# Patient Record
Sex: Female | Born: 1960 | Race: Black or African American | Hispanic: No | Marital: Single | Smoking: Never smoker
Health system: Southern US, Community
[De-identification: ages and names within clinical notes are randomized; demographics above are authoritative.]

## PROBLEM LIST (undated history)

## (undated) DIAGNOSIS — M549 Dorsalgia, unspecified: Secondary | ICD-10-CM

## (undated) DIAGNOSIS — I10 Essential (primary) hypertension: Secondary | ICD-10-CM

## (undated) DIAGNOSIS — K0889 Other specified disorders of teeth and supporting structures: Secondary | ICD-10-CM

## (undated) DIAGNOSIS — O1490 Unspecified pre-eclampsia, unspecified trimester: Secondary | ICD-10-CM

## (undated) DIAGNOSIS — K5900 Constipation, unspecified: Secondary | ICD-10-CM

## (undated) DIAGNOSIS — E785 Hyperlipidemia, unspecified: Secondary | ICD-10-CM

## (undated) DIAGNOSIS — E669 Obesity, unspecified: Secondary | ICD-10-CM

## (undated) DIAGNOSIS — I4891 Unspecified atrial fibrillation: Secondary | ICD-10-CM

## (undated) DIAGNOSIS — R7303 Prediabetes: Secondary | ICD-10-CM

## (undated) DIAGNOSIS — J45909 Unspecified asthma, uncomplicated: Secondary | ICD-10-CM

## (undated) DIAGNOSIS — R079 Chest pain, unspecified: Secondary | ICD-10-CM

## (undated) DIAGNOSIS — G510 Bell's palsy: Secondary | ICD-10-CM

## (undated) DIAGNOSIS — K219 Gastro-esophageal reflux disease without esophagitis: Secondary | ICD-10-CM

## (undated) DIAGNOSIS — R7989 Other specified abnormal findings of blood chemistry: Secondary | ICD-10-CM

## (undated) DIAGNOSIS — Z6841 Body Mass Index (BMI) 40.0 and over, adult: Secondary | ICD-10-CM

## (undated) DIAGNOSIS — S0230XA Fracture of orbital floor, unspecified side, initial encounter for closed fracture: Secondary | ICD-10-CM

## (undated) HISTORY — PX: COLONOSCOPY: SHX174

## (undated) HISTORY — DX: Bell's palsy: G51.0

## (undated) HISTORY — DX: Unspecified pre-eclampsia, unspecified trimester: O14.90

## (undated) HISTORY — DX: Essential (primary) hypertension: I10

## (undated) HISTORY — PX: BACK SURGERY: SHX140

## (undated) HISTORY — DX: Hyperlipidemia, unspecified: E78.5

## (undated) HISTORY — DX: Chest pain, unspecified: R07.9

## (undated) HISTORY — DX: Constipation, unspecified: K59.00

## (undated) HISTORY — DX: Prediabetes: R73.03

## (undated) HISTORY — PX: VAGINAL HYSTERECTOMY: SHX2639

## (undated) HISTORY — DX: Fracture of orbital floor, unspecified side, initial encounter for closed fracture: S02.30XA

## (undated) HISTORY — DX: Body Mass Index (BMI) 40.0 and over, adult: Z684

## (undated) HISTORY — DX: Unspecified asthma, uncomplicated: J45.909

## (undated) HISTORY — DX: Gastro-esophageal reflux disease without esophagitis: K21.9

## (undated) HISTORY — DX: Dorsalgia, unspecified: M54.9

## (undated) HISTORY — DX: Other specified abnormal findings of blood chemistry: R79.89

## (undated) HISTORY — PX: KNEE ARTHROSCOPY: SUR90

## (undated) HISTORY — DX: Obesity, unspecified: E66.9

## (undated) HISTORY — DX: Other specified disorders of teeth and supporting structures: K08.89

---

## 1997-11-26 ENCOUNTER — Emergency Department (HOSPITAL_COMMUNITY): Admission: EM | Admit: 1997-11-26 | Discharge: 1997-11-26 | Payer: Self-pay | Admitting: Emergency Medicine

## 1998-05-13 ENCOUNTER — Emergency Department (HOSPITAL_COMMUNITY): Admission: EM | Admit: 1998-05-13 | Discharge: 1998-05-13 | Payer: Self-pay | Admitting: Emergency Medicine

## 1998-08-29 ENCOUNTER — Emergency Department (HOSPITAL_COMMUNITY): Admission: EM | Admit: 1998-08-29 | Discharge: 1998-08-29 | Payer: Self-pay | Admitting: Emergency Medicine

## 1998-08-30 ENCOUNTER — Emergency Department (HOSPITAL_COMMUNITY): Admission: EM | Admit: 1998-08-30 | Discharge: 1998-08-31 | Payer: Self-pay | Admitting: Emergency Medicine

## 1998-10-27 ENCOUNTER — Other Ambulatory Visit: Admission: RE | Admit: 1998-10-27 | Discharge: 1998-10-27 | Payer: Self-pay | Admitting: Obstetrics and Gynecology

## 1998-12-15 ENCOUNTER — Encounter: Payer: Self-pay | Admitting: Occupational Medicine

## 1998-12-15 ENCOUNTER — Ambulatory Visit: Admission: RE | Admit: 1998-12-15 | Discharge: 1998-12-15 | Payer: Self-pay | Admitting: Occupational Medicine

## 1999-01-20 ENCOUNTER — Encounter: Payer: Self-pay | Admitting: Obstetrics and Gynecology

## 1999-01-20 ENCOUNTER — Encounter: Admission: RE | Admit: 1999-01-20 | Discharge: 1999-01-20 | Payer: Self-pay | Admitting: Obstetrics and Gynecology

## 1999-02-13 ENCOUNTER — Emergency Department (HOSPITAL_COMMUNITY): Admission: EM | Admit: 1999-02-13 | Discharge: 1999-02-13 | Payer: Self-pay | Admitting: Emergency Medicine

## 1999-02-13 ENCOUNTER — Encounter: Payer: Self-pay | Admitting: Emergency Medicine

## 1999-06-27 ENCOUNTER — Emergency Department (HOSPITAL_COMMUNITY): Admission: EM | Admit: 1999-06-27 | Discharge: 1999-06-27 | Payer: Self-pay

## 2000-07-15 ENCOUNTER — Emergency Department (HOSPITAL_COMMUNITY): Admission: EM | Admit: 2000-07-15 | Discharge: 2000-07-15 | Payer: Self-pay | Admitting: Emergency Medicine

## 2000-10-16 ENCOUNTER — Emergency Department (HOSPITAL_COMMUNITY): Admission: EM | Admit: 2000-10-16 | Discharge: 2000-10-16 | Payer: Self-pay | Admitting: *Deleted

## 2000-11-06 ENCOUNTER — Other Ambulatory Visit: Admission: RE | Admit: 2000-11-06 | Discharge: 2000-11-06 | Payer: Self-pay | Admitting: Obstetrics and Gynecology

## 2001-10-26 ENCOUNTER — Emergency Department (HOSPITAL_COMMUNITY): Admission: EM | Admit: 2001-10-26 | Discharge: 2001-10-26 | Payer: Self-pay | Admitting: Emergency Medicine

## 2001-10-26 ENCOUNTER — Encounter: Payer: Self-pay | Admitting: Emergency Medicine

## 2002-02-09 ENCOUNTER — Emergency Department (HOSPITAL_COMMUNITY): Admission: EM | Admit: 2002-02-09 | Discharge: 2002-02-09 | Payer: Self-pay

## 2003-05-13 ENCOUNTER — Emergency Department (HOSPITAL_COMMUNITY): Admission: EM | Admit: 2003-05-13 | Discharge: 2003-05-14 | Payer: Self-pay | Admitting: Emergency Medicine

## 2004-01-23 ENCOUNTER — Emergency Department (HOSPITAL_COMMUNITY): Admission: EM | Admit: 2004-01-23 | Discharge: 2004-01-24 | Payer: Self-pay | Admitting: *Deleted

## 2004-05-16 ENCOUNTER — Encounter (INDEPENDENT_AMBULATORY_CARE_PROVIDER_SITE_OTHER): Payer: Self-pay | Admitting: *Deleted

## 2004-05-16 ENCOUNTER — Inpatient Hospital Stay (HOSPITAL_COMMUNITY): Admission: RE | Admit: 2004-05-16 | Discharge: 2004-05-18 | Payer: Self-pay | Admitting: Obstetrics and Gynecology

## 2004-05-21 ENCOUNTER — Inpatient Hospital Stay (HOSPITAL_COMMUNITY): Admission: AD | Admit: 2004-05-21 | Discharge: 2004-05-21 | Payer: Self-pay | Admitting: *Deleted

## 2004-11-21 ENCOUNTER — Encounter: Admission: RE | Admit: 2004-11-21 | Discharge: 2004-11-21 | Payer: Self-pay | Admitting: Orthopedic Surgery

## 2004-12-12 ENCOUNTER — Encounter: Admission: RE | Admit: 2004-12-12 | Discharge: 2004-12-12 | Payer: Self-pay | Admitting: Orthopedic Surgery

## 2005-06-19 ENCOUNTER — Emergency Department (HOSPITAL_COMMUNITY): Admission: EM | Admit: 2005-06-19 | Discharge: 2005-06-19 | Payer: Self-pay | Admitting: Emergency Medicine

## 2006-04-24 ENCOUNTER — Emergency Department (HOSPITAL_COMMUNITY): Admission: EM | Admit: 2006-04-24 | Discharge: 2006-04-24 | Payer: Self-pay | Admitting: Emergency Medicine

## 2006-04-28 ENCOUNTER — Emergency Department (HOSPITAL_COMMUNITY): Admission: EM | Admit: 2006-04-28 | Discharge: 2006-04-28 | Payer: Self-pay | Admitting: Family Medicine

## 2006-11-20 ENCOUNTER — Encounter: Admission: RE | Admit: 2006-11-20 | Discharge: 2006-11-20 | Payer: Self-pay | Admitting: Family Medicine

## 2008-05-24 ENCOUNTER — Encounter: Admission: RE | Admit: 2008-05-24 | Discharge: 2008-05-24 | Payer: Self-pay | Admitting: Family Medicine

## 2008-07-29 ENCOUNTER — Emergency Department (HOSPITAL_COMMUNITY): Admission: EM | Admit: 2008-07-29 | Discharge: 2008-07-29 | Payer: Self-pay | Admitting: Family Medicine

## 2009-04-03 ENCOUNTER — Emergency Department (HOSPITAL_COMMUNITY): Admission: EM | Admit: 2009-04-03 | Discharge: 2009-04-03 | Payer: Self-pay | Admitting: Emergency Medicine

## 2009-04-07 ENCOUNTER — Encounter: Admission: RE | Admit: 2009-04-07 | Discharge: 2009-04-07 | Payer: Self-pay | Admitting: Family Medicine

## 2009-05-11 ENCOUNTER — Encounter: Admission: RE | Admit: 2009-05-11 | Discharge: 2009-05-11 | Payer: Self-pay | Admitting: Family Medicine

## 2009-05-30 ENCOUNTER — Encounter: Admission: RE | Admit: 2009-05-30 | Discharge: 2009-05-30 | Payer: Self-pay | Admitting: Family Medicine

## 2009-08-14 ENCOUNTER — Emergency Department (HOSPITAL_COMMUNITY): Admission: EM | Admit: 2009-08-14 | Discharge: 2009-08-15 | Payer: Self-pay | Admitting: Emergency Medicine

## 2010-02-15 ENCOUNTER — Inpatient Hospital Stay (HOSPITAL_COMMUNITY)
Admission: RE | Admit: 2010-02-15 | Discharge: 2010-02-18 | Payer: Self-pay | Source: Home / Self Care | Attending: Orthopaedic Surgery | Admitting: Orthopaedic Surgery

## 2010-03-26 ENCOUNTER — Encounter: Payer: Self-pay | Admitting: Obstetrics and Gynecology

## 2010-05-15 LAB — CBC
HCT: 32.8 % — ABNORMAL LOW (ref 36.0–46.0)
HCT: 33.8 % — ABNORMAL LOW (ref 36.0–46.0)
HCT: 42.2 % (ref 36.0–46.0)
Hemoglobin: 10.8 g/dL — ABNORMAL LOW (ref 12.0–15.0)
Hemoglobin: 10.9 g/dL — ABNORMAL LOW (ref 12.0–15.0)
Hemoglobin: 13.5 g/dL (ref 12.0–15.0)
MCH: 30.4 pg (ref 26.0–34.0)
MCH: 30.7 pg (ref 26.0–34.0)
MCH: 29.6 pg (ref 26.0–34.0)
MCHC: 32.2 g/dL (ref 30.0–36.0)
MCHC: 32.9 g/dL (ref 30.0–36.0)
MCHC: 32 g/dL (ref 30.0–36.0)
MCV: 93.2 fL (ref 78.0–100.0)
MCV: 94.4 fL (ref 78.0–100.0)
MCV: 92.5 fL (ref 78.0–100.0)
Platelets: 239 10*3/uL (ref 150–400)
Platelets: 243 10*3/uL (ref 150–400)
Platelets: 327 10*3/uL (ref 150–400)
RBC: 3.52 MIL/uL — ABNORMAL LOW (ref 3.87–5.11)
RBC: 3.58 MIL/uL — ABNORMAL LOW (ref 3.87–5.11)
RBC: 4.56 MIL/uL (ref 3.87–5.11)
RDW: 12.5 % (ref 11.5–15.5)
RDW: 12.5 % (ref 11.5–15.5)
RDW: 12.5 % (ref 11.5–15.5)
WBC: 13.1 10*3/uL — ABNORMAL HIGH (ref 4.0–10.5)
WBC: 13.9 10*3/uL — ABNORMAL HIGH (ref 4.0–10.5)
WBC: 5.6 10*3/uL (ref 4.0–10.5)

## 2010-05-15 LAB — COMPREHENSIVE METABOLIC PANEL
ALT: 31 U/L (ref 0–35)
AST: 23 U/L (ref 0–37)
Albumin: 3.7 g/dL (ref 3.5–5.2)
Alkaline Phosphatase: 57 U/L (ref 39–117)
BUN: 15 mg/dL (ref 6–23)
CO2: 24 mEq/L (ref 19–32)
Calcium: 9.6 mg/dL (ref 8.4–10.5)
Chloride: 103 mEq/L (ref 96–112)
Creatinine, Ser: 0.86 mg/dL (ref 0.4–1.2)
GFR calc Af Amer: >60 mL/min (ref >60)
GFR calc non Af Amer: >60 mL/min (ref >60)
Glucose, Bld: 90 mg/dL (ref 70–99)
Potassium: 4.6 mEq/L (ref 3.5–5.1)
Sodium: 135 mEq/L (ref 135–145)
Total Bilirubin: 0.7 mg/dL (ref 0.3–1.2)
Total Protein: 7.2 g/dL (ref 6.0–8.3)

## 2010-05-15 LAB — URINALYSIS, ROUTINE W REFLEX MICROSCOPIC
Bilirubin Urine: NEGATIVE
Glucose, UA: NEGATIVE mg/dL
Hgb urine dipstick: NEGATIVE
Ketones, ur: NEGATIVE mg/dL
Nitrite: NEGATIVE
Protein, ur: NEGATIVE mg/dL
Specific Gravity, Urine: 1.019 (ref 1.005–1.030)
Urobilinogen, UA: 0.2 mg/dL (ref 0.0–1.0)
pH: 6.5 (ref 5.0–8.0)

## 2010-05-15 LAB — SURGICAL PCR SCREEN
MRSA, PCR: NEGATIVE
Staphylococcus aureus: NEGATIVE

## 2010-05-15 LAB — TYPE AND SCREEN
ABO/RH(D): O POS
Antibody Screen: NEGATIVE
Unit division: 0

## 2010-05-15 LAB — BASIC METABOLIC PANEL
BUN: 3 mg/dL — ABNORMAL LOW (ref 6–23)
BUN: 8 mg/dL (ref 6–23)
CO2: 25 mEq/L (ref 19–32)
CO2: 26 mEq/L (ref 19–32)
Calcium: 8.2 mg/dL — ABNORMAL LOW (ref 8.4–10.5)
Calcium: 8.7 mg/dL (ref 8.4–10.5)
Chloride: 104 mEq/L (ref 96–112)
Chloride: 106 mEq/L (ref 96–112)
Creatinine, Ser: 0.82 mg/dL (ref 0.4–1.2)
Creatinine, Ser: 0.85 mg/dL (ref 0.4–1.2)
GFR calc Af Amer: >60 mL/min (ref >60)
GFR calc Af Amer: >60 mL/min (ref >60)
GFR calc non Af Amer: >60 mL/min (ref >60)
GFR calc non Af Amer: >60 mL/min (ref >60)
Glucose, Bld: 115 mg/dL — ABNORMAL HIGH (ref 70–99)
Glucose, Bld: 146 mg/dL — ABNORMAL HIGH (ref 70–99)
Potassium: 4 mEq/L (ref 3.5–5.1)
Potassium: 4.4 mEq/L (ref 3.5–5.1)
Sodium: 134 mEq/L — ABNORMAL LOW (ref 135–145)
Sodium: 137 mEq/L (ref 135–145)

## 2010-05-15 LAB — POCT I-STAT 4, (NA,K, GLUC, HGB,HCT)
Glucose, Bld: 91 mg/dL (ref 70–99)
HCT: 36 % (ref 36.0–46.0)
Hemoglobin: 12.2 g/dL (ref 12.0–15.0)

## 2010-05-15 LAB — URINE MICROSCOPIC-ADD ON

## 2010-05-18 ENCOUNTER — Other Ambulatory Visit: Payer: Self-pay | Admitting: Family Medicine

## 2010-05-18 DIAGNOSIS — Z1231 Encounter for screening mammogram for malignant neoplasm of breast: Secondary | ICD-10-CM

## 2010-05-21 LAB — COMPREHENSIVE METABOLIC PANEL
AST: 20 U/L (ref 0–37)
Albumin: 3.3 g/dL — ABNORMAL LOW (ref 3.5–5.2)
Alkaline Phosphatase: 64 U/L (ref 39–117)
BUN: 15 mg/dL (ref 6–23)
GFR calc Af Amer: >60 mL/min (ref >60)
Potassium: 4.1 mEq/L (ref 3.5–5.1)
Sodium: 136 mEq/L (ref 135–145)
Total Protein: 7.2 g/dL (ref 6.0–8.3)

## 2010-05-21 LAB — URINALYSIS, ROUTINE W REFLEX MICROSCOPIC
Nitrite: NEGATIVE
Protein, ur: NEGATIVE mg/dL
Specific Gravity, Urine: 1.026 (ref 1.005–1.030)
Urobilinogen, UA: 0.2 mg/dL (ref 0.0–1.0)

## 2010-05-21 LAB — DIFFERENTIAL
Basophils Relative: 3 % — ABNORMAL HIGH (ref 0–1)
Monocytes Absolute: 0.5 10*3/uL (ref 0.1–1.0)
Monocytes Relative: 7 % (ref 3–12)
Neutro Abs: 5.6 10*3/uL (ref 1.7–7.7)

## 2010-05-21 LAB — WET PREP, GENITAL

## 2010-05-21 LAB — GC/CHLAMYDIA PROBE AMP, GENITAL: Chlamydia, DNA Probe: NEGATIVE

## 2010-05-21 LAB — CBC
HCT: 39.3 % (ref 36.0–46.0)
Platelets: 289 10*3/uL (ref 150–400)
RDW: 11.8 % (ref 11.5–15.5)

## 2010-05-21 LAB — GLUCOSE, CAPILLARY: Glucose-Capillary: 101 mg/dL — ABNORMAL HIGH (ref 70–99)

## 2010-06-01 ENCOUNTER — Ambulatory Visit
Admission: RE | Admit: 2010-06-01 | Discharge: 2010-06-01 | Disposition: A | Discharge status: Home / Self Care | Payer: BC Managed Care – PPO | Source: Ambulatory Visit | Attending: Family Medicine | Admitting: Family Medicine

## 2010-06-01 DIAGNOSIS — Z1231 Encounter for screening mammogram for malignant neoplasm of breast: Secondary | ICD-10-CM

## 2010-07-21 NOTE — Discharge Summary (Signed)
NAME:  Kovacich, Chakira           ACCOUNT NO.:  [id number]   MEDICAL RECORD NO.:  [id number]          PATIENT TYPE:  INP   LOCATION:  9309                          FACILITY:  [facility]   PHYSICIAN:  Lenoard Aden, M.D.DATE OF BIRTH:  01/14/1961   DATE OF ADMISSION:  05/16/2004  DATE OF DISCHARGE:  05/18/2004                                 DISCHARGE SUMMARY   ADMISSION DIAGNOSIS:  Symptomatic fibroids.   DISCHARGE DIAGNOSIS:  Symptomatic fibroids.   PROCEDURE:  TAH, lysis of adhesions, McCall culdoplasty.   HOSPITAL COURSE:  The patient underwent uncomplicated TAH, lysis of  adhesions, and McCall culdoplasty on May 16, 2004.  Postoperative course  uncomplicated.  Discharged to home on day #2.  Tolerated a regular diet  well.  Discharge teaching done.   DISCHARGE MEDICATIONS:  1.  Tylox.  2.  Motrin.   FOLLOW UP:  Follow up in the office in 4 days for staple removal.  Incision  care discussed.      RJT/MEDQ  D:  05/18/2004  T:  05/18/2004  Job:  [id number]

## 2010-07-21 NOTE — Op Note (Signed)
NAME:  Linda Olson, Linda Olson           ACCOUNT NO.:  [id number]   MEDICAL RECORD NO.:  [id number]          PATIENT TYPE:  INP   LOCATION:  9399                          FACILITY:  [facility]   PHYSICIAN:  Lenoard Aden, M.D.DATE OF BIRTH:  [birth year]   DATE OF PROCEDURE:  DATE OF DISCHARGE:                                 OPERATIVE REPORT   PREOPERATIVE DIAGNOSES:  1.  Menometrorrhagia.  2.  Symptomatic fibroids secondary to anemia.  3.  Pelvic adhesions.   POSTOPERATIVE DIAGNOSES:  1.  Menometrorrhagia.  2.  Symptomatic fibroids secondary to anemia.  3.  Pelvic adhesions.  4.  Enterocele.  5.  Pelvic adhesive disease.   PROCEDURE:  TAH, lysis of adhesions, McCall culdoplasty.   SURGEON:  Lenoard Aden, M.D.   ASSISTANT:  Chester Holstein. Earlene Plater, M.D.   ANESTHESIA:  Regional.   SPECIMENS:  Uterus, cervix.  Specimen weighed 500 g.   ESTIMATED BLOOD LOSS:  550 mL.   COMPLICATIONS:  None.   URINE OUTPUT:  250 mL.   DISPOSITION:  Recovery in good condition.   BRIEF OPERATIVE NOTE:  After being apprised of the risks of anesthesia,  infection, bleeding, injury to abdominal organs, __________ complications to  include bowel or bladder noted.  The patient was brought to the operating  room where she was administered a combined spinal epidural without  complications, prepped and draped in the usual sterile fashion.  A Foley  catheter was placed after achieving adequate anesthesia.  Dilute Marcaine  solution placed.  A Pfannenstiel skin incision made with the scalpel and  carried down to the fascia which was nicked in the midline and entered  transversely using Mayo scissors.  The rectus muscles were dissected sharply  in the midline.  The peritoneum entered sharply.  After entering the  peritoneal cavity, it was noted there was extensive adhesions in the right  and left adnexa.  The ovaries are adhered to the fundus of the uterus and to  the lateral pelvic sidewall.  These adhesions  are lysed sharply using  Metzenbaum scissors.  Sharp dissection down to restore the otherwise normal  anatomy of the 16-18 week sized fibroid uterus which is then identified.  Balfour retractors placed.  The bowel is packed off using moist lap packs.  Both ovaries are identified and found to be normal.  Both ureters are  identified bilaterally.  The round ligaments are bilaterally clamped and  suture ligated.  Retroperitoneal space is entered and the tubo-ovarian  ligament on both sides are clamped and suture ligated. Both ovaries are  identified as being normal and previous tubal site is identified.  These  areas are then packed off with the bowel.  The uterine vessels were then  skeletonized bilaterally and cauterized using the LigaSure.  The specimen  was removed partially at the level of the internal cervical os.  At this  time, the bladder flap having been sharply developed previously was further  sharply developed off the lower uterine and cervical segments and please  note that this was done prior to removal of the specimen.   At this time, the  cardinal and broad ligament complexes were ligated using  LigaSure.  Uterosacrals were taken separately using Haney clamps and  bilaterally suture ligated and held.  The vagina was entered sharply and  specimen of the cervix was removed intact.  The vagina was closed using two  interrupted figure-of-eight.  The vaginal angles were tied to the  uterosacral ligaments and McCall culdoplasty stitch was placed using 0  Vicryl suture.  At this time, irrigation was accomplished.  Good hemostasis  was noted.  Ovaries were inspected and found to be hemostatic.  All packs  and Balfour retractor removed.  The fascia was closed using a 0 Monocryl  __________ running fashion.  The skin closed using staples.  The patient  tolerated the procedure well and was transferred to recovery room in good  condition.      RJT/MEDQ  D:  05/16/2004  T:  05/16/2004   Job:  [id number]   cc:   Ma Hillock

## 2011-10-31 ENCOUNTER — Encounter (HOSPITAL_COMMUNITY): Payer: Self-pay | Admitting: *Deleted

## 2011-10-31 ENCOUNTER — Emergency Department (HOSPITAL_COMMUNITY): Payer: No Typology Code available for payment source

## 2011-10-31 ENCOUNTER — Emergency Department (HOSPITAL_COMMUNITY)
Admission: EM | Admit: 2011-10-31 | Discharge: 2011-11-01 | Disposition: A | Discharge status: Home / Self Care | Payer: No Typology Code available for payment source | Attending: Emergency Medicine | Admitting: Emergency Medicine

## 2011-10-31 DIAGNOSIS — M542 Cervicalgia: Secondary | ICD-10-CM

## 2011-10-31 DIAGNOSIS — N644 Mastodynia: Secondary | ICD-10-CM

## 2011-10-31 DIAGNOSIS — E669 Obesity, unspecified: Secondary | ICD-10-CM | POA: Insufficient documentation

## 2011-10-31 DIAGNOSIS — Y92410 Unspecified street and highway as the place of occurrence of the external cause: Secondary | ICD-10-CM | POA: Insufficient documentation

## 2011-10-31 MED ORDER — OXYCODONE-ACETAMINOPHEN 5-325 MG PO TABS
2.0000 | ORAL_TABLET | Freq: Once | ORAL | Status: AC
  Administered 2011-10-31: 2 via ORAL
  Filled 2011-10-31: qty 2

## 2011-10-31 MED ORDER — ONDANSETRON 4 MG PO TBDP
4.0000 mg | ORAL_TABLET | Freq: Once | ORAL | Status: AC
  Administered 2011-10-31: 4 mg via ORAL
  Filled 2011-10-31: qty 1

## 2011-10-31 MED ORDER — HYDROMORPHONE HCL PF 1 MG/ML IJ SOLN
1.0000 mg | Freq: Once | INTRAMUSCULAR | Status: AC
  Administered 2011-11-01: 1 mg via INTRAMUSCULAR
  Filled 2011-10-31: qty 1

## 2011-10-31 MED ORDER — OXYCODONE-ACETAMINOPHEN 5-325 MG PO TABS: 2.0000 | ORAL_TABLET | Freq: Four times a day (QID) | ORAL | Status: AC | PRN

## 2011-10-31 MED ORDER — IBUPROFEN 800 MG PO TABS: 800.0000 mg | ORAL_TABLET | Freq: Three times a day (TID) | ORAL | Status: AC | PRN

## 2011-10-31 NOTE — ED Notes (Signed)
Pt transported to procedure.
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
## 2011-10-31 NOTE — ED Notes (Signed)
MD made aware of pts pain. 

## 2011-11-12 ENCOUNTER — Other Ambulatory Visit: Payer: Self-pay | Admitting: Family Medicine

## 2011-11-12 DIAGNOSIS — Z1231 Encounter for screening mammogram for malignant neoplasm of breast: Secondary | ICD-10-CM

## 2011-11-13 ENCOUNTER — Ambulatory Visit
Admission: RE | Admit: 2011-11-13 | Discharge: 2011-11-13 | Disposition: A | Discharge status: Home / Self Care | Payer: BC Managed Care – PPO | Source: Ambulatory Visit | Attending: Family Medicine | Admitting: Family Medicine

## 2011-11-13 DIAGNOSIS — Z1231 Encounter for screening mammogram for malignant neoplasm of breast: Secondary | ICD-10-CM

## 2011-11-14 ENCOUNTER — Other Ambulatory Visit: Payer: Self-pay | Admitting: Family Medicine

## 2011-11-14 DIAGNOSIS — N63 Unspecified lump in unspecified breast: Secondary | ICD-10-CM

## 2011-11-20 ENCOUNTER — Ambulatory Visit
Admission: RE | Admit: 2011-11-20 | Discharge: 2011-11-20 | Disposition: A | Discharge status: Home / Self Care | Payer: BC Managed Care – PPO | Source: Ambulatory Visit | Attending: Family Medicine | Admitting: Family Medicine

## 2011-11-20 ENCOUNTER — Other Ambulatory Visit: Payer: Self-pay | Admitting: Family Medicine

## 2011-11-20 DIAGNOSIS — N63 Unspecified lump in unspecified breast: Secondary | ICD-10-CM

## 2011-12-03 ENCOUNTER — Inpatient Hospital Stay: Admission: RE | Admit: 2011-12-03 | Payer: BC Managed Care – PPO | Source: Ambulatory Visit

## 2011-12-04 ENCOUNTER — Ambulatory Visit
Admission: RE | Admit: 2011-12-04 | Discharge: 2011-12-04 | Disposition: A | Discharge status: Home / Self Care | Payer: BC Managed Care – PPO | Source: Ambulatory Visit | Attending: Family Medicine | Admitting: Family Medicine

## 2011-12-04 DIAGNOSIS — N63 Unspecified lump in unspecified breast: Secondary | ICD-10-CM

## 2012-03-21 ENCOUNTER — Other Ambulatory Visit: Payer: Self-pay | Admitting: Family Medicine

## 2012-03-21 DIAGNOSIS — R1030 Lower abdominal pain, unspecified: Secondary | ICD-10-CM

## 2012-03-24 ENCOUNTER — Ambulatory Visit
Admission: RE | Admit: 2012-03-24 | Discharge: 2012-03-24 | Disposition: A | Discharge status: Home / Self Care | Payer: BC Managed Care – PPO | Source: Ambulatory Visit | Attending: Family Medicine | Admitting: Family Medicine

## 2012-03-24 DIAGNOSIS — R1030 Lower abdominal pain, unspecified: Secondary | ICD-10-CM

## 2012-03-24 MED ORDER — IOHEXOL 300 MG/ML  SOLN
125.0000 mL | Freq: Once | INTRAMUSCULAR | Status: AC | PRN
  Administered 2012-03-24: 125 mL via INTRAVENOUS

## 2013-02-08 ENCOUNTER — Emergency Department (HOSPITAL_COMMUNITY)
Admission: EM | Admit: 2013-02-08 | Discharge: 2013-02-08 | Disposition: A | Discharge status: Home / Self Care | Payer: BC Managed Care – PPO | Attending: Emergency Medicine | Admitting: Emergency Medicine

## 2013-02-08 ENCOUNTER — Encounter (HOSPITAL_COMMUNITY): Payer: Self-pay | Admitting: Emergency Medicine

## 2013-02-08 DIAGNOSIS — J069 Acute upper respiratory infection, unspecified: Secondary | ICD-10-CM | POA: Insufficient documentation

## 2013-02-08 DIAGNOSIS — J029 Acute pharyngitis, unspecified: Secondary | ICD-10-CM | POA: Insufficient documentation

## 2013-02-08 DIAGNOSIS — R05 Cough: Secondary | ICD-10-CM

## 2013-02-08 DIAGNOSIS — R059 Cough, unspecified: Secondary | ICD-10-CM | POA: Insufficient documentation

## 2013-02-08 DIAGNOSIS — R509 Fever, unspecified: Secondary | ICD-10-CM | POA: Insufficient documentation

## 2013-02-08 LAB — RAPID STREP SCREEN (MED CTR MEBANE ONLY): Streptococcus, Group A Screen (Direct): NEGATIVE

## 2013-02-08 MED ORDER — BENZONATATE 100 MG PO CAPS
200.0000 mg | ORAL_CAPSULE | Freq: Three times a day (TID) | ORAL | Status: DC | PRN
  Administered 2013-02-08: 200 mg via ORAL
  Filled 2013-02-08: qty 2

## 2013-02-08 MED ORDER — PSEUDOEPHEDRINE HCL ER 120 MG PO TB12: 120.0000 mg | ORAL_TABLET | Freq: Two times a day (BID) | ORAL | Status: DC

## 2013-02-08 MED ORDER — PSEUDOEPHEDRINE HCL ER 120 MG PO TB12
120.0000 mg | ORAL_TABLET | Freq: Two times a day (BID) | ORAL | Status: DC
  Administered 2013-02-08: 120 mg via ORAL
  Filled 2013-02-08 (×2): qty 1

## 2013-02-08 MED ORDER — BENZONATATE 200 MG PO CAPS: 200.0000 mg | ORAL_CAPSULE | Freq: Three times a day (TID) | ORAL | Status: DC | PRN

## 2013-02-08 MED ORDER — GUAIFENESIN 100 MG/5ML PO SOLN
5.0000 mL | Freq: Once | ORAL | Status: AC
  Administered 2013-02-08: 100 mg via ORAL
  Filled 2013-02-08: qty 5

## 2013-02-08 MED ORDER — HYDROCOD POLST-CHLORPHEN POLST 10-8 MG/5ML PO LQCR: 5.0000 mL | Freq: Two times a day (BID) | ORAL | Status: DC | PRN

## 2013-02-08 NOTE — ED Notes (Signed)
Patient is alert and oriented x3.  She is complaining of a cough and a soar throat that started last Tuesday. She has been taking mucinex with little relief.  Currently she rates her pain 8 of 10.  She denies being around Anyone that was sick.

## 2013-02-08 NOTE — ED Provider Notes (Signed)
CSN: [id number]     Arrival date & time 02/08/13  2440 History   First MD Initiated Contact with Patient 02/08/13 0331     Chief Complaint  Patient presents with  . Cough  . Sore Throat   (Consider location/radiation/quality/duration/timing/severity/associated sxs/prior Treatment) HPI 52 year old female presents to emergency room from home with complaint of tics days of cough, congestion, sore throat.  She reports she had some fevers at the beginning of the illness, none for the last 4 days.  She's been taking recent X. over-the-counter without improvement.  Patient denies that she has prior lung problems, she is a smoker.  She did not receive a flu shot.  No known sick contacts.  He should complaining of pain with deep breathing and cough secondary to heavy coughing  History reviewed. No pertinent past medical history. Past Surgical History  Procedure Laterality Date  . Back surgery    . Vaginal hysterectomy     History reviewed. No pertinent family history. History  Substance Use Topics  . Smoking status: Never Smoker   . Smokeless tobacco: Not on file  . Alcohol Use: No   OB History   Grav Para Term Preterm Abortions TAB SAB Ect Mult Living                 Review of Systems  All other systems reviewed and are negative.    Allergies  Review of patient's allergies indicates no known allergies.  Home Medications   Current Outpatient Rx  Name  Route  Sig  Dispense  Refill  . Aspirin-Caffeine 845-65 MG PACK   Oral   Take 1 Package by mouth every 6 (six) hours as needed (pain).         . pseudoephedrine-guaifenesin (MUCINEX D) 60-600 MG per tablet   Oral   Take 1 tablet by mouth every 12 (twelve) hours.         . traMADol (ULTRAM) 50 MG tablet   Oral   Take 50 mg by mouth every 6 (six) hours as needed (pain).         . benzonatate (TESSALON) 200 MG capsule   Oral   Take 1 capsule (200 mg total) by mouth 3 (three) times daily as needed for cough.   20  capsule   0   . chlorpheniramine-HYDROcodone (TUSSIONEX PENNKINETIC ER) 10-8 MG/5ML LQCR   Oral   Take 5 mLs by mouth every 12 (twelve) hours as needed for cough.   115 mL   0   . pseudoephedrine (SUDAFED) 120 MG 12 hr tablet   Oral   Take 1 tablet (120 mg total) by mouth 2 (two) times daily.   30 tablet   0    BP 153/73  Pulse 64  Temp(Src) 98.1 F (36.7 C) (Oral)  Resp 15  Ht 4\' 11"  (1.499 m)  Wt 220 lb (99.791 kg)  BMI 44.41 kg/m2  SpO2 96% Physical Exam  Nursing note and vitals reviewed. Constitutional: She is oriented to person, place, and time. She appears well-developed and well-nourished. No distress.  HENT:  Head: Normocephalic and atraumatic.  Right Ear: External ear normal.  Left Ear: External ear normal.  Mouth/Throat: Oropharynx is clear and moist.  Nasal congestion noted  Eyes: Conjunctivae and EOM are normal. Pupils are equal, round, and reactive to light.  Neck: Normal range of motion. Neck supple. No JVD present. No tracheal deviation present. No thyromegaly present.  Cardiovascular: Normal rate, regular rhythm, normal heart sounds and intact distal  pulses.  Exam reveals no gallop and no friction rub.   No murmur heard. Pulmonary/Chest: Effort normal and breath sounds normal. No stridor. No respiratory distress. She has no wheezes. She has no rales. She exhibits no tenderness.  Cough present  Abdominal: Soft. Bowel sounds are normal. She exhibits no distension and no mass. There is no tenderness. There is no rebound and no guarding.  Musculoskeletal: Normal range of motion. She exhibits no edema and no tenderness.  Lymphadenopathy:    She has no cervical adenopathy.  Neurological: She is alert and oriented to person, place, and time. She exhibits normal muscle tone. Coordination normal.  Skin: Skin is warm and dry. No rash noted. No erythema. No pallor.  Psychiatric: She has a normal mood and affect. Her behavior is normal. Judgment and thought content  normal.    ED Course  Procedures (including critical care time) Labs Review Labs Reviewed  RAPID STREP SCREEN   Imaging Review No results found.  EKG Interpretation   None       MDM   1. Upper respiratory infection   2. Cough    35-year-old female with 6 days of cough, and upper his primary symptoms.  Exam benign.  No signs of wheezing or rhonchi.  On exam.  No signs of erythema or exudate.  Will treat symptomatically.    Olivia Mackie, MD 02/08/13 [phone]

## 2013-02-10 LAB — CULTURE, GROUP A STREP

## 2013-03-02 ENCOUNTER — Other Ambulatory Visit: Payer: Self-pay

## 2013-03-02 DIAGNOSIS — Z1231 Encounter for screening mammogram for malignant neoplasm of breast: Secondary | ICD-10-CM

## 2013-03-23 ENCOUNTER — Ambulatory Visit
Admission: RE | Admit: 2013-03-23 | Discharge: 2013-03-23 | Disposition: A | Discharge status: Home / Self Care | Payer: BC Managed Care – PPO | Source: Ambulatory Visit

## 2013-03-23 DIAGNOSIS — Z1231 Encounter for screening mammogram for malignant neoplasm of breast: Secondary | ICD-10-CM

## 2013-03-30 ENCOUNTER — Emergency Department (HOSPITAL_COMMUNITY)
Admission: EM | Admit: 2013-03-30 | Discharge: 2013-03-31 | Disposition: A | Discharge status: Home / Self Care | Payer: BC Managed Care – PPO | Attending: Emergency Medicine | Admitting: Emergency Medicine

## 2013-03-30 ENCOUNTER — Emergency Department (HOSPITAL_COMMUNITY): Payer: BC Managed Care – PPO

## 2013-03-30 ENCOUNTER — Encounter (HOSPITAL_COMMUNITY): Payer: Self-pay | Admitting: Emergency Medicine

## 2013-03-30 DIAGNOSIS — Y9239 Other specified sports and athletic area as the place of occurrence of the external cause: Secondary | ICD-10-CM | POA: Insufficient documentation

## 2013-03-30 DIAGNOSIS — S0990XA Unspecified injury of head, initial encounter: Secondary | ICD-10-CM

## 2013-03-30 DIAGNOSIS — S0285XA Fracture of orbit, unspecified, initial encounter for closed fracture: Secondary | ICD-10-CM

## 2013-03-30 DIAGNOSIS — W208XXA Other cause of strike by thrown, projected or falling object, initial encounter: Secondary | ICD-10-CM | POA: Insufficient documentation

## 2013-03-30 DIAGNOSIS — S0280XA Fracture of other specified skull and facial bones, unspecified side, initial encounter for closed fracture: Secondary | ICD-10-CM | POA: Insufficient documentation

## 2013-03-30 DIAGNOSIS — Y92838 Other recreation area as the place of occurrence of the external cause: Secondary | ICD-10-CM

## 2013-03-30 DIAGNOSIS — Y9389 Activity, other specified: Secondary | ICD-10-CM | POA: Insufficient documentation

## 2013-03-30 MED ORDER — MORPHINE SULFATE 4 MG/ML IJ SOLN
4.0000 mg | Freq: Once | INTRAMUSCULAR | Status: AC
Start: 2013-03-30 — End: 2013-03-30
  Administered 2013-03-30: 4 mg via INTRAMUSCULAR
  Filled 2013-03-30: qty 1
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
## 2013-03-30 NOTE — ED Notes (Signed)
Writer provided pt with an ice bag to the (R) eye.

## 2013-03-30 NOTE — ED Provider Notes (Signed)
TIME SEEN: 10:10 PM  CHIEF COMPLAINT: Head injury  HPI: Patient is a 53 year old female with no significant past medical history who presents emergency department with head injury. Patient reports that she was working at the gym with a weight dropped and hit her in the right eye. She has had blood coming of her mouth and nose. She denies a loss of consciousness. No numbness, tingling or focal weakness. She denies any other injury. She is not on anticoagulation.  ROS: See HPI Constitutional: no fever  Eyes: no drainage  ENT: no runny nose   Cardiovascular:  no chest pain  Resp: no SOB  GI: no vomiting GU: no dysuria Integumentary: no rash  Allergy: no hives  Musculoskeletal: no leg swelling  Neurological: no slurred speech ROS otherwise negative  PAST MEDICAL HISTORY/PAST SURGICAL HISTORY:  History reviewed. No pertinent past medical history.  MEDICATIONS:  Prior to Admission medications   Medication Sig Start Date End Date Taking? Authorizing Provider  benzonatate (TESSALON) 200 MG capsule Take 1 capsule (200 mg total) by mouth 3 (three) times daily as needed for cough. 02/08/13  Yes Kalman Drape, MD    ALLERGIES:  No Known Allergies  SOCIAL HISTORY:  History  Substance Use Topics  . Smoking status: Never Smoker   . Smokeless tobacco: Not on file  . Alcohol Use: No    FAMILY HISTORY: History reviewed. No pertinent family history.  EXAM: BP 141/108  Pulse 90  Temp(Src) 97.8 F (36.6 C) (Oral)  Resp 20  Ht 5' (1.524 m)  Wt 220 lb (99.791 kg)  BMI 42.97 kg/m2  SpO2 97% CONSTITUTIONAL: Alert and oriented and responds appropriately to questions. Well-appearing; well-nourished; GCS 15 HEAD: Normocephalic; right periorbital ecchymosis and swelling EYES: Conjunctivae clear, PERRL, mild chemosis of the lateral right eye, no hyphema or subconjunctival hemorrhage, patient has decreased upward movement of her right eye ENT: normal nose; no rhinorrhea; moist mucous  membranes; pharynx without lesions noted; no dental injury; no hemotypanum; no septal hematoma NECK: Supple, no meningismus, no LAD; no midline spinal tenderness, step-off or deformity CARD: RRR; S1 and S2 appreciated; no murmurs, no clicks, no rubs, no gallops RESP: Normal chest excursion without splinting or tachypnea; breath sounds clear and equal bilaterally; no wheezes, no rhonchi, no rales; chest wall stable, nontender to palpation ABD/GI: Normal bowel sounds; non-distended; soft, non-tender, no rebound, no guarding PELVIS:  stable, nontender to palpation BACK:  The back appears normal and is non-tender to palpation, there is no CVA tenderness; no midline spinal tenderness, step-off or deformity EXT: Normal ROM in all joints; non-tender to palpation; no edema; normal capillary refill; no cyanosis    SKIN: Normal color for age and race; warm NEURO: Moves all extremities equally, no facial droop, no slurred speech, sensation to light touch intact diffusely PSYCH: The patient's mood and manner are appropriate. Grooming and personal hygiene are appropriate.  MEDICAL DECISION MAKING: Patient here with right eye injury. Concern for possible entrapment and a blowout orbital fracture. We'll obtain CT head, cervical spine and face. We'll give pain medication. She reports she feels her vision is normal when her eyelids manually opened. She does appear to have some entrapment and is unable to look upwards with her right eye.  ED PROGRESS: Upon reevaluation, patient has normal extraocular movements bilaterally. No concern for entrapment. Suspect her initial exam was secondary to pain and apprehension. CT head and cervical spine are negative. CT face shows fractured right medial orbital wall with herniation of extraconal  fat and medial rectus muscle herniates toward the defect. There's also possible nondisplaced right lateral orbital wall fracture and nondisplaced right maxillary sinus fracture. Discussed  with Dr. Erik Obey with ENT who recommends a soft diet, no nose blowing, pain control, elevation of head of bed, ice packs, followup in 4-5 days with ENT and with ophthalmology. Patient reports she has her own ophthalmologist. Will DC with pain medication. Discussed return precautions. Patient and/or her bedside verbalize understanding and are comfortable with plan.     [place], DO 03/31/13 [phone]

## 2013-03-31 MED ORDER — OXYCODONE-ACETAMINOPHEN 5-325 MG PO TABS: 1.0000 | ORAL_TABLET | ORAL | Status: DC | PRN

## 2013-03-31 MED ORDER — HYDROMORPHONE HCL PF 1 MG/ML IJ SOLN
1.0000 mg | Freq: Once | INTRAMUSCULAR | Status: AC
  Administered 2013-03-31: 1 mg via INTRAMUSCULAR
  Filled 2013-03-31: qty 1

## 2013-03-31 NOTE — Discharge Instructions (Signed)
Facial Fracture A facial fracture is a break in one of the bones of your face. HOME CARE INSTRUCTIONS   Protect the injured part of your face until it is healed.  Do not participate in activities which give chance for re-injury until your doctor approves.  Gently wash and dry your face.  Wear head and facial protection while riding a bicycle, motorcycle, or snowmobile. SEEK MEDICAL CARE IF:   An oral temperature above 102 F (38.9 C) develops.  You have severe headaches or notice changes in your vision.  You have new numbness or tingling in your face.  You develop nausea (feeling sick to your stomach), vomiting or a stiff neck. SEEK IMMEDIATE MEDICAL CARE IF:   You develop difficulty seeing or experience double vision.  You become dizzy, lightheaded, or faint.  You develop trouble speaking, breathing, or swallowing.  You have a watery discharge from your nose or ear. MAKE SURE YOU:   Understand these instructions.  Will watch your condition.  Will get help right away if you are not doing well or get worse. Document Released: 02/19/2005 Document Revised: 05/14/2011 Document Reviewed: 10/09/2007 [facility] Patient Information 2014 [place].  Head Injury, Adult You have received a head injury. It does not appear serious at this time. Headaches and vomiting are common following head injury. It should be easy to awaken from sleeping. Sometimes it is necessary for you to stay in the emergency department for a while for observation. Sometimes admission to the hospital may be needed. After injuries such as yours, most problems occur within the first 24 hours, but side effects may occur up to 7 10 days after the injury. It is important for you to carefully monitor your condition and contact your health care provider or seek immediate medical care if there is a change in your condition. WHAT ARE THE TYPES OF HEAD INJURIES? Head injuries can be as minor as a bump. Some head  injuries can be more severe. More severe head injuries include:  A jarring injury to the brain (concussion).  A bruise of the brain (contusion). This mean there is bleeding in the brain that can cause swelling.  A cracked skull (skull fracture).  Bleeding in the brain that collects, clots, and forms a bump (hematoma). WHAT CAUSES A HEAD INJURY? A serious head injury is most likely to happen to someone who is in a car wreck and is not wearing a seat belt. Other causes of major head injuries include bicycle or motorcycle accidents, sports injuries, and falls. HOW ARE HEAD INJURIES DIAGNOSED? A complete history of the event leading to the injury and your current symptoms will be helpful in diagnosing head injuries. Many times, pictures of the brain, such as CT or MRI are needed to see the extent of the injury. Often, an overnight hospital stay is necessary for observation.  WHEN SHOULD I SEEK IMMEDIATE MEDICAL CARE?  You should get help right away if:  You have confusion or drowsiness.  You feel sick to your stomach (nauseous) or have continued, forceful vomiting.  You have dizziness or unsteadiness that is getting worse.  You have severe, continued headaches not relieved by medicine. Only take over-the-counter or prescription medicines for pain, fever, or discomfort as directed by your health care provider.  You do not have normal function of the arms or legs or are unable to walk.  You notice changes in the black spots in the center of the colored part of your eye (pupil).  You  have a clear or bloody fluid coming from your nose or ears.  You have a loss of vision. During the next 24 hours after the injury, you must stay with someone who can watch you for the warning signs. This person should contact local emergency services (911 in the [place] if you have seizures, you become unconscious, or you are unable to wake up. HOW CAN I PREVENT A HEAD INJURY IN THE FUTURE? The most important  factor for preventing major head injuries is avoiding motor vehicle accidents. To minimize the potential for damage to your head, it is crucial to wear seat belts while riding in motor vehicles. Wearing helmets while bike riding and playing collision sports (like football) is also helpful. Also, avoiding dangerous activities around the house will further help reduce your risk of head injury.  WHEN CAN I RETURN TO NORMAL ACTIVITIES AND ATHLETICS? You should be reevaluated by your health care provider before returning to these activities. If you have any of the following symptoms, you should not return to activities or contact sports until 1 week after the symptoms have stopped:  Persistent headache.  Dizziness or vertigo.  Poor attention and concentration.  Confusion.  Memory problems.  Nausea or vomiting.  Fatigue or tire easily.  Irritability.  Intolerant of bright lights or loud noises.  Anxiety or depression.  Disturbed sleep. MAKE SURE YOU:   Understand these instructions.  Will watch your condition.  Will get help right away if you are not doing well or get worse. Document Released: 02/19/2005 Document Revised: 12/10/2012 Document Reviewed: 10/27/2012 [facility] Patient Information 2014 [place].    Please follow up with your ophthalmologist and ENT the end of this week or the beginning of next. Please call to schedule an appointment tomorrow. Please continue to keep your head elevated above the level of your heart at all times even when sleeping. Continue to apply ice packs to your face several times a day for 15-20 minutes at a time. Please do not blow your nose until you have been cleared by ENT. If you develop severe headache, numbness or weakness on one side your body, vomiting, uncontrolled pain, please return to the emergency department.

## 2013-08-13 ENCOUNTER — Ambulatory Visit: Payer: BC Managed Care – PPO | Admitting: Dietician

## 2013-09-30 ENCOUNTER — Ambulatory Visit: Payer: BC Managed Care – PPO | Admitting: Dietician

## 2013-11-12 ENCOUNTER — Ambulatory Visit: Payer: BC Managed Care – PPO | Admitting: Dietician

## 2013-12-29 ENCOUNTER — Encounter: Payer: BC Managed Care – PPO | Attending: Family Medicine | Admitting: Dietician

## 2013-12-29 ENCOUNTER — Encounter: Payer: Self-pay | Admitting: Dietician

## 2013-12-29 VITALS — Ht 60.0 in | Wt 232.1 lb

## 2013-12-29 DIAGNOSIS — Z713 Dietary counseling and surveillance: Secondary | ICD-10-CM | POA: Insufficient documentation

## 2013-12-29 DIAGNOSIS — Z6841 Body Mass Index (BMI) 40.0 and over, adult: Secondary | ICD-10-CM | POA: Diagnosis not present

## 2013-12-29 DIAGNOSIS — E669 Obesity, unspecified: Secondary | ICD-10-CM | POA: Diagnosis present

## 2013-12-29 NOTE — Progress Notes (Signed)
  Medical Nutrition Therapy:  Appt start time: 1000 end time:  1100.   Assessment:  Primary concerns today: Linda Olson is here today since her doctor recommended that she talk to a dietitian since she has pre-diabetes. Saw her doctor in April and Hgb A1c was 6.2%. Vitamin D was was very low (8.4) and had a prescription but did not fill it since she doesn't have the money to fill it.   In the past 2 weeks ago stopped eating red meats, has stopped adding sugar to foods, stopped frying foods, and is choosing food with less sugar. Less snacking than before.  [facility] to the [employer] and exercises 3 x week (weights and cardio).  Had previously weighed weighed 260 lbs awhile ago. Has lost weight with Weight [employer] in the past. Weight has been stable since April.   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 Dinner can be late at night. Lives with daughter and 49 year old grandchild. Shares meal preparation with her daughter. Eats out on lunch on Friday.  Goes to bed at 1-2 AM and get up at 6:00 AM for the past year.   Preferred Learning Style:   No preference indicated   Learning Readiness:   Ready  MEDICATIONS: none   DIETARY INTAKE:  Usual eating pattern includes 3 meals and 1 snacks per day.  Avoided foods include: red meat, sugar, potato salad, peas, asparagus   24-hr recall:  B ( AM): Cheerios with whole milk   Snk ( AM): oatmeal from [employer]  L ( PM): baked chicken with vegetable or sandwich with [place] on whole wheat or salad Snk ( PM):none D ( PM): baked chicken with vegetable and starch or sandwich Snk ( PM): none Beverages: water, 1 cup of coffee  Usual physical activity: 3 x week weight and cardio  Estimated energy needs: 1600 calories 180 g carbohydrates 120 g protein 44 g fat  Progress Towards Goal(s):  In progress.   Nutritional Diagnosis:  -3.3 Overweight/obesity As related to hx of  large portion sizes, fried foods, and high sugar foods.  As evidenced by BMI of 45.3 and Hgb A1c of 6.2%.    Intervention:  Nutrition counseling provided. Discussed the importance of resuming vitamin D prescription and encouraged her to talk to her doctor about it. Also recommended that she try to get more sleep at night, try to relax before bed and stop all electronic about an hour before sleep.   Plan: Talk to doctor about Vitamin D prescription. At the very least take 2000 units of vitamin D every day.  For breakfast, measure what you eat for Cheerios and milk. (Aim for 30-45 g of carbs). At 9:45, have 1/2 cup or 1 packet of plain oatmeal with cinnamon and a protein (babybel or string cheese).  Fill half of your plate at lunch and dinner with vegetables.  Have a quarter of your plate carbohydrates (30-45 g of carbs). Pair protein with carbohydrate for snacks (up to 15 g of carbohydrate). [place] Protein bar for a snack.  Teaching Method Utilized:  Visual Auditory Hands on  Handouts given during visit include:  Living Well With Diabetes  Yellow Card  MyPlate Handout  15 g CHO Snacks  Barriers to learning/adherence to lifestyle change: none  Demonstrated degree of understanding via:  Teach Back   Monitoring/Evaluation:  Dietary intake, exercise, and body weight in 2 month(s).

## 2013-12-29 NOTE — Patient Instructions (Addendum)
Talk to doctor about Vitamin D prescription. At the very least take 2000 units of vitamin D every day.  For breakfast, measure what you eat for Cheerios and milk. (Aim for 30-45 g of carbs). At 9:45, have 1/2 cup or 1 packet of plain oatmeal with cinnamon and a protein (babybel or string cheese).  Fill half of your plate at lunch and dinner with vegetables.  Have a quarter of your plate carbohydrates (30-45 g of carbs). Pair protein with carbohydrate for snacks (up to 15 g of carbohydrate). [place] Protein bar for a snack.

## 2014-01-31 IMAGING — CT CT HEAD W/O CM
3 of 5 series · 14 of 30 positions shown, 16 images · non-contrast
Comparison: 10/31/2011 head and cervical spine CT

CLINICAL DATA: Facial trauma

EXAM:
CT HEAD WITHOUT CONTRAST
CT MAXILLOFACIAL WITHOUT CONTRAST
CT CERVICAL SPINE WITHOUT CONTRAST
TECHNIQUE: Multidetector CT imaging of the head, cervical spine, and
maxillofacial structures were performed using the standard protocol
without intravenous contrast. Multiplanar CT image reconstructions
of the cervical spine and maxillofacial structures were also
generated.

[Series 3: facial st · axial · 0.33mm/px · z∈[+1434,+1520]mm · 4 of 73 slices shown]
[im 15/73  brain]
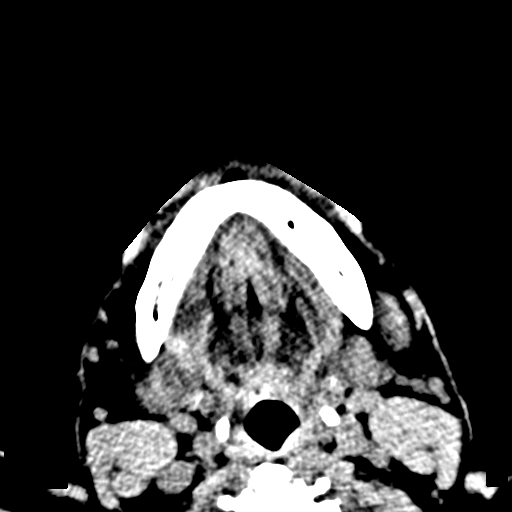
[im 29/73  brain]
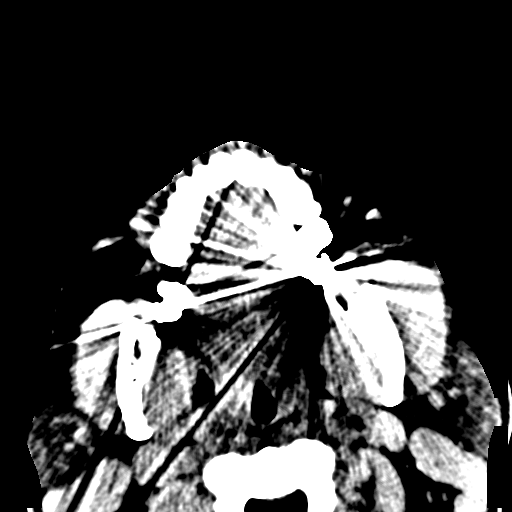
[im 44/73  brain]
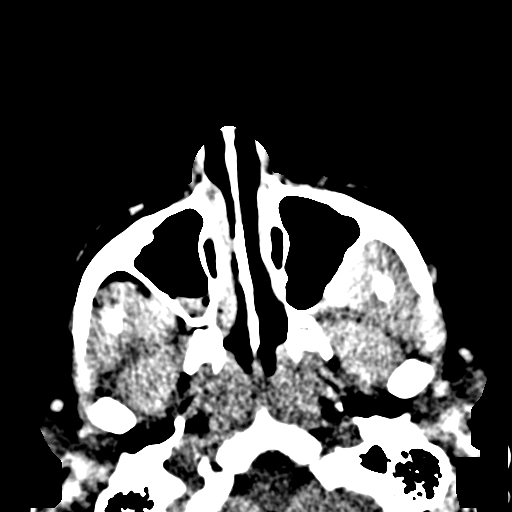
[im 58/73  brain]
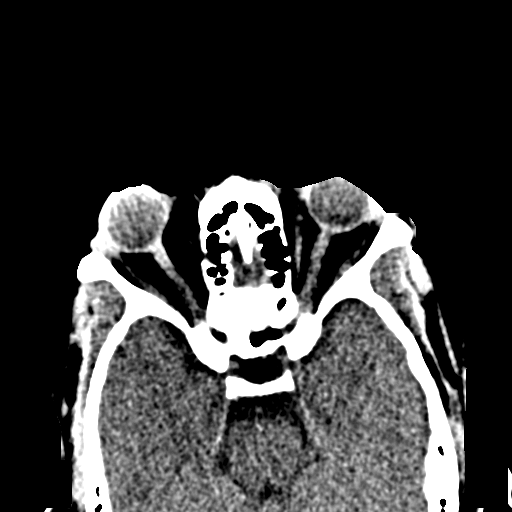

[Series 11: c-spine st · axial · 0.28mm/px · z∈[+1360,+1404]mm · 3 of 87 slices shown]
[im 11/87  brain]
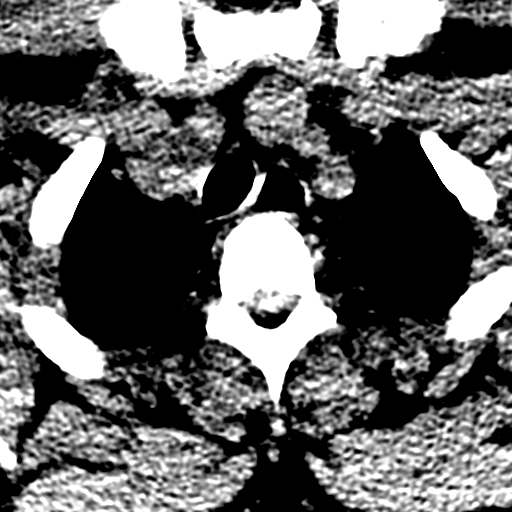
[im 22/87  brain]
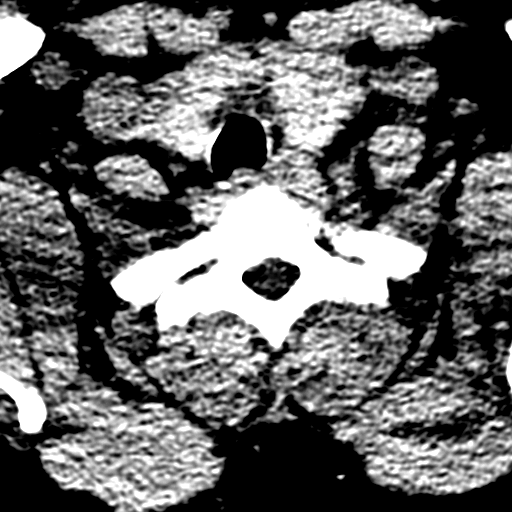
[im 33/87  brain]
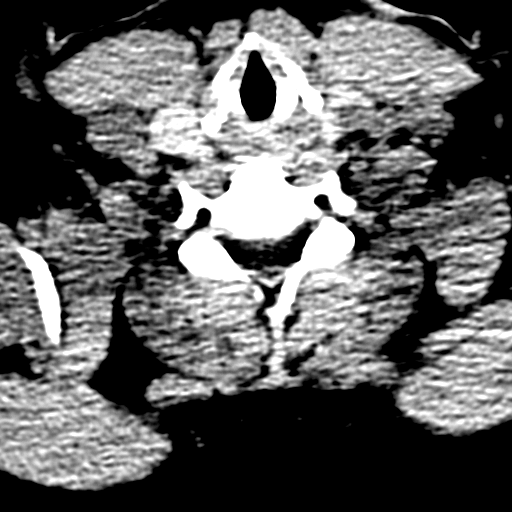

[Series 15: axial · axial · 0.23mm/px · z∈[+1348,+1472]mm · 7 of 86 slices shown, 9 images]
[im 11/86  brain]
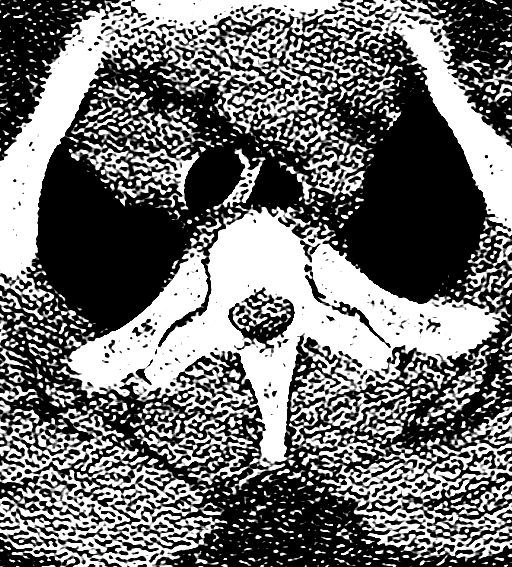
[im 11/86  bone]
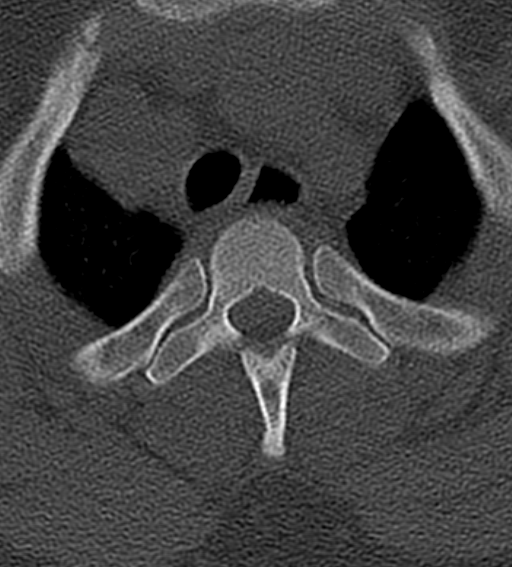
[im 22/86  brain]
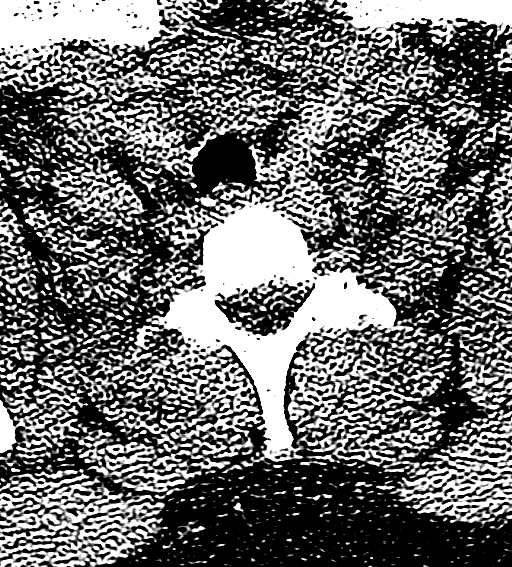
[im 32/86  brain]
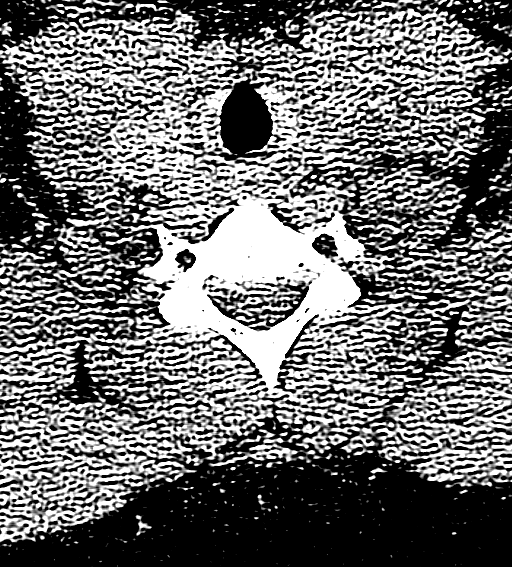
[im 43/86  brain]
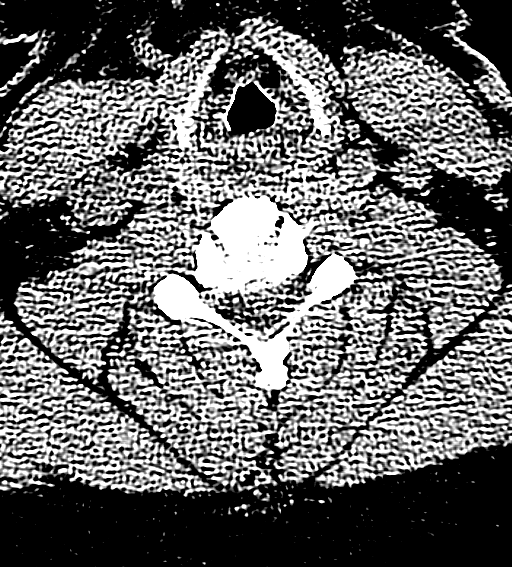
[im 54/86  brain]
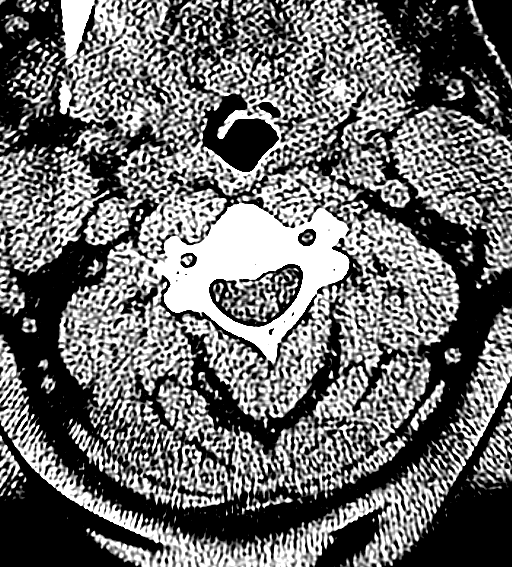
[im 54/86  bone]
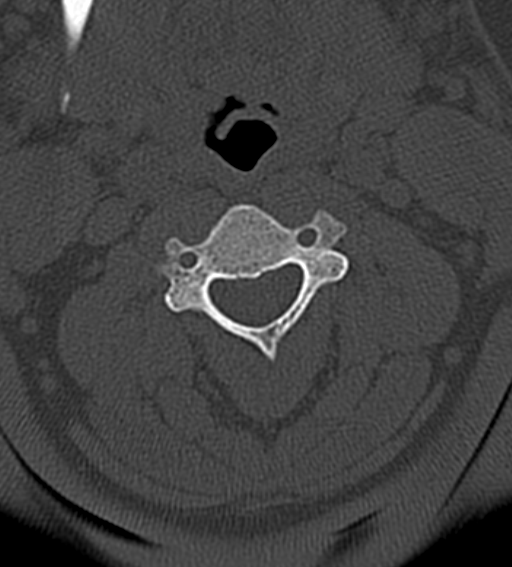
[im 64/86  brain]
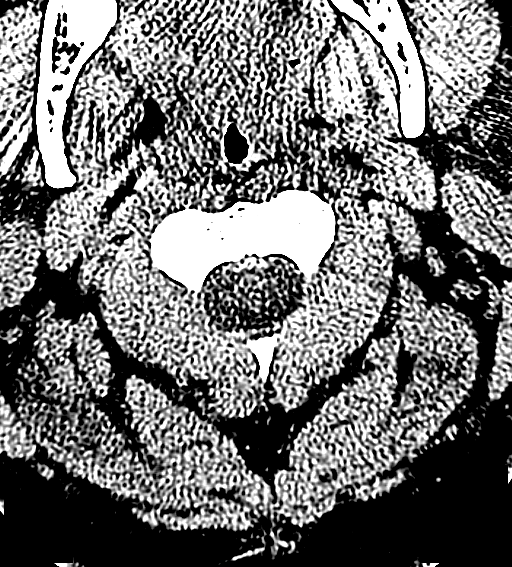
[im 75/86  brain]
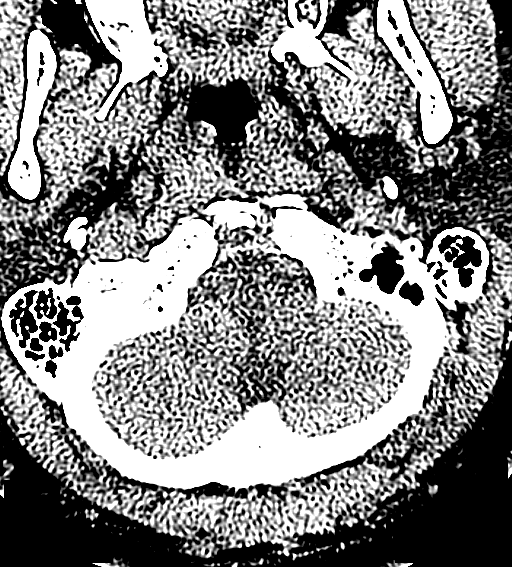

[14 of 30 positions shown; findings below may reference images not displayed]

FINDINGS: CT HEAD FINDINGS

Mild prominence of the sulci, cisterns, and ventricles, in keeping
with mild volume loss and similar to prior. No hydrocephalus. No CT
evidence of acute infarction. No intraparenchymal hemorrhage, mass,
mass effect, or abnormal extra-axial fluid collection. No displaced
calvarial fracture.

CT MAXILLOFACIAL FINDINGS

Large amount of preseptal and to a lesser extent postseptal gas.
There is a locule of air within the intraconal fat abutting the
medial rectus muscle. The globes are symmetric in size. There is
slight exophthalmos on the right. Lenses are located. No retrobulbar
hematoma. Right medial orbital wall fracture. Herniation of
extraconal fat into the defect. The medial rectus muscle herniates
toward the defect. A nondisplaced fracture of the posterolateral
right orbital wall is also suspected. The orbital floor appears
intact. Air tracks inferiorly along the mastication musculature and
subcutaneous tissues

Air-fluid level/debris right maxillary sinus. Nondisplaced fracture
of the right maxilla, extends a long the floor and lateral wall of
the right maxillary sinus (series 7, image 30/74 and series 4, image
39/73). Otherwise, no displaced fracture of the maxillary sinus
identified. The nasal bones and nasal septum are intact. Sphenoid
chamber is clear. Underpneumatized right frontal sinus. Left frontal
sinus clear. Mastoid air cells clear.

Intact pterygoid plates and zygomatic arches. Temporomandibular
joints are located. No mandible fracture.

CT CERVICAL SPINE FINDINGS

Lung apices are clear. Maintained vertebral body height and
alignment. Maintained craniocervical relationship. No dens fracture.
Straightening of the normal cervical lordosis. Multilevel
degenerative changes, most pronounced at C3-4 and C4-5.
Paravertebral soft tissues within normal limits. 1.2 cm nodule left
lobe of the thyroid gland.
IMPRESSION: No acute intracranial abnormality.

Right orbital trauma with preseptal and postseptal gas and mild
exophthalmos. No retrobulbar hematoma.

Fractured right medial orbital wall with herniation of extraconal
fat into the defect. The medial rectus muscle herniates toward the
defect. Correlate clinically for entrapment.

Nondisplaced right lateral orbital wall fracture is questioned.

Debris within the right maxillary sinus. Nondisplaced fracture of
the right maxilla is questioned, with extension into the
posterolateral wall of the right maxillary sinus.

Multilevel degenerative changes and straightening of the normal
cervical lordosis, which is favored to be secondary to degenerative
change, similar to prior.

Left thyroid lobe nodule. Correlate with nonemergent ultrasound
follow-up.

Emergent findings discussed via telephone with Dr. Bamberg at [DATE]
p.m. on 03/30/2013.

## 2014-03-04 ENCOUNTER — Other Ambulatory Visit: Payer: Self-pay

## 2014-03-04 DIAGNOSIS — Z1231 Encounter for screening mammogram for malignant neoplasm of breast: Secondary | ICD-10-CM

## 2014-03-09 ENCOUNTER — Ambulatory Visit: Payer: BC Managed Care – PPO | Admitting: Dietician

## 2014-05-02 ENCOUNTER — Emergency Department (HOSPITAL_COMMUNITY)
Admission: EM | Admit: 2014-05-02 | Discharge: 2014-05-03 | Disposition: A | Discharge status: Home / Self Care | Payer: BLUE CROSS/BLUE SHIELD | Attending: Emergency Medicine | Admitting: Emergency Medicine

## 2014-05-02 DIAGNOSIS — Z8639 Personal history of other endocrine, nutritional and metabolic disease: Secondary | ICD-10-CM | POA: Diagnosis not present

## 2014-05-02 DIAGNOSIS — X58XXXA Exposure to other specified factors, initial encounter: Secondary | ICD-10-CM | POA: Insufficient documentation

## 2014-05-02 DIAGNOSIS — Y9289 Other specified places as the place of occurrence of the external cause: Secondary | ICD-10-CM | POA: Diagnosis not present

## 2014-05-02 DIAGNOSIS — Y998 Other external cause status: Secondary | ICD-10-CM | POA: Insufficient documentation

## 2014-05-02 DIAGNOSIS — S8391XA Sprain of unspecified site of right knee, initial encounter: Secondary | ICD-10-CM | POA: Diagnosis not present

## 2014-05-02 DIAGNOSIS — S8991XA Unspecified injury of right lower leg, initial encounter: Secondary | ICD-10-CM | POA: Diagnosis present

## 2014-05-02 DIAGNOSIS — Y939 Activity, unspecified: Secondary | ICD-10-CM | POA: Diagnosis not present

## 2014-05-03 ENCOUNTER — Encounter (HOSPITAL_COMMUNITY): Payer: Self-pay | Admitting: Emergency Medicine

## 2014-05-03 MED ORDER — IBUPROFEN 800 MG PO TABS: 800.0000 mg | ORAL_TABLET | Freq: Three times a day (TID) | ORAL | Status: DC | PRN

## 2014-05-03 MED ORDER — IBUPROFEN 400 MG PO TABS
800.0000 mg | ORAL_TABLET | Freq: Once | ORAL | Status: AC
  Administered 2014-05-03: 800 mg via ORAL
  Filled 2014-05-03: qty 2

## 2014-05-03 NOTE — ED Notes (Signed)
Gertie Fey, PA-C, at the bedside.

## 2014-05-03 NOTE — ED Notes (Signed)
Patient states she stepped on her right leg wrong, and noticed it's been hurting since last night. No previous surgeries on leg, and denies falls. Pain in right knee, rating 9/10. No obvious deformity.

## 2014-05-03 NOTE — Discharge Instructions (Signed)

## 2014-05-03 NOTE — ED Provider Notes (Signed)
CSN: [id number]     Arrival date & time 05/02/14  2348 History   First MD Initiated Contact with Patient 05/03/14 0024     Chief Complaint  Patient presents with  . Knee Pain     (Consider location/radiation/quality/duration/timing/severity/associated sxs/prior Treatment) HPI   54 year old female presents for evaluation of right knee injury. Patient states that tonight when going to church she was wearign heel and she actually stepped awkwardly and injured her right leg. Report hearing a "pop" and experienced mild tenderness to anterior R knee from the incident but she did not think much of it.  Later on when driving home she notice increase sharp throbbing pain to right knee, 9 out of 10, nonradiating.  Increasing pain with knee flexion.  No R hip or R ankle pain, no numbness or weakness.  She did tried OTC med with minimal relief.  No prior injury to same knee.    Past Medical History  Diagnosis Date  . Hyperlipidemia    Past Surgical History  Procedure Laterality Date  . Back surgery    . Vaginal hysterectomy     History reviewed. No pertinent family history. History  Substance Use Topics  . Smoking status: Never Smoker   . Smokeless tobacco: Not on file  . Alcohol Use: No   OB History    No data available     Review of Systems  Constitutional: Negative for fever.  Musculoskeletal: Positive for arthralgias and gait problem.  Skin: Negative for rash and wound.  Neurological: Negative for numbness.      Allergies  Review of patient's allergies indicates no known allergies.  Home Medications   Prior to Admission medications   Medication Sig Start Date End Date Taking? Authorizing Provider  benzonatate (TESSALON) 200 MG capsule Take 1 capsule (200 mg total) by mouth 3 (three) times daily as needed for cough. 02/08/13   Kalman Drape, MD  oxyCODONE-acetaminophen (PERCOCET/ROXICET) 5-325 MG per tablet Take 1-2 tablets by mouth every 4 (four) hours as needed for severe  pain. 03/31/13   Kristen N Ward, DO   BP 159/99 mmHg  Pulse 101  Temp(Src) 98.2 F (36.8 C) (Oral)  Resp 20  Ht 4\' 11"  (1.499 m)  Wt 228 lb (103.42 kg)  BMI 46.03 kg/m2  SpO2 99% Physical Exam  Constitutional: She appears well-developed and well-nourished. No distress.  HENT:  Head: Atraumatic.  Eyes: Conjunctivae are normal.  Neck: Neck supple.  Musculoskeletal: She exhibits tenderness (R knee: mild tenderness to anterior knee inferior to patella.  decrease knee flexion 2/2 pain.  no gross deformity, no joint laxity, no overlying skin changes or edema). She exhibits no edema.  Normal R hip and R ankle.  Intact distal pulses.  Neurological: She is alert.  Skin: No rash noted.  Psychiatric: She has a normal mood and affect.  Nursing note and vitals reviewed.   ED Course  Procedures (including critical care time)  12:35 AM Mechanical injury to R knee.  Able to ambulate, no gross deformity low suspicion for acute fx/dislocation.  Suspect R knee sprain.  NVI.  Knee sleeve provide for support, RICE therapy discussed.  Ortho referral given as needed.   Labs Review Labs Reviewed - No data to display  Imaging Review No results found.   EKG Interpretation None      MDM   Final diagnoses:  Right knee sprain, initial encounter    BP 146/93 mmHg  Pulse 93  Temp(Src) 98.4 F (36.9 C) (Oral)  Resp 20  Ht 4\' 11"  (1.499 m)  Wt 228 lb (103.42 kg)  BMI 46.03 kg/m2  SpO2 94%     Domenic Moras, PA-C 94/17/40 [id number]  Delora Fuel, MD 81/85/63 1497

## 2014-07-07 ENCOUNTER — Other Ambulatory Visit: Payer: Self-pay | Admitting: Orthopaedic Surgery

## 2014-07-07 DIAGNOSIS — M25561 Pain in right knee: Secondary | ICD-10-CM

## 2014-07-12 ENCOUNTER — Ambulatory Visit
Admission: RE | Admit: 2014-07-12 | Discharge: 2014-07-12 | Disposition: A | Discharge status: Home / Self Care | Payer: BLUE CROSS/BLUE SHIELD | Source: Ambulatory Visit | Attending: Orthopaedic Surgery | Admitting: Orthopaedic Surgery

## 2014-07-12 DIAGNOSIS — M25561 Pain in right knee: Secondary | ICD-10-CM

## 2015-01-26 ENCOUNTER — Ambulatory Visit: Payer: BLUE CROSS/BLUE SHIELD

## 2015-02-01 ENCOUNTER — Ambulatory Visit
Admission: RE | Admit: 2015-02-01 | Discharge: 2015-02-01 | Disposition: A | Discharge status: Home / Self Care | Payer: BLUE CROSS/BLUE SHIELD | Source: Ambulatory Visit

## 2015-02-01 DIAGNOSIS — Z1231 Encounter for screening mammogram for malignant neoplasm of breast: Secondary | ICD-10-CM

## 2017-01-19 ENCOUNTER — Emergency Department (HOSPITAL_COMMUNITY): Payer: BLUE CROSS/BLUE SHIELD

## 2017-01-19 ENCOUNTER — Other Ambulatory Visit: Payer: Self-pay

## 2017-01-19 ENCOUNTER — Encounter (HOSPITAL_COMMUNITY): Payer: Self-pay | Admitting: Emergency Medicine

## 2017-01-19 ENCOUNTER — Emergency Department (HOSPITAL_COMMUNITY)
Admission: EM | Admit: 2017-01-19 | Discharge: 2017-01-20 | Disposition: A | Discharge status: Home / Self Care | Payer: BLUE CROSS/BLUE SHIELD | Attending: Physician Assistant | Admitting: Physician Assistant

## 2017-01-19 DIAGNOSIS — W010XXA Fall on same level from slipping, tripping and stumbling without subsequent striking against object, initial encounter: Secondary | ICD-10-CM | POA: Diagnosis not present

## 2017-01-19 DIAGNOSIS — M25511 Pain in right shoulder: Secondary | ICD-10-CM

## 2017-01-19 DIAGNOSIS — M7989 Other specified soft tissue disorders: Secondary | ICD-10-CM | POA: Diagnosis not present

## 2017-01-19 DIAGNOSIS — M25521 Pain in right elbow: Secondary | ICD-10-CM | POA: Diagnosis not present

## 2017-01-19 DIAGNOSIS — S4991XA Unspecified injury of right shoulder and upper arm, initial encounter: Secondary | ICD-10-CM | POA: Diagnosis not present

## 2017-01-19 MED ORDER — HYDROCODONE-ACETAMINOPHEN 5-325 MG PO TABS
2.0000 | ORAL_TABLET | Freq: Once | ORAL | Status: AC
  Administered 2017-01-19: 2 via ORAL
  Filled 2017-01-19: qty 2

## 2017-01-19 NOTE — ED Notes (Signed)
ED Provider at bedside. 

## 2017-01-19 NOTE — ED Triage Notes (Signed)
Pt had a mechanical fall this morning and landed on her right arm and shoulder.  She denies hitting her head or any LOC.  At this time she also reports a headache which she has had for a few days.

## 2017-01-19 NOTE — ED Provider Notes (Signed)
[facility] EMERGENCY DEPARTMENT Provider Note   CSN: [id number] Arrival date & time: 01/19/17  2140     History   Chief Complaint Chief Complaint  Patient presents with  . Fall  . Arm Pain    HPI Linda Olson is a 56 y.o. female.  Patient presents to the ED with a chief complaint of right arm pain.  She states that she slipped and fell this morning on ice this morning.  She states that she fell backward landing on her right shoulder.  She states that she has pain when lifting her right shoulder and arm.  She has tried taking NSAIDs today with no relief.  She reports some tingling in her hand.  She also reports a headache, but denies hitting her head or passing out.  She denies any other associated symptoms today.    The history is provided by the patient. No language interpreter was used.    Past Medical History:  Diagnosis Date  . Hyperlipidemia     There are no active problems to display for this patient.   Past Surgical History:  Procedure Laterality Date  . BACK SURGERY    . VAGINAL HYSTERECTOMY      OB History    No data available       Home Medications    Prior to Admission medications   Medication Sig Start Date End Date Taking? Authorizing Provider  acetaminophen (TYLENOL) 500 MG tablet Take 500 mg every 6 (six) hours as needed by mouth (pain).   Yes [provider]  naproxen sodium (ALEVE) 220 MG tablet Take 220 mg 2 (two) times daily as needed by mouth (pain).   Yes [provider]  benzonatate (TESSALON) 200 MG capsule Take 1 capsule (200 mg total) by mouth 3 (three) times daily as needed for cough. Patient not taking: Reported on 01/19/2017 02/08/13   Linton Flemings, MD  ibuprofen (ADVIL,MOTRIN) 800 MG tablet Take 1 tablet (800 mg total) by mouth every 8 (eight) hours as needed for moderate pain. Patient not taking: Reported on 01/19/2017 05/03/14   Domenic Moras, PA-C  oxyCODONE-acetaminophen (PERCOCET/ROXICET)  5-325 MG per tablet Take 1-2 tablets by mouth every 4 (four) hours as needed for severe pain. Patient not taking: Reported on 01/19/2017 03/31/13   Ward, Delice Bison, DO    Family History No family history on file.  Social History Social History   Tobacco Use  . Smoking status: Never Smoker  Substance Use Topics  . Alcohol use: No  . Drug use: No     Allergies   Other   Review of Systems Review of Systems  All other systems reviewed and are negative.    Physical Exam Updated Vital Signs BP (!) 192/98 (BP Location: Left Arm)   Pulse 82   Temp 98.1 F (36.7 C) (Oral)   Resp 16   Ht 5' (1.524 m)   Wt 97.5 kg (215 lb)   SpO2 100%   BMI 41.99 kg/m   Physical Exam Nursing note and vitals reviewed.  Constitutional: Pt appears well-developed and well-nourished. No distress.  HENT:  Head: Normocephalic and atraumatic.  Eyes: Conjunctivae are normal.  Neck: Normal range of motion.  Cardiovascular: Normal rate, regular rhythm. Intact distal pulses.   Capillary refill < 3 sec.  Pulmonary/Chest: Effort normal and breath sounds normal.  Musculoskeletal:  Right shoulder Pt exhibits TTP with no bony abnormality or deformity.   ROM: 4/5 limited by pain  Strength: 4/5 limited  by pain  Neurological: Pt  is alert. Coordination normal.  Sensation: 5/5 Skin: Skin is warm and dry. Pt is not diaphoretic.  No evidence of open wound or skin tenting Psychiatric: Pt has a normal mood and affect.     ED Treatments / Results  Labs (all labs ordered are listed, but only abnormal results are displayed) Labs Reviewed - No data to display  EKG  EKG Interpretation None       Radiology No results found.  Procedures Procedures (including critical care time)  Medications Ordered in ED Medications  HYDROcodone-acetaminophen (NORCO/VICODIN) 5-325 MG per tablet 2 tablet (not administered)     Initial Impression / Assessment and Plan / ED Course  I have reviewed the  triage vital signs and the nursing notes.  Pertinent labs & imaging results that were available during my care of the patient were reviewed by me and considered in my medical decision making (see chart for details).     Patient presents with injury to right shoulder.  DDx includes, fracture, strain, or sprain.  Consultants: none  Plain films reveal possible fracture, recommend CT for further evaluation.  CT negative for acute fracture.  Pt advised to follow up with PCP and/or orthopedics. Patient given sling while in ED, conservative therapy such as RICE recommended and discussed.   Patient will be discharged home & is agreeable with above plan. Returns precautions discussed. Pt appears safe for discharge.   Final Clinical Impressions(s) / ED Diagnoses   Final diagnoses:  Acute pain of right shoulder    ED Discharge Orders    None       Montine Circle, PA-C 01/20/17 0113    Macarthur Critchley, MD 01/20/17 2150

## 2017-01-19 NOTE — ED Notes (Signed)
Patient educated about not driving or performing other critical tasks (such as operating heavy machinery, caring for infant/toddler/child) due to sedative nature of narcotic medications received while in the ED.  Pt/caregiver verbalized understanding.   

## 2017-01-19 NOTE — ED Notes (Signed)
Patient transported to X-ray 

## 2017-01-19 NOTE — ED Notes (Signed)
Patient transported to CT 

## 2017-01-20 ENCOUNTER — Emergency Department (HOSPITAL_COMMUNITY): Payer: BLUE CROSS/BLUE SHIELD

## 2017-01-20 DIAGNOSIS — M25511 Pain in right shoulder: Secondary | ICD-10-CM | POA: Diagnosis not present

## 2017-01-20 DIAGNOSIS — S4991XA Unspecified injury of right shoulder and upper arm, initial encounter: Secondary | ICD-10-CM | POA: Diagnosis not present

## 2017-01-20 MED ORDER — HYDROCODONE-ACETAMINOPHEN 5-325 MG PO TABS: 1.0000 | ORAL_TABLET | Freq: Four times a day (QID) | ORAL | 0 refills | Status: DC | PRN

## 2017-01-28 ENCOUNTER — Encounter (INDEPENDENT_AMBULATORY_CARE_PROVIDER_SITE_OTHER): Payer: Self-pay | Admitting: Orthopaedic Surgery

## 2017-01-28 ENCOUNTER — Ambulatory Visit (INDEPENDENT_AMBULATORY_CARE_PROVIDER_SITE_OTHER): Payer: BLUE CROSS/BLUE SHIELD | Admitting: Orthopaedic Surgery

## 2017-01-28 DIAGNOSIS — M25511 Pain in right shoulder: Secondary | ICD-10-CM | POA: Diagnosis not present

## 2017-01-28 MED ORDER — NAPROXEN 500 MG PO TABS: 500.0000 mg | ORAL_TABLET | Freq: Two times a day (BID) | ORAL | 3 refills | Status: DC

## 2017-01-28 NOTE — Progress Notes (Signed)
Office Visit Note   Patient: Linda Olson           Date of Birth: 1960/08/16           MRN: [id number] Visit Date: 01/28/2017              Requested by: [name], [place], [place] [address] PCP: [name], MD   Assessment & Plan: Visit Diagnoses:  1. Acute pain of right shoulder     Plan: Impression is right shoulder contusion.  Recommend physical therapy, rest, prescription for naproxen.  Questions encouraged and answered.  Follow-up as needed.  Follow-Up Instructions: Return if symptoms worsen or fail to improve.   Orders:  No orders of the defined types were placed in this encounter.  Meds ordered this encounter  Medications  . naproxen (NAPROSYN) 500 MG tablet    Sig: Take 1 tablet (500 mg total) by mouth 2 (two) times daily with a meal.    Dispense:  30 tablet    Refill:  3      Procedures: No procedures performed   Clinical Data: No additional findings.   Subjective: Chief Complaint  Patient presents with  . Right Shoulder - Pain    Patient is a 56 year old female who had an acute fall onto her right shoulder 2 weeks ago and was evaluated in the ER.  X-rays and CT scans were negative.  She has 7 out of 10 pain and she takes Norco as needed.  She endorses some tingling but no true radicular symptoms.  Denies any numbness.  Pain is worse with elevation of her arm.    Review of Systems  Constitutional: Negative.   HENT: Negative.   Eyes: Negative.   Respiratory: Negative.   Cardiovascular: Negative.   Endocrine: Negative.   Musculoskeletal: Negative.   Neurological: Negative.   Hematological: Negative.   Psychiatric/Behavioral: Negative.   All other systems reviewed and are negative.    Objective: Vital Signs: There were no vitals taken for this visit.  Physical Exam  Constitutional: She is oriented to person, place, and time. She appears well-developed and well-nourished.  HENT:  Head: Normocephalic and  atraumatic.  Eyes: EOM are normal.  Neck: Neck supple.  Pulmonary/Chest: Effort normal.  Abdominal: Soft.  Neurological: She is alert and oriented to person, place, and time.  Skin: Skin is warm. Capillary refill takes less than 2 seconds.  Psychiatric: She has a normal mood and affect. Her behavior is normal. Judgment and thought content normal.  Nursing note and vitals reviewed.   Ortho Exam Right shoulder exam shows painless active range of motion up to the level of the shoulder and then with discomfort above the level of the shoulder.  Rotator cuff is grossly intact and functioning.  Mild impingement.  No focal findings.  Impression is right shoulder contusion.  Recommend physical therapy for 4 weeks Specialty Comments:  No specialty comments available.  Imaging: No results found.   PMFS History: There are no active problems to display for this patient.  Past Medical History:  Diagnosis Date  . Hyperlipidemia     History reviewed. No pertinent family history.  Past Surgical History:  Procedure Laterality Date  . BACK SURGERY    . VAGINAL HYSTERECTOMY     Social History   Occupational History  . Not on file  Tobacco Use  . Smoking status: Never Smoker  . Smokeless tobacco: Never Used  Substance and Sexual Activity  . Alcohol  use: No  . Drug use: No  . Sexual activity: Not on file

## 2017-04-23 ENCOUNTER — Other Ambulatory Visit (INDEPENDENT_AMBULATORY_CARE_PROVIDER_SITE_OTHER): Payer: Self-pay | Admitting: Orthopaedic Surgery

## 2017-09-26 DIAGNOSIS — Z1159 Encounter for screening for other viral diseases: Secondary | ICD-10-CM | POA: Diagnosis not present

## 2017-09-26 DIAGNOSIS — E559 Vitamin D deficiency, unspecified: Secondary | ICD-10-CM | POA: Diagnosis not present

## 2017-09-26 DIAGNOSIS — E7800 Pure hypercholesterolemia, unspecified: Secondary | ICD-10-CM | POA: Diagnosis not present

## 2017-09-26 DIAGNOSIS — R7303 Prediabetes: Secondary | ICD-10-CM | POA: Diagnosis not present

## 2017-09-26 DIAGNOSIS — Z0000 Encounter for general adult medical examination without abnormal findings: Secondary | ICD-10-CM | POA: Diagnosis not present

## 2017-10-12 ENCOUNTER — Other Ambulatory Visit: Payer: Self-pay | Admitting: Family Medicine

## 2017-10-12 DIAGNOSIS — R519 Headache, unspecified: Secondary | ICD-10-CM

## 2017-10-12 DIAGNOSIS — R51 Headache: Principal | ICD-10-CM

## 2017-10-21 DIAGNOSIS — R51 Headache: Secondary | ICD-10-CM | POA: Diagnosis not present

## 2017-10-21 DIAGNOSIS — R7303 Prediabetes: Secondary | ICD-10-CM | POA: Diagnosis not present

## 2017-10-21 DIAGNOSIS — I10 Essential (primary) hypertension: Secondary | ICD-10-CM | POA: Diagnosis not present

## 2017-10-21 DIAGNOSIS — Z23 Encounter for immunization: Secondary | ICD-10-CM | POA: Diagnosis not present

## 2017-10-25 ENCOUNTER — Other Ambulatory Visit: Payer: Self-pay | Admitting: Family Medicine

## 2017-10-25 DIAGNOSIS — Z1231 Encounter for screening mammogram for malignant neoplasm of breast: Secondary | ICD-10-CM

## 2017-11-12 ENCOUNTER — Encounter: Payer: BLUE CROSS/BLUE SHIELD | Attending: Family Medicine | Admitting: Registered"

## 2017-11-12 DIAGNOSIS — R7303 Prediabetes: Secondary | ICD-10-CM | POA: Diagnosis not present

## 2017-11-12 DIAGNOSIS — Z713 Dietary counseling and surveillance: Secondary | ICD-10-CM | POA: Diagnosis not present

## 2017-11-12 NOTE — Patient Instructions (Signed)
Consider trying some of the tips to get more sleep Aim to eat balanced meals and snacks, including protein when eating carbohydrates. Consider asking your doctor to go ahead and fill the Metformin prescription

## 2017-11-12 NOTE — Progress Notes (Addendum)
Medical Nutrition Therapy:  Appt start time: 7062 end time:  1715.  Assessment:  Primary concerns today: Pt states she has a sister who let her diabetes go and now has to take shots and she doesn't want that to happen to her. Pt states her doctor wanted her to start metformin, but was waiting until she saw a nutritionist to help her understand the purpose and how it relates to lifestyle changes.  Per referral A1c is 6.4%. Pt came to [facility] for pre-diabetes counseling in 2015 , Vit D deficiency and sleep sleep were also addressed at that time. Pt most recent vit D lab was 7.9, and reports that she is now taking vit D 50,000 units MWF.  Patient states she has tried many different diets and strategies to lose weight and has lost then regained. RD suggested that we need to use other measures of success in achieving better health outcomes and we will continue to explore what that might look like in her life.  Sleep: ~4.5 - 5 hrs. (appears sleepy during visit) Pt states she goes to bed 11:30 usually asleep by 12 and then up at 4:30 and gets her day started, may go to the gym or to the grocery store before she has to be to work at 7 am. Pt states she works M-F 45-50 hrs per week. Pt states she gets home late and stays busy sometimes cooks and doesn't want to go to bed too soon after dinner. Patient states she is open to trying to change her sleep habits.  Pt states 2 months ago she stopped eating fried foods and recently ate some fried chicken and livers and she felt terrible after. Pt states she would like to learn how much to eat and what snack foods are good. RD will address these questions more at the next visit and go over mindful eating more.  Preferred Learning Style:   No preference indicated   Learning Readiness:   Change in progress  MEDICATIONS: reviewed   DIETARY INTAKE:  24-hr recall:  B ( AM): ?  Snk ( AM): chips  L ( PM): taco soup with beans Snk ( PM): caramel rice cakes D ( PM):  herbalife shake Snk ( PM): ? Beverages: water, unsweet tea, coffee black  Usual physical activity: ADLs, [facility].  Estimated energy needs: 1600 calories  Progress Towards Goal(s):  New goals.   Nutritional Diagnosis:  NB-1.1 Food and nutrition-related knowledge deficit As related to using body cues for portion control.  As evidenced by patient confusion about what, how much, and when to eat..    Intervention:  Nutrition Education. Discussed listening to body cues. Discussed how adequate sleep is important to be able to get clear messages what foods the body wants/needs. Discussed the role sleep in overall health and blood sugar control. Taught the connection of insulin and glucose for energy and blood sugar control and the role of metformin.  Plan: Consider trying some of the tips to get more sleep Aim to eat balanced meals and snacks, including protein when eating carbohydrates. Consider asking your doctor to go ahead and fill the Metformin prescription   Teaching Method Utilized:  Visual Auditory  Handouts given during visit include:  Sleep hygiene  MyPlate  Snack sheet  Barriers to learning/adherence to lifestyle change: none  Demonstrated degree of understanding via:  Teach Back   Monitoring/Evaluation:  Dietary intake, exercise, sleep, and body weight in 4 week(s).

## 2017-11-21 ENCOUNTER — Ambulatory Visit
Admission: RE | Admit: 2017-11-21 | Discharge: 2017-11-21 | Disposition: A | Discharge status: Home / Self Care | Payer: BLUE CROSS/BLUE SHIELD | Source: Ambulatory Visit | Attending: Family Medicine | Admitting: Family Medicine

## 2017-11-21 DIAGNOSIS — Z1231 Encounter for screening mammogram for malignant neoplasm of breast: Secondary | ICD-10-CM

## 2017-12-17 ENCOUNTER — Ambulatory Visit: Payer: BLUE CROSS/BLUE SHIELD | Admitting: Registered"

## 2017-12-30 DIAGNOSIS — E559 Vitamin D deficiency, unspecified: Secondary | ICD-10-CM | POA: Diagnosis not present

## 2017-12-30 DIAGNOSIS — Z23 Encounter for immunization: Secondary | ICD-10-CM | POA: Diagnosis not present

## 2017-12-30 DIAGNOSIS — I10 Essential (primary) hypertension: Secondary | ICD-10-CM | POA: Diagnosis not present

## 2017-12-30 DIAGNOSIS — R7303 Prediabetes: Secondary | ICD-10-CM | POA: Diagnosis not present

## 2018-02-17 DIAGNOSIS — E119 Type 2 diabetes mellitus without complications: Secondary | ICD-10-CM | POA: Diagnosis not present

## 2018-05-05 DIAGNOSIS — K529 Noninfective gastroenteritis and colitis, unspecified: Secondary | ICD-10-CM | POA: Diagnosis not present

## 2018-08-18 ENCOUNTER — Other Ambulatory Visit: Payer: Self-pay | Admitting: Podiatry

## 2018-08-18 ENCOUNTER — Ambulatory Visit (INDEPENDENT_AMBULATORY_CARE_PROVIDER_SITE_OTHER): Payer: BC Managed Care – PPO

## 2018-08-18 ENCOUNTER — Encounter: Payer: Self-pay | Admitting: Podiatry

## 2018-08-18 ENCOUNTER — Ambulatory Visit (INDEPENDENT_AMBULATORY_CARE_PROVIDER_SITE_OTHER): Payer: BC Managed Care – PPO | Admitting: Podiatry

## 2018-08-18 ENCOUNTER — Other Ambulatory Visit: Payer: Self-pay

## 2018-08-18 VITALS — BP 153/96 | HR 72 | Temp 98.0°F | Resp 16

## 2018-08-18 DIAGNOSIS — M79671 Pain in right foot: Secondary | ICD-10-CM | POA: Diagnosis not present

## 2018-08-18 DIAGNOSIS — M79672 Pain in left foot: Secondary | ICD-10-CM | POA: Diagnosis not present

## 2018-08-18 DIAGNOSIS — M722 Plantar fascial fibromatosis: Secondary | ICD-10-CM

## 2018-08-18 MED ORDER — DICLOFENAC SODIUM 75 MG PO TBEC: 75.0000 mg | DELAYED_RELEASE_TABLET | Freq: Two times a day (BID) | ORAL | 2 refills | Status: DC

## 2018-08-18 NOTE — Patient Instructions (Signed)

## 2018-08-18 NOTE — Progress Notes (Signed)
   Subjective:    Patient ID: Linda Olson, female    DOB: 06-Feb-1961, 58 y.o.   MRN: [id number]  HPI    Review of Systems  All other systems reviewed and are negative.      Objective:   Physical Exam        Assessment & Plan:

## 2018-08-20 NOTE — Progress Notes (Signed)
Subjective:   Patient ID: Linda Olson, female   DOB: 58 y.o.   MRN: [id number]   HPI Patient states she has had a lot of pain in the bottom of her left heel and it is been going on for a while and making it hard to walk and also is concerned about nail disease stating that they are thick and she wants to have them checked.  Patient states the pain is gradually intensified   Review of Systems  All other systems reviewed and are negative.       Objective:  Physical Exam Vitals signs and nursing note reviewed.  Constitutional:      Appearance: She is well-developed.  Pulmonary:     Effort: Pulmonary effort is normal.  Musculoskeletal: Normal range of motion.  Skin:    General: Skin is warm.  Neurological:     Mental Status: She is alert.     Neurovascular status intact muscle strength is adequate range of motion within normal limits.  Patient is found to have exquisite discomfort plantar aspect left heel at the insertional point of the tendon into the calcaneus with inflammation fluid around the medial band and mild discoloration of the nailbeds hallux bilateral with no pain associated with it.  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  Patient will be seen back to recheck  X-rays indicate small spur no indications of stress fracture arthritis

## 2018-08-25 ENCOUNTER — Ambulatory Visit: Payer: BC Managed Care – PPO | Admitting: Podiatry

## 2018-09-01 ENCOUNTER — Other Ambulatory Visit: Payer: Self-pay

## 2018-09-01 ENCOUNTER — Encounter: Payer: Self-pay | Admitting: Podiatry

## 2018-09-01 ENCOUNTER — Ambulatory Visit (INDEPENDENT_AMBULATORY_CARE_PROVIDER_SITE_OTHER): Payer: BC Managed Care – PPO | Admitting: Podiatry

## 2018-09-01 VITALS — Temp 98.1°F

## 2018-09-01 DIAGNOSIS — M722 Plantar fascial fibromatosis: Secondary | ICD-10-CM | POA: Diagnosis not present

## 2018-09-02 NOTE — Progress Notes (Signed)
Subjective:   Patient ID: Linda Olson, female   DOB: 58 y.o.   MRN: [id number]   HPI Patient presents stating the left foot is feeling better bracing helping and the great toenail she is concerned about the thickness discoloration   ROS      Objective:  Physical Exam  Significant diminishment of discomfort plantar aspect left heel insertional point tendon calcaneus with thick brittle nail bed right hallux that appears to be more trauma than actual fungus     Assessment:  Doing much better with plantar fasciitis with probable traumatized right hallux nail with possible secondary fungus     Plan:  Reviewed exercises anti-inflammatory physical therapy and for the nail I do not recommend treatment unless it were to become painful where it would need to be removed but I do not see a way we can get this better from the abnormal trauma which has occurred

## 2018-10-29 ENCOUNTER — Other Ambulatory Visit: Payer: Self-pay | Admitting: Podiatry

## 2018-11-03 DIAGNOSIS — R7303 Prediabetes: Secondary | ICD-10-CM | POA: Diagnosis not present

## 2018-11-03 DIAGNOSIS — I10 Essential (primary) hypertension: Secondary | ICD-10-CM | POA: Diagnosis not present

## 2018-11-03 DIAGNOSIS — E7800 Pure hypercholesterolemia, unspecified: Secondary | ICD-10-CM | POA: Diagnosis not present

## 2018-11-27 DIAGNOSIS — R7303 Prediabetes: Secondary | ICD-10-CM | POA: Diagnosis not present

## 2018-11-27 DIAGNOSIS — I10 Essential (primary) hypertension: Secondary | ICD-10-CM | POA: Diagnosis not present

## 2018-11-27 DIAGNOSIS — E7800 Pure hypercholesterolemia, unspecified: Secondary | ICD-10-CM | POA: Diagnosis not present

## 2019-01-01 DIAGNOSIS — M7062 Trochanteric bursitis, left hip: Secondary | ICD-10-CM | POA: Diagnosis not present

## 2019-01-01 DIAGNOSIS — Z23 Encounter for immunization: Secondary | ICD-10-CM | POA: Diagnosis not present

## 2019-01-07 ENCOUNTER — Other Ambulatory Visit: Payer: Self-pay | Admitting: Podiatry

## 2019-01-12 DIAGNOSIS — M7062 Trochanteric bursitis, left hip: Secondary | ICD-10-CM | POA: Diagnosis not present

## 2019-03-18 ENCOUNTER — Other Ambulatory Visit: Payer: Self-pay | Admitting: Podiatry

## 2019-03-18 ENCOUNTER — Observation Stay (HOSPITAL_COMMUNITY)
Admission: EM | Admit: 2019-03-18 | Discharge: 2019-03-20 | Disposition: A | Discharge status: Home / Self Care | Payer: BC Managed Care – PPO | Attending: Internal Medicine | Admitting: Internal Medicine

## 2019-03-18 ENCOUNTER — Other Ambulatory Visit: Payer: Self-pay

## 2019-03-18 ENCOUNTER — Emergency Department (HOSPITAL_COMMUNITY): Payer: BC Managed Care – PPO

## 2019-03-18 ENCOUNTER — Encounter (HOSPITAL_COMMUNITY): Payer: Self-pay | Admitting: Emergency Medicine

## 2019-03-18 DIAGNOSIS — E782 Mixed hyperlipidemia: Secondary | ICD-10-CM

## 2019-03-18 DIAGNOSIS — R0789 Other chest pain: Secondary | ICD-10-CM | POA: Diagnosis not present

## 2019-03-18 DIAGNOSIS — I10 Essential (primary) hypertension: Secondary | ICD-10-CM | POA: Insufficient documentation

## 2019-03-18 DIAGNOSIS — E119 Type 2 diabetes mellitus without complications: Secondary | ICD-10-CM | POA: Insufficient documentation

## 2019-03-18 DIAGNOSIS — Z79899 Other long term (current) drug therapy: Secondary | ICD-10-CM | POA: Insufficient documentation

## 2019-03-18 DIAGNOSIS — R9431 Abnormal electrocardiogram [ECG] [EKG]: Secondary | ICD-10-CM | POA: Insufficient documentation

## 2019-03-18 DIAGNOSIS — E785 Hyperlipidemia, unspecified: Secondary | ICD-10-CM | POA: Insufficient documentation

## 2019-03-18 DIAGNOSIS — Z20822 Contact with and (suspected) exposure to covid-19: Secondary | ICD-10-CM | POA: Diagnosis not present

## 2019-03-18 DIAGNOSIS — Z6841 Body Mass Index (BMI) 40.0 and over, adult: Secondary | ICD-10-CM | POA: Diagnosis not present

## 2019-03-18 DIAGNOSIS — E1169 Type 2 diabetes mellitus with other specified complication: Secondary | ICD-10-CM

## 2019-03-18 DIAGNOSIS — I2510 Atherosclerotic heart disease of native coronary artery without angina pectoris: Secondary | ICD-10-CM | POA: Diagnosis not present

## 2019-03-18 DIAGNOSIS — R079 Chest pain, unspecified: Secondary | ICD-10-CM | POA: Diagnosis not present

## 2019-03-18 DIAGNOSIS — Z791 Long term (current) use of non-steroidal anti-inflammatories (NSAID): Secondary | ICD-10-CM | POA: Insufficient documentation

## 2019-03-18 DIAGNOSIS — Z7984 Long term (current) use of oral hypoglycemic drugs: Secondary | ICD-10-CM | POA: Diagnosis not present

## 2019-03-18 LAB — CBC
HCT: 40.3 % (ref 36.0–46.0)
Hemoglobin: 12.9 g/dL (ref 12.0–15.0)
MCH: 30.8 pg (ref 26.0–34.0)
MCHC: 32 g/dL (ref 30.0–36.0)
MCV: 96.2 fL (ref 80.0–100.0)
Platelets: 327 10*3/uL (ref 150–400)
RBC: 4.19 MIL/uL (ref 3.87–5.11)
RDW: 11.6 % (ref 11.5–15.5)
WBC: 7.6 10*3/uL (ref 4.0–10.5)
nRBC: 0 % (ref 0.0–0.2)

## 2019-03-18 LAB — BASIC METABOLIC PANEL
Anion gap: 9 (ref 5–15)
BUN: 14 mg/dL (ref 6–20)
CO2: 27 mmol/L (ref 22–32)
Calcium: 9.5 mg/dL (ref 8.9–10.3)
Chloride: 102 mmol/L (ref 98–111)
Creatinine, Ser: 0.68 mg/dL (ref 0.44–1.00)
GFR calc Af Amer: >60 mL/min (ref >60)
GFR calc non Af Amer: >60 mL/min (ref >60)
Glucose, Bld: 112 mg/dL — ABNORMAL HIGH (ref 70–99)
Potassium: 3.7 mmol/L (ref 3.5–5.1)
Sodium: 138 mmol/L (ref 135–145)

## 2019-03-18 LAB — I-STAT BETA HCG BLOOD, ED (MC, WL, AP ONLY): I-stat hCG, quantitative: <5 m[IU]/mL (ref <5)

## 2019-03-18 LAB — TROPONIN I (HIGH SENSITIVITY): Troponin I (High Sensitivity): 4 ng/L (ref <18)

## 2019-03-18 MED ORDER — SODIUM CHLORIDE 0.9% FLUSH
3.0000 mL | Freq: Once | INTRAVENOUS | Status: AC
  Administered 2019-03-19: 3 mL via INTRAVENOUS

## 2019-03-18 NOTE — ED Triage Notes (Signed)
Pt reports intermittent chest pain for a month but worsened today. Central CP radiating to neck. Endorses HA

## 2019-03-19 ENCOUNTER — Observation Stay (HOSPITAL_COMMUNITY): Payer: BC Managed Care – PPO

## 2019-03-19 ENCOUNTER — Observation Stay (HOSPITAL_BASED_OUTPATIENT_CLINIC_OR_DEPARTMENT_OTHER): Payer: BC Managed Care – PPO

## 2019-03-19 DIAGNOSIS — R079 Chest pain, unspecified: Secondary | ICD-10-CM | POA: Diagnosis not present

## 2019-03-19 DIAGNOSIS — R9431 Abnormal electrocardiogram [ECG] [EKG]: Secondary | ICD-10-CM | POA: Diagnosis not present

## 2019-03-19 DIAGNOSIS — E119 Type 2 diabetes mellitus without complications: Secondary | ICD-10-CM | POA: Diagnosis not present

## 2019-03-19 DIAGNOSIS — Z20822 Contact with and (suspected) exposure to covid-19: Secondary | ICD-10-CM | POA: Diagnosis not present

## 2019-03-19 DIAGNOSIS — E785 Hyperlipidemia, unspecified: Secondary | ICD-10-CM | POA: Diagnosis not present

## 2019-03-19 DIAGNOSIS — E782 Mixed hyperlipidemia: Secondary | ICD-10-CM

## 2019-03-19 DIAGNOSIS — I10 Essential (primary) hypertension: Secondary | ICD-10-CM

## 2019-03-19 DIAGNOSIS — E1169 Type 2 diabetes mellitus with other specified complication: Secondary | ICD-10-CM

## 2019-03-19 LAB — HIV ANTIBODY (ROUTINE TESTING W REFLEX): HIV Screen 4th Generation wRfx: NONREACTIVE

## 2019-03-19 LAB — HEMOGLOBIN A1C
Hgb A1c MFr Bld: 6.2 % — ABNORMAL HIGH (ref 4.8–5.6)
Mean Plasma Glucose: 131.24 mg/dL

## 2019-03-19 LAB — CBG MONITORING, ED
Glucose-Capillary: 113 mg/dL — ABNORMAL HIGH (ref 70–99)
Glucose-Capillary: 100 mg/dL — ABNORMAL HIGH (ref 70–99)

## 2019-03-19 LAB — GLUCOSE, CAPILLARY
Glucose-Capillary: 77 mg/dL (ref 70–99)
Glucose-Capillary: 88 mg/dL (ref 70–99)

## 2019-03-19 LAB — LIPID PANEL
Cholesterol: 188 mg/dL (ref 0–200)
HDL: 54 mg/dL (ref >40)
LDL Cholesterol: 121 mg/dL — ABNORMAL HIGH (ref 0–99)
Total CHOL/HDL Ratio: 3.5 RATIO
Triglycerides: 64 mg/dL (ref <150)
VLDL: 13 mg/dL (ref 0–40)

## 2019-03-19 LAB — SARS CORONAVIRUS 2 (TAT 6-24 HRS): SARS Coronavirus 2: NEGATIVE

## 2019-03-19 LAB — TROPONIN I (HIGH SENSITIVITY): Troponin I (High Sensitivity): 6 ng/L (ref <18)

## 2019-03-19 LAB — D-DIMER, QUANTITATIVE: D-Dimer, Quant: 0.43 ug/mL-FEU (ref 0.00–0.50)

## 2019-03-19 LAB — ECHOCARDIOGRAM COMPLETE

## 2019-03-19 MED ORDER — ACETAMINOPHEN 325 MG PO TABS
650.0000 mg | ORAL_TABLET | ORAL | Status: DC | PRN
  Administered 2019-03-20: 650 mg via ORAL
  Filled 2019-03-19: qty 2

## 2019-03-19 MED ORDER — NITROGLYCERIN 0.4 MG SL SUBL: 0.4000 mg | SUBLINGUAL_TABLET | SUBLINGUAL | Status: DC | PRN

## 2019-03-19 MED ORDER — APPLE CIDER VINEGAR 500 MG PO TABS: 500.0000 mg | ORAL_TABLET | Freq: Every day | ORAL | Status: DC

## 2019-03-19 MED ORDER — NITROGLYCERIN 0.4 MG SL SUBL
SUBLINGUAL_TABLET | SUBLINGUAL | Status: AC
  Administered 2019-03-19: 0.8 mg via SUBLINGUAL
  Filled 2019-03-19: qty 2

## 2019-03-19 MED ORDER — ATORVASTATIN CALCIUM 10 MG PO TABS: 20.0000 mg | ORAL_TABLET | Freq: Every day | ORAL | Status: DC

## 2019-03-19 MED ORDER — OMEGA-3-ACID ETHYL ESTERS 1 G PO CAPS
1000.0000 mg | ORAL_CAPSULE | Freq: Every day | ORAL | Status: DC
  Administered 2019-03-20: 1000 mg via ORAL
  Filled 2019-03-19: qty 1

## 2019-03-19 MED ORDER — ACETAMINOPHEN 500 MG PO TABS
1000.0000 mg | ORAL_TABLET | Freq: Once | ORAL | Status: AC
  Administered 2019-03-19: 1000 mg via ORAL
  Filled 2019-03-19: qty 2

## 2019-03-19 MED ORDER — PRENATAL MULTIVITAMIN CH
1.0000 | ORAL_TABLET | Freq: Every day | ORAL | Status: DC
  Filled 2019-03-19 (×2): qty 1

## 2019-03-19 MED ORDER — METOPROLOL TARTRATE 12.5 MG HALF TABLET
12.5000 mg | ORAL_TABLET | Freq: Once | ORAL | Status: AC
  Administered 2019-03-19: 12.5 mg via ORAL
  Filled 2019-03-19: qty 1

## 2019-03-19 MED ORDER — INSULIN ASPART 100 UNIT/ML ~~LOC~~ SOLN: 0.0000 [IU] | Freq: Three times a day (TID) | SUBCUTANEOUS | Status: DC

## 2019-03-19 MED ORDER — INSULIN ASPART 100 UNIT/ML ~~LOC~~ SOLN: 0.0000 [IU] | Freq: Every day | SUBCUTANEOUS | Status: DC

## 2019-03-19 MED ORDER — ENOXAPARIN SODIUM 40 MG/0.4ML ~~LOC~~ SOLN
40.0000 mg | Freq: Every day | SUBCUTANEOUS | Status: DC
  Administered 2019-03-19 – 2019-03-20 (×2): 40 mg via SUBCUTANEOUS
  Filled 2019-03-19 (×2): qty 0.4

## 2019-03-19 MED ORDER — ASPIRIN 81 MG PO CHEW
324.0000 mg | CHEWABLE_TABLET | Freq: Once | ORAL | Status: AC
  Administered 2019-03-19: 324 mg via ORAL
  Filled 2019-03-19: qty 4

## 2019-03-19 MED ORDER — IOHEXOL 350 MG/ML SOLN
80.0000 mL | Freq: Once | INTRAVENOUS | Status: AC | PRN
  Administered 2019-03-19: 80 mL via INTRAVENOUS

## 2019-03-19 MED ORDER — ONDANSETRON HCL 4 MG/2ML IJ SOLN: 4.0000 mg | Freq: Four times a day (QID) | INTRAMUSCULAR | Status: DC | PRN

## 2019-03-19 MED ORDER — HYDROCHLOROTHIAZIDE 12.5 MG PO CAPS: 12.5000 mg | ORAL_CAPSULE | Freq: Every day | ORAL | Status: DC

## 2019-03-19 MED ORDER — ATORVASTATIN CALCIUM 40 MG PO TABS
40.0000 mg | ORAL_TABLET | Freq: Every day | ORAL | Status: DC
  Administered 2019-03-20: 40 mg via ORAL
  Filled 2019-03-19: qty 1

## 2019-03-19 MED ORDER — BLACK CURRANT SEED OIL 500 MG PO CAPS: 1000.0000 mg | ORAL_CAPSULE | Freq: Every day | ORAL | Status: DC

## 2019-03-19 MED ORDER — NITROGLYCERIN 0.4 MG SL SUBL: 0.8000 mg | SUBLINGUAL_TABLET | Freq: Once | SUBLINGUAL | Status: AC

## 2019-03-19 MED ORDER — VITAMIN D 25 MCG (1000 UNIT) PO TABS
1000.0000 [IU] | ORAL_TABLET | Freq: Every day | ORAL | Status: DC
  Administered 2019-03-20: 09:00:00 1000 [IU] via ORAL
  Filled 2019-03-19: qty 1

## 2019-03-19 MED ORDER — LOSARTAN POTASSIUM 50 MG PO TABS
50.0000 mg | ORAL_TABLET | Freq: Every day | ORAL | Status: DC
  Administered 2019-03-20: 50 mg via ORAL
  Filled 2019-03-19: qty 1

## 2019-03-19 MED ORDER — LOSARTAN POTASSIUM-HCTZ 50-12.5 MG PO TABS: 1.0000 | ORAL_TABLET | Freq: Every day | ORAL | Status: DC

## 2019-03-19 NOTE — Progress Notes (Signed)
  Echocardiogram 2D Echocardiogram has been performed.  Linda Olson 03/19/2019, 8:33 AM

## 2019-03-19 NOTE — ED Notes (Addendum)
Attempted report x 1 Was told RN receiving pt will look pt up and call

## 2019-03-19 NOTE — ED Notes (Signed)
Tele   Breakfast ordered  

## 2019-03-19 NOTE — Progress Notes (Signed)
Pt arrived to unit

## 2019-03-19 NOTE — ED Notes (Signed)
Per primary RN pt has been keeping family updated 

## 2019-03-19 NOTE — Progress Notes (Signed)
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

## 2019-03-19 NOTE — Progress Notes (Signed)
Patient placed in observation after midnight, please see H&P.  Here with chest pain.  CE negative.  Echo done but read is pending- cardiology consult placed last PM.  Patient NPO-- has not had breakfast this AM.  Will follow Eulogio Bear DO

## 2019-03-19 NOTE — ED Notes (Signed)
Pt transported to CT ?

## 2019-03-19 NOTE — ED Notes (Signed)
CBG 100 

## 2019-03-19 NOTE — Progress Notes (Signed)
PHARMACIST - PHYSICIAN ORDER COMMUNICATION  CONCERNING: P&T Medication Policy on Herbal Medications  DESCRIPTION:  This patient's order for:  Apple Cider Vinegar and Black Currant Seed Oil  has been noted.  This product(s) is classified as an "herbal" or natural product. Due to a lack of definitive safety studies or FDA approval, nonstandard manufacturing practices, plus the potential risk of unknown drug-drug interactions while on inpatient medications, the Pharmacy and Therapeutics Committee does not permit the use of "herbal" or natural products of this type within [facility].   ACTION TAKEN: The pharmacy department is unable to verify this order at this time and your patient has been informed of this safety policy. Please reevaluate patient's clinical condition at discharge and address if the herbal or natural product(s) should be resumed at that time.

## 2019-03-19 NOTE — ED Notes (Signed)
Pt ambulated self efficiently to restroom with no difficulty. Pt returned safely to bedside. 

## 2019-03-19 NOTE — ED Notes (Signed)
Notified CT pt ready for transport.

## 2019-03-19 NOTE — ED Provider Notes (Signed)
TIME SEEN: 2:37 AM  CHIEF COMPLAINT: Chest pain  HPI: Patient is a 59 year old female with history of obesity, hypertension, diabetes, hyperlipidemia who presents to the emergency department with chest pain.  States she has had intermittent chest pain for the past 2 weeks.  She states yesterday she had an episode that scared her.  She states that with these episodes she will have palpitations where she feels her heart is racing with intermittent sharp chest pains and pressure in the center of her chest that radiates up into her throat.  She will feel short of breath, nauseated, lightheaded like she is going to pass out.  No diaphoresis.  Today she had some tingling as well in the left hand.  States the symptoms are worse with exertion and better with rest.  She has never had an echocardiogram, cardiac catheterization or stress test.  Does not have a cardiologist.  Currently chest pain-free.  No fevers, cough, loss of taste or smell, vomiting or diarrhea, Covid exposures.  ROS: See HPI Constitutional: no fever  Eyes: no drainage  ENT: no runny nose   Cardiovascular:   chest pain  Resp:  SOB  GI: no vomiting GU: no dysuria Integumentary: no rash  Allergy: no hives  Musculoskeletal: no leg swelling  Neurological: no slurred speech ROS otherwise negative  PAST MEDICAL HISTORY/PAST SURGICAL HISTORY:  Past Medical History:  Diagnosis Date  . Hyperlipidemia     MEDICATIONS:  Prior to Admission medications   Medication Sig Start Date End Date Taking? Authorizing Provider  acetaminophen (TYLENOL) 500 MG tablet Take 500 mg every 6 (six) hours as needed by mouth (pain).    [provider]  diclofenac (VOLTAREN) 75 MG EC tablet TAKE 1 TABLET BY MOUTH TWICE A DAY 01/07/19   Wallene Huh, DPM  losartan-hydrochlorothiazide (HYZAAR) 50-12.5 MG tablet Take 1 tablet by mouth daily. 07/21/18   [provider]  metFORMIN (GLUCOPHAGE) 500 MG tablet  03/04/18   [provider]   naproxen sodium (ALEVE) 220 MG tablet Take 220 mg 2 (two) times daily as needed by mouth (pain).    [provider]    ALLERGIES:  Allergies  Allergen Reactions  . Other Other (See Comments)    Pain medication after back surgery in 2014 caused delirium - pt was told not to take again and to stay awake all night after taking    SOCIAL HISTORY:  Social History   Tobacco Use  . Smoking status: Never Smoker  . Smokeless tobacco: Never Used  Substance Use Topics  . Alcohol use: No    FAMILY HISTORY: Family History  Problem Relation Age of Onset  . Breast cancer Neg Hx     EXAM: BP 126/69 (BP Location: Right Arm)   Pulse 60   Temp 97.9 F (36.6 C) (Oral)   Resp 15   SpO2 100%  CONSTITUTIONAL: Alert and oriented and responds appropriately to questions. Well-appearing; well-nourished, obese HEAD: Normocephalic EYES: Conjunctivae clear, pupils appear equal, EOM appear intact ENT: normal nose; moist mucous membranes NECK: Supple, normal ROM CARD: RRR; S1 and S2 appreciated; no murmurs, no clicks, no rubs, no gallops RESP: Normal chest excursion without splinting or tachypnea; breath sounds clear and equal bilaterally; no wheezes, no rhonchi, no rales, no hypoxia or respiratory distress, speaking full sentences ABD/GI: Normal bowel sounds; non-distended; soft, non-tender, no rebound, no guarding, no peritoneal signs, no hepatosplenomegaly BACK:  The back appears normal EXT: Normal ROM in all joints; no deformity noted, no  edema; no cyanosis SKIN: Normal color for age and race; warm; no rash on exposed skin NEURO: Moves all extremities equally PSYCH: The patient's mood and manner are appropriate.   MEDICAL DECISION MAKING: Patient here with concerning story for chest pain.  She does have some atypical components as well.  She has never had any provocative testing and does not have a cardiologist.  Her EKG does show some nonspecific T wave changes diffusely but last EKG  for comparison was in 2011.  She has had 2 negative high-sensitivity troponins here.  She is chest pain-free currently.  Have low suspicion for PE or dissection.  She does have multiple risk factors for ACS.  Given this and her EKG abnormalities, will discuss with cardiology for further recommendations.  Heart score is 5.  ED PROGRESS: 2:35 AM  Spoke with Dr. Marletta Lor with cardiology.  She recommends admission to medicine for echocardiogram.  Cardiology will formally see patient once echocardiogram complete for further recommendations.  Patient may need stress test here in the hospital.  Patient also comfortable with this plan.  Will hold on heparin infusion at this time.  Cardiology agrees.  2:55 AM Discussed patient's case with hospitalist, Dr. Marlowe Sax.  I have recommended admission and patient (and family if present) agree with this plan. Admitting physician will place admission orders.   I reviewed all nursing notes, vitals, pertinent previous records and interpreted all EKGs, lab and urine results, imaging (as available).   CRITICAL CARE Performed by: Pryor Curia   Total critical care time: 40 minutes  Critical care time was exclusive of separately billable procedures and treating other patients.  Critical care was necessary to treat or prevent imminent or life-threatening deterioration.  Critical care was time spent personally by me on the following activities: development of treatment plan with patient and/or surrogate as well as nursing, discussions with consultants, evaluation of patient's response to treatment, examination of patient, obtaining history from patient or surrogate, ordering and performing treatments and interventions, ordering and review of laboratory studies, ordering and review of radiographic studies, pulse oximetry and re-evaluation of patient's condition.   NACOLE FULMORE was evaluated in Emergency Department on 03/19/2019 for the symptoms described in the history  of present illness. She was evaluated in the context of the global COVID-19 pandemic, which necessitated consideration that the patient might be at risk for infection with the SARS-CoV-2 virus that causes COVID-19. Institutional protocols and algorithms that pertain to the evaluation of patients at risk for COVID-19 are in a state of rapid change based on information released by regulatory bodies including the CDC and federal and state organizations. These policies and algorithms were followed during the patient's care in the ED.  Patient was seen wearing N95, face shield, gloves.    Shaneil Yazdi, Delice Bison, DO 03/19/19 [phone]

## 2019-03-19 NOTE — ED Notes (Signed)
Pt ambulated without assistance to restroom with no distress.

## 2019-03-19 NOTE — Consult Note (Signed)
CARDIOLOGY CONSULT NOTE       Patient ID: Linda Olson MRN: [id number] DOB/AGE: [birth year] 59 y.o.  Admit date: 03/18/2019 Referring Physician: Lorin Mercy Primary Physician: [name], MD Primary Cardiologist: New Reason for Consultation: Chest Pain  Principal Problem:   Chest pain Active Problems:   HTN (hypertension)   HLD (hyperlipidemia)   Type 2 diabetes mellitus (HCC)   HPI:  59 y.o. with CRF;s HTN, HLD and DM2. No previously documented CAD. Since January has had atypical chest pain. Pain is sharp not always exertional last minutes to hours Frequently improves with sleep and Tylenol. Worse episode today at work Some radiation to neck She also has been having more migraines lately. Compliant with meds Has not had previous stress testing. In ER now says pain "mostly" gone. Troponin negative x 2. D dimer negative CXR NAD.  ECG with inferior T wave inversions 3,F Has had CT with contrast before no reactions (2014 and multiple non contrast CT scans  Pain can also be associated with nausea and pre syncope   Patient is COVID negative   ROS All other systems reviewed and negative except as noted above  Past Medical History:  Diagnosis Date  . Hyperlipidemia     Family History  Problem Relation Age of Onset  . Breast cancer Neg Hx     Social History   Socioeconomic History  . Marital status: Single    Spouse name: Not on file  . Number of children: Not on file  . Years of education: Not on file  . Highest education level: Not on file  Occupational History  . Not on file  Tobacco Use  . Smoking status: Never Smoker  . Smokeless tobacco: Never Used  Substance and Sexual Activity  . Alcohol use: No  . Drug use: No  . Sexual activity: Not on file  Other Topics Concern  . Not on file  Social History Narrative  . Not on file   Social Determinants of Health   Financial Resource Strain:   . Difficulty of Paying Living Expenses: Not on file  Food Insecurity:     . Worried About Charity fundraiser in the Last Year: Not on file  . Ran Out of Food in the Last Year: Not on file  Transportation Needs:   . Lack of Transportation (Medical): Not on file  . Lack of Transportation (Non-Medical): Not on file  Physical Activity:   . Days of Exercise per Week: Not on file  . Minutes of Exercise per Session: Not on file  Stress:   . Feeling of Stress : Not on file  Social Connections:   . Frequency of Communication with Friends and Family: Not on file  . Frequency of Social Gatherings with Friends and Family: Not on file  . Attends Religious Services: Not on file  . Active Member of Clubs or Organizations: Not on file  . Attends Archivist Meetings: Not on file  . Marital Status: Not on file  Intimate Partner Violence:   . Fear of Current or Ex-Partner: Not on file  . Emotionally Abused: Not on file  . Physically Abused: Not on file  . Sexually Abused: Not on file    Past Surgical History:  Procedure Laterality Date  . BACK SURGERY    . VAGINAL HYSTERECTOMY        Current Facility-Administered Medications:  .  acetaminophen (TYLENOL) tablet 650 mg, 650 mg, Oral, Q4H PRN, Shela Leff, MD .  atorvastatin (  LIPITOR) tablet 20 mg, 20 mg, Oral, Daily, Shela Leff, MD .  cholecalciferol (VITAMIN D3) tablet 1,000 Units, 1,000 Units, Oral, Daily, Shela Leff, MD .  enoxaparin (LOVENOX) injection 40 mg, 40 mg, Subcutaneous, Daily, Shela Leff, MD, 40 mg at 03/19/19 1026 .  insulin aspart (novoLOG) injection 0-5 Units, 0-5 Units, Subcutaneous, QHS, Rathore, Vasundhra, MD .  insulin aspart (novoLOG) injection 0-9 Units, 0-9 Units, Subcutaneous, TID WC, Shela Leff, MD .  losartan (COZAAR) tablet 50 mg, 50 mg, Oral, Daily **AND** [DISCONTINUED] hydrochlorothiazide (MICROZIDE) capsule 12.5 mg, 12.5 mg, Oral, Daily, Shela Leff, MD .  metoprolol tartrate (LOPRESSOR) tablet 12.5 mg, 12.5 mg, Oral, Once, Josue Hector, MD .  nitroGLYCERIN (NITROSTAT) SL tablet 0.4 mg, 0.4 mg, Sublingual, Q5 min PRN, Shela Leff, MD .  omega-3 acid ethyl esters (LOVAZA) capsule 1,000 mg, 1,000 mg, Oral, Daily, Shela Leff, MD .  ondansetron (ZOFRAN) injection 4 mg, 4 mg, Intravenous, Q6H PRN, Shela Leff, MD .  prenatal multivitamin tablet 1 tablet, 1 tablet, Oral, Q1200, Shela Leff, MD  Current Outpatient Medications:  .  acetaminophen (TYLENOL) 500 MG tablet, Take 500 mg every 6 (six) hours as needed by mouth (pain)., Disp: , Rfl:  .  Apple Cider Vinegar 500 MG TABS, Take 500 mg by mouth daily., Disp: , Rfl:  .  atorvastatin (LIPITOR) 20 MG tablet, Take 20 mg by mouth daily., Disp: , Rfl:  .  Black Currant Seed Oil 500 MG CAPS, Take 1,000 mg by mouth daily., Disp: , Rfl:  .  cholecalciferol (VITAMIN D3) 25 MCG (1000 UNIT) tablet, Take 1,000 Units by mouth daily., Disp: , Rfl:  .  diclofenac (VOLTAREN) 75 MG EC tablet, TAKE 1 TABLET BY MOUTH TWICE A DAY, Disp: 50 tablet, Rfl: 2 .  losartan-hydrochlorothiazide (HYZAAR) 50-12.5 MG tablet, Take 1 tablet by mouth daily., Disp: , Rfl:  .  metFORMIN (GLUCOPHAGE) 500 MG tablet, Take 1,000 mg by mouth 2 (two) times daily with a meal. , Disp: , Rfl:  .  naproxen sodium (ALEVE) 220 MG tablet, Take 220 mg 2 (two) times daily as needed by mouth (pain)., Disp: , Rfl:  .  Omega-3 Fatty Acids (FISH OIL) 1000 MG CAPS, Take 1,000 mg by mouth daily., Disp: , Rfl:  .  Prenatal Vit-Fe Fumarate-FA (PRENATAL MULTIVITAMIN) TABS tablet, Take 1 tablet by mouth daily at 12 noon., Disp: , Rfl:  . atorvastatin  20 mg Oral Daily  . cholecalciferol  1,000 Units Oral Daily  . enoxaparin (LOVENOX) injection  40 mg Subcutaneous Daily  . insulin aspart  0-5 Units Subcutaneous QHS  . insulin aspart  0-9 Units Subcutaneous TID WC  . losartan  50 mg Oral Daily  . metoprolol tartrate  12.5 mg Oral Once  . omega-3 acid ethyl esters  1,000 mg Oral Daily  . prenatal  multivitamin  1 tablet Oral Q1200     Physical Exam: Blood pressure 125/67, pulse (!) 53, temperature 97.9 F (36.6 C), temperature source Oral, resp. rate 18, SpO2 98 %.   Affect appropriate Obese black female  HEENT: normal Neck supple with no adenopathy JVP normal no bruits no thyromegaly Lungs clear with no wheezing and good diaphragmatic motion Heart:  S1/S2 no murmur, no rub, gallop or click PMI normal Abdomen: benighn, BS positve, no tenderness, no AAA no bruit.  No HSM or HJR Distal pulses intact with no bruits No edema Neuro non-focal Skin warm and dry No muscular weakness   Labs:   Lab Results  Component Value Date   WBC 7.6 03/18/2019   HGB 12.9 03/18/2019   HCT 40.3 03/18/2019   MCV 96.2 03/18/2019   PLT 327 03/18/2019    Recent Labs  Lab 03/18/19 1755  NA 138  K 3.7  CL 102  CO2 27  BUN 14  CREATININE 0.68  CALCIUM 9.5  GLUCOSE 112*   No results found for: CKTOTAL, CKMB, CKMBINDEX, TROPONINI  Lab Results  Component Value Date   CHOL 188 03/19/2019   Lab Results  Component Value Date   HDL 54 03/19/2019   Lab Results  Component Value Date   LDLCALC 121 (H) 03/19/2019   Lab Results  Component Value Date   TRIG 64 03/19/2019   Lab Results  Component Value Date   CHOLHDL 3.5 03/19/2019   No results found for: LDLDIRECT    Radiology: DG Chest 2 View  Result Date: 03/18/2019 CLINICAL DATA:  Chest pain EXAM: CHEST - 2 VIEW COMPARISON:  Radiograph 01/26/2014 FINDINGS: No consolidation, features of edema, pneumothorax, or effusion. Pulmonary vascularity is normally distributed. The cardiomediastinal contours are unremarkable. No acute osseous or soft tissue abnormality. Degenerative changes are present in the imaged spine and shoulders. IMPRESSION: No acute cardiopulmonary abnormality. Electronically Signed   By: Lovena Le M.D.   On: 03/18/2019 19:28   ECHOCARDIOGRAM COMPLETE  Result Date: 03/19/2019   ECHOCARDIOGRAM REPORT   Patient  Name:   JACQUISE GAGE Angert Date of Exam: 03/19/2019 Medical Rec #:  [id number]           Height:       60.0 in Accession #:    [id number]          Weight:       215.0 lb Date of Birth:  1960-05-29           BSA:          1.92 m Patient Age:    75 years            BP:           121/75 mmHg Patient Gender: F                   HR:           60 bpm. Exam Location:  Inpatient Procedure: 2D Echo Indications:    Chest Pain 786.50/R07.9  History:        Patient has no prior history of Echocardiogram examinations.                 Risk Factors:Hypertension, Diabetes and Dyslipidemia.  Sonographer:    Clayton Lefort RDCS (AE) Referring Phys: [id number] [facility]  Sonographer Comments: Patient is morbidly obese. Image acquisition challenging due to patient body habitus. IMPRESSIONS  1. Left ventricular ejection fraction, by visual estimation, is 60 to 65%. The left ventricle has normal function. There is mildly increased left ventricular hypertrophy. Grossly normal LV function without definite regional wall motion abnormalities.  2. Global right ventricle has normal systolic function.The right ventricular size is normal. No increase in right ventricular wall thickness.  3. Left atrial size was normal.  4. Right atrial size was normal.  5. The mitral valve is normal in structure. Trivial mitral valve regurgitation. No evidence of mitral stenosis.  6. The tricuspid valve is normal in structure. No significant TR.  7. The aortic valve is tricuspid. Aortic valve regurgitation is not visualized. Mild aortic valve sclerosis without stenosis.  8. The pulmonic valve was normal in  structure. Pulmonic valve regurgitation is trivial.  9. The inferior vena cava is normal in size with greater than 50% respiratory variability, suggesting right atrial pressure of 3 mmHg. 10. Aortic dilatation noted. 11. There is mild dilatation of the ascending aorta measuring 39 mm. FINDINGS  Left Ventricle: Left ventricular ejection fraction, by visual  estimation, is 60 to 65%. The left ventricle has normal function. The left ventricle is not well visualized. There is mildly increased left ventricular hypertrophy. Left ventricular diastolic  parameters were normal. Normal left atrial pressure. Right Ventricle: The right ventricular size is normal. No increase in right ventricular wall thickness. Global RV systolic function is has normal systolic function. Left Atrium: Left atrial size was normal in size. Right Atrium: Right atrial size was normal in size Pericardium: There is no evidence of pericardial effusion. Mitral Valve: The mitral valve is normal in structure. Trivial mitral valve regurgitation. No evidence of mitral valve stenosis by observation. Tricuspid Valve: The tricuspid valve is normal in structure. Tricuspid valve regurgitation is not demonstrated. Aortic Valve: The aortic valve is tricuspid. Aortic valve regurgitation is not visualized. Mild aortic valve sclerosis is present, with no evidence of aortic valve stenosis. Aortic valve mean gradient measures 5.0 mmHg. Aortic valve peak gradient measures 9.9 mmHg. Aortic valve area, by VTI measures 2.56 cm. Pulmonic Valve: The pulmonic valve was normal in structure. Pulmonic valve regurgitation is trivial. Pulmonic regurgitation is trivial. Aorta: The aortic root is normal in size and structure and aortic dilatation noted. There is mild dilatation of the ascending aorta measuring 39 mm. Venous: The inferior vena cava is normal in size with greater than 50% respiratory variability, suggesting right atrial pressure of 3 mmHg. IAS/Shunts: The interatrial septum was not well visualized.  LEFT VENTRICLE PLAX 2D LVIDd:         4.66 cm  Diastology LVIDs:         3.00 cm  LV e' lateral:   10.70 cm/s LV PW:         1.22 cm  LV E/e' lateral: 6.3 LV IVS:        1.40 cm  LV e' medial:    8.41 cm/s LVOT diam:     2.00 cm  LV E/e' medial:  8.1 LV SV:         65 ml LV SV Index:   31.27 LVOT Area:     3.14 cm  RIGHT  VENTRICLE             IVC RV Basal diam:  3.60 cm     IVC diam: 1.84 cm RV Mid diam:    2.60 cm RV S prime:     12.20 cm/s TAPSE (M-mode): 1.7 cm LEFT ATRIUM             Index       RIGHT ATRIUM           Index LA diam:        3.50 cm 1.82 cm/m  RA Area:     14.90 cm LA Vol (A2C):   74.7 ml 38.81 ml/m RA Volume:   36.80 ml  19.12 ml/m LA Vol (A4C):   31.2 ml 16.21 ml/m LA Biplane Vol: 52.3 ml 27.17 ml/m  AORTIC VALVE AV Area (Vmax):    2.44 cm AV Area (Vmean):   2.24 cm AV Area (VTI):     2.56 cm AV Vmax:           157.00 cm/s AV Vmean:  106.000 cm/s AV VTI:            0.368 m AV Peak Grad:      9.9 mmHg AV Mean Grad:      5.0 mmHg LVOT Vmax:         122.00 cm/s LVOT Vmean:        75.700 cm/s LVOT VTI:          0.300 m LVOT/AV VTI ratio: 0.82  AORTA Ao Root diam: 3.20 cm Ao Asc diam:  3.90 cm MITRAL VALVE MV Area (PHT): 1.98 cm             SHUNTS MV PHT:        111.36 msec          Systemic VTI:  0.30 m MV Decel Time: 384 msec             Systemic Diam: 2.00 cm MV E velocity: 67.90 cm/s 103 cm/s MV A velocity: 32.20 cm/s 70.3 cm/s MV E/A ratio:  2.11       1.5  Cherlynn Kaiser MD Electronically signed by Cherlynn Kaiser MD Signature Date/Time: 03/19/2019/11:01:41 AM    Final     EKG: SR T wave inversions 3,F    ASSESSMENT AND PLAN:   1. Chest Pain:  Atypical negative troponin x 2 negative D dimer CXR NAD but CRF;s HTN, HLD and DM2 with abnormal ECG Too late in day for myovue and with obesity would likely need 2 day study. Will try to arrange cardiac CTA for today 18 g iv may be a challenge Lopressor 25 mg 2 hours before CT if HR >60 bpm. Discussed procedure including contrast and need for iv and nitro with patient willing to proceed Cr is normal  Also reviewed her echo that was just done EF 60-65% no RWMAls and no significant valve disease Normal RV size and function no evidence pulmonary HTN  2.  HTN:  Low in ER hold diuretic for contrast   3. HLD  Continue statin   4. DM:  Discussed  low carb diet.  Target hemoglobin A1c is 6.5 or less.  Continue current medications.   SignedJenkins Rouge 03/19/2019, 11:19 AM

## 2019-03-19 NOTE — H&P (Signed)
History and Physical    DOREAN CARL [id number] DOB: 10-May-1960 DOA: 03/18/2019  PCP: [name], MD Patient coming from: Home  Chief Complaint: Chest pain  HPI: Linda Olson is a 59 y.o. female with medical history significant of hypertension, hyperlipidemia, non-insulin-dependent diabetes, obesity presenting to the ED for evaluation of chest pain.  Patient states she has been having exertional chest pain on and off since the beginning of this month.  Yesterday the pain was much worse and made her come into the ED.  She was walking at work and experienced sharp substernal chest pain associated with dyspnea and nausea.  Chest pain radiated to her neck and it felt like she was choking.  She felt like she was going to pass out but did not lose consciousness.  She sat down and symptoms improved after 20 minutes.  Denies personal history of CAD or prior MI.  Denies family history of CAD.  Denies history of tobacco use.  Denies history of blood clots.  ED Course: Hemodynamically stable.  EKG with T wave abnormality in inferior and lateral leads.  High-sensitivity troponin negative x2.  Chest x-ray showing no active cardiopulmonary abnormality. Patient received aspirin 324 mg and Tylenol.  ED provider discussed the case with cardiology who recommended holding off heparin infusion at this time and doing an echocardiogram in the morning.  Review of Systems:  All systems reviewed and apart from history of presenting illness, are negative.  Past Medical History:  Diagnosis Date  . Hyperlipidemia     Past Surgical History:  Procedure Laterality Date  . BACK SURGERY    . VAGINAL HYSTERECTOMY       reports that she has never smoked. She has never used smokeless tobacco. She reports that she does not drink alcohol or use drugs.  Allergies  Allergen Reactions  . Other Other (See Comments)    OXYCODONE Pain medication after back surgery in 2014 caused delirium - pt was told not to  take again and to stay awake all night after taking    Family History  Problem Relation Age of Onset  . Breast cancer Neg Hx     Prior to Admission medications   Medication Sig Start Date End Date Taking? Authorizing Provider  acetaminophen (TYLENOL) 500 MG tablet Take 500 mg every 6 (six) hours as needed by mouth (pain).   Yes [provider]  Apple Cider Vinegar 500 MG TABS Take 500 mg by mouth daily.   Yes [provider]  atorvastatin (LIPITOR) 20 MG tablet Take 20 mg by mouth daily. 02/22/19  Yes [provider]  Black Currant Seed Oil 500 MG CAPS Take 1,000 mg by mouth daily.   Yes [provider]  cholecalciferol (VITAMIN D3) 25 MCG (1000 UNIT) tablet Take 1,000 Units by mouth daily.   Yes [provider]  diclofenac (VOLTAREN) 75 MG EC tablet TAKE 1 TABLET BY MOUTH TWICE A DAY 01/07/19  Yes Regal, Tamala Fothergill, DPM  losartan-hydrochlorothiazide (HYZAAR) 50-12.5 MG tablet Take 1 tablet by mouth daily. 07/21/18  Yes [provider]  metFORMIN (GLUCOPHAGE) 500 MG tablet Take 1,000 mg by mouth 2 (two) times daily with a meal.  03/04/18  Yes [provider]  naproxen sodium (ALEVE) 220 MG tablet Take 220 mg 2 (two) times daily as needed by mouth (pain).   Yes [provider]  Omega-3 Fatty Acids (FISH OIL) 1000 MG CAPS Take 1,000 mg by mouth daily.   Yes [provider]  Prenatal Vit-Fe Fumarate-FA (PRENATAL MULTIVITAMIN) TABS tablet Take 1 tablet by mouth daily at 12 noon.   Yes [provider]    Physical Exam: Vitals:   03/19/19 0245 03/19/19 0345 03/19/19 0445 03/19/19 0500  BP: 130/77 (!) 146/70 125/64 109/66  Pulse: (!) 57   (!) 47  Resp: 19   14  Temp:      TempSrc:      SpO2: 100% 100%  97%    Physical Exam  Constitutional: She is oriented to person, place, and time. She appears well-developed and well-nourished. No distress.  HENT:  Head: Normocephalic.  Eyes: Right eye exhibits no  discharge. Left eye exhibits no discharge.  Cardiovascular: Normal rate, regular rhythm and intact distal pulses.  Pulmonary/Chest: Effort normal and breath sounds normal. No respiratory distress. She has no wheezes. She has no rales.  Abdominal: Soft. Bowel sounds are normal. She exhibits no distension. There is no abdominal tenderness. There is no guarding.  Musculoskeletal:        General: No edema.     Cervical back: Neck supple.  Neurological: She is alert and oriented to person, place, and time.  Skin: Skin is warm and dry. She is not diaphoretic.     Labs on Admission: I have personally reviewed following labs and imaging studies  CBC: Recent Labs  Lab 03/18/19 1755  WBC 7.6  HGB 12.9  HCT 40.3  MCV 96.2  PLT [id number]   Basic Metabolic Panel: Recent Labs  Lab 03/18/19 1755  NA 138  K 3.7  CL 102  CO2 27  GLUCOSE 112*  BUN 14  CREATININE 0.68  CALCIUM 9.5   GFR: CrCl cannot be calculated (Unknown ideal weight.). Liver Function Tests: No results for input(s): AST, ALT, ALKPHOS, BILITOT, PROT, ALBUMIN in the last 168 hours. No results for input(s): LIPASE, AMYLASE in the last 168 hours. No results for input(s): AMMONIA in the last 168 hours. Coagulation Profile: No results for input(s): INR, PROTIME in the last 168 hours. Cardiac Enzymes: No results for input(s): CKTOTAL, CKMB, CKMBINDEX, TROPONINI in the last 168 hours. BNP (last 3 results) No results for input(s): PROBNP in the last 8760 hours. HbA1C: No results for input(s): HGBA1C in the last 72 hours. CBG: No results for input(s): GLUCAP in the last 168 hours. Lipid Profile: No results for input(s): CHOL, HDL, LDLCALC, TRIG, CHOLHDL, LDLDIRECT in the last 72 hours. Thyroid Function Tests: No results for input(s): TSH, T4TOTAL, FREET4, T3FREE, THYROIDAB in the last 72 hours. Anemia Panel: No results for input(s): VITAMINB12, FOLATE, FERRITIN, TIBC, IRON, RETICCTPCT in the last 72 hours. Urine analysis:     Component Value Date/Time   COLORURINE YELLOW 02/14/2010 [place] 02/14/2010 1027   LABSPEC 1.019 02/14/2010 1027   PHURINE 6.5 02/14/2010 1027   GLUCOSEU NEGATIVE 02/14/2010 1027   HGBUR NEGATIVE 02/14/2010 1027   BILIRUBINUR NEGATIVE 02/14/2010 1027   KETONESUR NEGATIVE 02/14/2010 1027   PROTEINUR NEGATIVE 02/14/2010 1027   UROBILINOGEN 0.2 02/14/2010 1027   NITRITE NEGATIVE 02/14/2010 1027   LEUKOCYTESUR SMALL (A) 02/14/2010 1027    Radiological Exams on Admission: DG Chest 2 View  Result Date: 03/18/2019 CLINICAL DATA:  Chest pain EXAM: CHEST - 2 VIEW COMPARISON:  Radiograph 01/26/2014 FINDINGS: No consolidation, features of edema, pneumothorax, or effusion. Pulmonary vascularity is normally distributed. The cardiomediastinal contours are unremarkable. No acute osseous or soft tissue abnormality. Degenerative changes are present in the imaged spine and shoulders. IMPRESSION: No acute cardiopulmonary abnormality. Electronically  Signed   By: Lovena Le M.D.   On: 03/18/2019 19:28    EKG: Independently reviewed.  Sinus rhythm.  T wave abnormality in inferior and lateral leads.  No prior tracing for comparison.  Assessment/Plan Principal Problem:   Chest pain Active Problems:   HTN (hypertension)   HLD (hyperlipidemia)   Type 2 diabetes mellitus (HCC)  Chest pain Patient reports having exertional angina for the past 2 weeks, symptoms much worse yesterday which prompted her to come into the ED to be evaluated.  Hemodynamically stable.  EKG with T wave abnormality in inferior and lateral leads.  High-sensitivity troponin negative x2.  Chest x-ray showing no active cardiopulmonary abnormality.  Currently chest pain-free.  Does have significant risk factors for CAD.  Although chest pain is sharp in character, PE less likely given no tachycardia, tachypnea, or hypoxia.  ED provider discussed the case with cardiology who recommended holding off heparin infusion at this  time and doing an echocardiogram in the morning. -Cardiac monitoring -Received full dose aspirin in the ED -Sublingual nitroglycerin as needed -Echocardiogram -Check D-dimer level -EKG as needed for recurrence of chest pain  Hypertension Currently normotensive. -Continue home losartan-hydrochlorothiazide  Hyperlipidemia -Continue home Lipitor 20 mg daily -Check lipid panel  Non-insulin-dependent diabetes -Check A1c.  Sliding scale insulin sensitive before meals and at bedtime.  CBG checks.  DVT prophylaxis: Lovenox Code Status: Full code Family Communication: No family at bedside. Disposition Plan: Anticipate discharge after clinical improvement. Consults called: Cardiology (Dr. Marletta Lor) Admission status: It is my clinical opinion that referral for OBSERVATION is reasonable and necessary in this patient based on the above information provided. The aforementioned taken together are felt to place the patient at high risk for further clinical deterioration. However it is anticipated that the patient may be medically stable for discharge from the hospital within 24 to 48 hours.  The medical decision making on this patient was of high complexity and the patient is at high risk for clinical deterioration, therefore this is a level 3 visit.  Shela Leff MD [facility] Pager [phone]  If 7PM-7AM, please contact night-coverage [email or web address] Password [email or web address]  03/19/2019, 5:09 AM

## 2019-03-20 DIAGNOSIS — R079 Chest pain, unspecified: Secondary | ICD-10-CM | POA: Diagnosis not present

## 2019-03-20 LAB — GLUCOSE, CAPILLARY
Glucose-Capillary: 110 mg/dL — ABNORMAL HIGH (ref 70–99)
Glucose-Capillary: 79 mg/dL (ref 70–99)

## 2019-03-20 MED ORDER — ATORVASTATIN CALCIUM 40 MG PO TABS: 40.0000 mg | ORAL_TABLET | Freq: Every day | ORAL | 1 refills | Status: DC

## 2019-03-20 NOTE — Discharge Summary (Signed)
Physician Discharge Summary  Linda Olson [id number] DOB: [birth year] DOA: 03/18/2019  PCP: [name], MD  Admit date: 03/18/2019 Discharge date: 03/20/2019  Time spent: 45 minutes  Recommendations for Outpatient Follow-up:  1. Follow-up with PCP 2 to 3 weeks for evaluation of symptoms, risk factor modifications, blood pressure control, diabetes control 2. CTA in 6 months to be arranged by cardiology 3. Stress test every 3 years per cardiology recommendations   Discharge Diagnoses:  Principal Problem:   Chest pain Active Problems:   HTN (hypertension)   HLD (hyperlipidemia)   Type 2 diabetes mellitus ([place])   Discharge Condition: Stable chest pain-free  Diet recommendation: Heart healthy/carb modified  Filed Weights   03/19/19 1530 03/20/19 0542  Weight: 104.8 kg 104.8 kg    History of present illness:  Linda Olson is a 59 y.o. female with medical history significant of hypertension, hyperlipidemia, non-insulin-dependent diabetes, obesity presented 1/13 to the ED for evaluation of chest pain.  Patient stated she had been having exertional chest pain on and off since the beginning of this month.    The day prior the pain was much worse and made her come into the ED.  She was walking at work and experienced sharp substernal chest pain associated with dyspnea and nausea.  Chest pain radiated to her neck and it felt like she was choking.  She felt like she was going to pass out but did not lose consciousness.  She sat down and symptoms improved after 20 minutes.  Denied personal history of CAD or prior MI.  Denied family history of CAD.  Denied history of tobacco use.  Denies history of blood clots.  Hospital Course:  Chest pain. Patient reported having exertional angina for the prior 2 weeks, symptoms much worse day prior which prompted her to come into the ED to be evaluated.  Hemodynamically stable.  EKG with T wave abnormality in inferior and lateral leads.   High-sensitivity troponin negative x2.  Chest x-ray showing no active cardiopulmonary abnormality.  Echo with an EF of 60 to 65% and normal left ventricular function. Does have significant risk factors for CAD.  Evaluated by cardiology who opined atypical chest pain with abnormal EKG. 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  Recommended having a stress test every 3 years.  Increased statin.  Follow-up with PCP for blood pressure control and risk factor modifications  Hypertension.  Fair control. Continue home losartan-hydrochlorothiazide  Hyperlipidemia.  LDL target is 70 so her Lipitor was increased to 40 mg per cardiology.  Recommends lipid panel in 3 months  Non-insulin-dependent diabetes.  Hemoglobin A1c 6.2.  Outpatient follow-up.  Procedures:  Coronary CTA  Consultations:  nishan cards  Discharge Exam: Vitals:   03/19/19 2113 03/20/19 0539  BP: 135/85 133/65  Pulse: (!) 52 (!) 53  Resp:  19  Temp: 98.5 F (36.9 C) 98.4 F (36.9 C)  SpO2: 95% 99%    General: Awake alert oriented no acute distress Cardiovascular: Regular rate and rhythm no murmur gallop or rub no lower extremity edema Respiratory: Normal effort breath sounds clear bilaterally no wheezing no rhonchi  Discharge Instructions   Discharge Instructions    Call MD for:  difficulty breathing, headache or visual disturbances   Complete by: As directed    Call MD for:  persistant dizziness or light-headedness   Complete by: As directed    Call MD for:  temperature >100.4   Complete by: As directed    Diet - low  sodium heart healthy   Complete by: As directed    Discharge instructions   Complete by: As directed    Follow up with PCP 1-2 weeks for evaluation of BP control and risk factor modification Track LDL Cardiology arranging for follow up CTA in 6 months Stress test every 3 years   Increase activity slowly   Complete by: As directed      Allergies as of 03/20/2019       Reactions   Other Other (See Comments)   OXYCODONE Pain medication after back surgery in 2014 caused delirium - pt was told not to take again and to stay awake all night after taking      Medication List    TAKE these medications   acetaminophen 500 MG tablet Commonly known as: TYLENOL Take 500 mg every 6 (six) hours as needed by mouth (pain).   Apple Cider Vinegar 500 MG Tabs Take 500 mg by mouth daily.   atorvastatin 40 MG tablet Commonly known as: LIPITOR Take 1 tablet (40 mg total) by mouth daily. Start taking on: March 21, 2019 What changed:   medication strength  how much to take   [place] Currant Seed Oil 500 MG Caps Take 1,000 mg by mouth daily.   cholecalciferol 25 MCG (1000 UNIT) tablet Commonly known as: VITAMIN D3 Take 1,000 Units by mouth daily.   diclofenac 75 MG EC tablet Commonly known as: VOLTAREN TAKE 1 TABLET BY MOUTH TWICE A DAY   Fish Oil 1000 MG Caps Take 1,000 mg by mouth daily.   losartan-hydrochlorothiazide 50-12.5 MG tablet Commonly known as: HYZAAR Take 1 tablet by mouth daily.   metFORMIN 500 MG tablet Commonly known as: GLUCOPHAGE Take 1,000 mg by mouth 2 (two) times daily with a meal.   naproxen sodium 220 MG tablet Commonly known as: ALEVE Take 220 mg 2 (two) times daily as needed by mouth (pain).   prenatal multivitamin Tabs tablet Take 1 tablet by mouth daily at 12 noon.      Allergies  Allergen Reactions  . Other Other (See Comments)    OXYCODONE Pain medication after back surgery in 2014 caused delirium - pt was told not to take again and to stay awake all night after taking      The results of significant diagnostics from this hospitalization (including imaging, microbiology, ancillary and laboratory) are listed below for reference.    Significant Diagnostic Studies: DG Chest 2 View  Result Date: 03/18/2019 CLINICAL DATA:  Chest pain EXAM: CHEST - 2 VIEW COMPARISON:  Radiograph 01/26/2014 FINDINGS: No  consolidation, features of edema, pneumothorax, or effusion. Pulmonary vascularity is normally distributed. The cardiomediastinal contours are unremarkable. No acute osseous or soft tissue abnormality. Degenerative changes are present in the imaged spine and shoulders. IMPRESSION: No acute cardiopulmonary abnormality. Electronically Signed   By: Lovena Le M.D.   On: 03/18/2019 19:28   CT CORONARY MORPH W/CTA COR W/SCORE W/CA W/CM &/OR WO/CM  Addendum Date: 03/19/2019   ADDENDUM REPORT: 03/19/2019 15:07 CLINICAL DATA:  Chest pain EXAM: Cardiac CTA MEDICATIONS: Sub lingual nitro. 4 mg and lopressor 25mg  oral TECHNIQUE: The patient was scanned on a [employer] 192 scanner. Gantry rotation speed was 250 msecs. Collimation was. 6 mm . A 120 kV prospective scan was triggered in the ascending thoracic aorta at 140 HU's with full mA between 30-70% of the R-R interval . Average HR during the scan was 59 bpm. The 3D data set was interpreted on a dedicated work station  using MPR, MIP and VRT modes. A total of 80 cc of contrast was used. FINDINGS: Non-cardiac: See separate report from [facility]. No significant findings on limited lung and soft tissue windows. Calcium score: Calcium noted in the ostial and proximal LAD Coronary Arteries: Right dominant with no anomalies LM: Normal LAD: 1-24% calcific plaque D1: Normal D2: Not well visualized Circumflex: Normal OM1: Normal AV Groove: Not well visualized RCA: Normal PDA: Not well visualized PLA: Small vessel normal IMPRESSION: 1. 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 Calcium score 104 involving LAD which is 95 th percentile for age and sex 45. No obstructive CAD noted poor visualization of D2 and distal RCA/Circumflex due to patient body habitus Study will be sent for FFR CT [place] Electronically Signed   By: Jenkins Rouge M.D.   On: 03/19/2019 15:07   Result Date: 03/19/2019 EXAM: OVER-READ INTERPRETATION  CT CHEST The  following report is an over-read performed by radiologist Dr. Vinnie Langton of [facility], [place] on 03/19/2019. This over-read does not include interpretation of cardiac or coronary anatomy or pathology. The coronary calcium score/coronary CTA interpretation by the cardiologist is attached. COMPARISON:  None. FINDINGS: Ectasia of ascending thoracic aorta (4.2 cm in diameter). Within the visualized portions of the thorax there are no suspicious appearing pulmonary nodules or masses, there is no acute consolidative airspace disease, no pleural effusions, no pneumothorax and no lymphadenopathy. Visualized portions of the upper abdomen are unremarkable. There are no aggressive appearing lytic or blastic lesions noted in the visualized portions of the skeleton. IMPRESSION: 1. 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 Recommend annual imaging followup by CTA or MRA. This recommendation follows 2010 ACCF/AHA/AATS/ACR/ASA/SCA/SCAI/SIR/STS/SVM Guidelines for the Diagnosis and Management of Patients with Thoracic Aortic Disease. Circulation. 2010; [id number]. Aortic aneurysm NOS (ICD10-I71.9). Electronically Signed: By: Vinnie Langton M.D. On: 03/19/2019 14:12   CT CORONARY FRACTIONAL FLOW RESERVE FLUID ANALYSIS  Result Date: 03/20/2019 CLINICAL DATA:  CAD EXAM: FFR CT TECHNIQUE: The best systolic and diastolic phases of the patients gated cardiac CTA sent to [facility] for hemodynamic analysis FINDINGS: No significant flow limiting lesions All FFR CT values greater than 0.91 in major epicardial coronary vessels IMPRESSION: Normal FFR CT [place] Electronically Signed   By: Jenkins Rouge M.D.   On: 03/20/2019 07:43   ECHOCARDIOGRAM COMPLETE  Result Date: 03/19/2019   ECHOCARDIOGRAM REPORT   Patient Name:   MAEBRY MITNICK Soy Date of Exam: 03/19/2019 Medical Rec #:  [id number]           Height:       60.0 in Accession #:    [id number]          Weight:       215.0 lb Date of Birth:   June 26, 1960           BSA:          1.92 m Patient Age:    59 years            BP:           121/75 mmHg Patient Gender: F                   HR:           60 bpm. Exam Location:  Inpatient Procedure: 2D Echo Indications:    Chest Pain 786.50/R07.9  History:        Patient has no prior history of Echocardiogram examinations.  Risk Factors:Hypertension, Diabetes and Dyslipidemia.  Sonographer:    Clayton Lefort RDCS (AE) Referring Phys: [id number] [facility]  Sonographer Comments: Patient is morbidly obese. Image acquisition challenging due to patient body habitus. IMPRESSIONS  1. Left ventricular ejection fraction, by visual estimation, is 60 to 65%. The left ventricle has normal function. There is mildly increased left ventricular hypertrophy. Grossly normal LV function without definite regional wall motion abnormalities.  2. Global right ventricle has normal systolic function.The right ventricular size is normal. No increase in right ventricular wall thickness.  3. Left atrial size was normal.  4. Right atrial size was normal.  5. The mitral valve is normal in structure. Trivial mitral valve regurgitation. No evidence of mitral stenosis.  6. The tricuspid valve is normal in structure. No significant TR.  7. The aortic valve is tricuspid. Aortic valve regurgitation is not visualized. Mild aortic valve sclerosis without stenosis.  8. The pulmonic valve was normal in structure. Pulmonic valve regurgitation is trivial.  9. The inferior vena cava is normal in size with greater than 50% respiratory variability, suggesting right atrial pressure of 3 mmHg. 10. Aortic dilatation noted. 11. There is mild dilatation of the ascending aorta measuring 39 mm. FINDINGS  Left Ventricle: Left ventricular ejection fraction, by visual estimation, is 60 to 65%. The left ventricle has normal function. The left ventricle is not well visualized. There is mildly increased left ventricular hypertrophy. Left ventricular  diastolic  parameters were normal. Normal left atrial pressure. Right Ventricle: The right ventricular size is normal. No increase in right ventricular wall thickness. Global RV systolic function is has normal systolic function. Left Atrium: Left atrial size was normal in size. Right Atrium: Right atrial size was normal in size Pericardium: There is no evidence of pericardial effusion. Mitral Valve: The mitral valve is normal in structure. Trivial mitral valve regurgitation. No evidence of mitral valve stenosis by observation. Tricuspid Valve: The tricuspid valve is normal in structure. Tricuspid valve regurgitation is not demonstrated. Aortic Valve: The aortic valve is tricuspid. Aortic valve regurgitation is not visualized. Mild aortic valve sclerosis is present, with no evidence of aortic valve stenosis. Aortic valve mean gradient measures 5.0 mmHg. Aortic valve peak gradient measures 9.9 mmHg. Aortic valve area, by VTI measures 2.56 cm. Pulmonic Valve: The pulmonic valve was normal in structure. Pulmonic valve regurgitation is trivial. Pulmonic regurgitation is trivial. Aorta: The aortic root is normal in size and structure and aortic dilatation noted. There is mild dilatation of the ascending aorta measuring 39 mm. Venous: The inferior vena cava is normal in size with greater than 50% respiratory variability, suggesting right atrial pressure of 3 mmHg. IAS/Shunts: The interatrial septum was not well visualized.  LEFT VENTRICLE PLAX 2D LVIDd:         4.66 cm  Diastology LVIDs:         3.00 cm  LV e' lateral:   10.70 cm/s LV PW:         1.22 cm  LV E/e' lateral: 6.3 LV IVS:        1.40 cm  LV e' medial:    8.41 cm/s LVOT diam:     2.00 cm  LV E/e' medial:  8.1 LV SV:         65 ml LV SV Index:   31.27 LVOT Area:     3.14 cm  RIGHT VENTRICLE             IVC RV Basal diam:  3.60 cm  IVC diam: 1.84 cm RV Mid diam:    2.60 cm RV S prime:     12.20 cm/s TAPSE (M-mode): 1.7 cm LEFT ATRIUM             Index        RIGHT ATRIUM           Index LA diam:        3.50 cm 1.82 cm/m  RA Area:     14.90 cm LA Vol (A2C):   74.7 ml 38.81 ml/m RA Volume:   36.80 ml  19.12 ml/m LA Vol (A4C):   31.2 ml 16.21 ml/m LA Biplane Vol: 52.3 ml 27.17 ml/m  AORTIC VALVE AV Area (Vmax):    2.44 cm AV Area (Vmean):   2.24 cm AV Area (VTI):     2.56 cm AV Vmax:           157.00 cm/s AV Vmean:          106.000 cm/s AV VTI:            0.368 m AV Peak Grad:      9.9 mmHg AV Mean Grad:      5.0 mmHg LVOT Vmax:         122.00 cm/s LVOT Vmean:        75.700 cm/s LVOT VTI:          0.300 m LVOT/AV VTI ratio: 0.82  AORTA Ao Root diam: 3.20 cm Ao Asc diam:  3.90 cm MITRAL VALVE MV Area (PHT): 1.98 cm             SHUNTS MV PHT:        111.36 msec          Systemic VTI:  0.30 m MV Decel Time: 384 msec             Systemic Diam: 2.00 cm MV E velocity: 67.90 cm/s 103 cm/s MV A velocity: 32.20 cm/s 70.3 cm/s MV E/A ratio:  2.11       1.5  Cherlynn Kaiser MD Electronically signed by Cherlynn Kaiser MD Signature Date/Time: 03/19/2019/11:01:41 AM    Final     Microbiology: Recent Results (from the past 240 hour(s))  SARS CORONAVIRUS 2 (TAT 6-24 HRS) Nasopharyngeal Nasopharyngeal Swab     Status: None   Collection Time: 03/19/19  3:02 AM   Specimen: Nasopharyngeal Swab  Result Value Ref Range Status   SARS Coronavirus 2 NEGATIVE NEGATIVE Final    Comment: (NOTE) SARS-CoV-2 target nucleic acids are NOT DETECTED. The SARS-CoV-2 RNA is generally detectable in upper and lower respiratory specimens during the acute phase of infection. Negative results do not preclude SARS-CoV-2 infection, do not rule out co-infections with other pathogens, and should not be used as the sole basis for treatment or other patient management decisions. Negative results must be combined with clinical observations, patient history, and epidemiological information. The expected result is Negative. Fact Sheet for  Patients: SugarRoll.be Fact Sheet for Healthcare Providers: https://www.woods-mathews.com/ This test is not yet approved or cleared by the [place] FDA and  has been authorized for detection and/or diagnosis of SARS-CoV-2 by FDA under an Emergency Use Authorization (EUA). This EUA will remain  in effect (meaning this test can be used) for the duration of the COVID-19 declaration under Section 56 4(b)(1) of the Act, 21 U.S.C. section 360bbb-3(b)(1), unless the authorization is terminated or revoked sooner. Performed at [facility], [place] [address] [place], [place] [address]  Labs: Basic Metabolic Panel: Recent Labs  Lab 03/18/19 1755  NA 138  K 3.7  CL 102  CO2 27  GLUCOSE 112*  BUN 14  CREATININE 0.68  CALCIUM 9.5   Liver Function Tests: No results for input(s): AST, ALT, ALKPHOS, BILITOT, PROT, ALBUMIN in the last 168 hours. No results for input(s): LIPASE, AMYLASE in the last 168 hours. No results for input(s): AMMONIA in the last 168 hours. CBC: Recent Labs  Lab 03/18/19 1755  WBC 7.6  HGB 12.9  HCT 40.3  MCV 96.2  PLT 327   Cardiac Enzymes: No results for input(s): CKTOTAL, CKMB, CKMBINDEX, TROPONINI in the last 168 hours. BNP: BNP (last 3 results) No results for input(s): BNP in the last 8760 hours.  ProBNP (last 3 results) No results for input(s): PROBNP in the last 8760 hours.  CBG: Recent Labs  Lab 03/19/19 0751 03/19/19 1211 03/19/19 1645 03/19/19 2114 03/20/19 0739  GLUCAP 113* 100* 77 88 110*       Signed:  Radene Gunning NP  [facility] 03/20/2019, 8:48 AM

## 2019-03-20 NOTE — Progress Notes (Signed)
Subjective:  Denies SSCP, palpitations or Dyspnea Headache better   Objective:  Vitals:   03/19/19 1530 03/19/19 2113 03/20/19 0539 03/20/19 0542  BP: (!) 143/82 135/85 133/65   Pulse: (!) 51 (!) 52 (!) 53   Resp:   19   Temp: 98.9 F (37.2 C) 98.5 F (36.9 C) 98.4 F (36.9 C)   TempSrc: Oral Oral Oral   SpO2: 98% 95% 99%   Weight: 104.8 kg   104.8 kg  Height: 5' (1.524 m)       Intake/Output from previous day:  Intake/Output Summary (Last 24 hours) at 03/20/2019 0751 Last data filed at 03/20/2019 0535 Gross per 24 hour  Intake 240 ml  Output 600 ml  Net -360 ml    Physical Exam:  Affect appropriate Obese black female  HEENT: normal Neck supple with no adenopathy JVP normal no bruits no thyromegaly Lungs clear with no wheezing and good diaphragmatic motion Heart:  S1/S2 no murmur, no rub, gallop or click PMI normal Abdomen: benighn, BS positve, no tenderness, no AAA no bruit.  No HSM or HJR Distal pulses intact with no bruits No edema Neuro non-focal Skin warm and dry No muscular weakness   Lab Results: Basic Metabolic Panel: Recent Labs    03/18/19 1755  NA 138  K 3.7  CL 102  CO2 27  GLUCOSE 112*  BUN 14  CREATININE 0.68  CALCIUM 9.5    No results for input(s): LIPASE, AMYLASE in the last 72 hours. CBC: Recent Labs    03/18/19 1755  WBC 7.6  HGB 12.9  HCT 40.3  MCV 96.2  PLT 327   Cardiac Enzymes: No results for input(s): CKTOTAL, CKMB, CKMBINDEX, TROPONINI in the last 72 hours. BNP: Invalid input(s): POCBNP D-Dimer: Recent Labs    03/19/19 0617  DDIMER 0.43   Hemoglobin A1C: Recent Labs    03/19/19 0503  HGBA1C 6.2*   Fasting Lipid Panel: Recent Labs    03/19/19 0505  CHOL 188  HDL 54  LDLCALC 121*  TRIG 64  CHOLHDL 3.5    Imaging: DG Chest 2 View  Result Date: 03/18/2019 CLINICAL DATA:  Chest pain EXAM: CHEST - 2 VIEW COMPARISON:  Radiograph 01/26/2014 FINDINGS: No consolidation, features of edema,  pneumothorax, or effusion. Pulmonary vascularity is normally distributed. The cardiomediastinal contours are unremarkable. No acute osseous or soft tissue abnormality. Degenerative changes are present in the imaged spine and shoulders. IMPRESSION: No acute cardiopulmonary abnormality. Electronically Signed   By: Lovena Le M.D.   On: 03/18/2019 19:28   CT CORONARY MORPH W/CTA COR W/SCORE W/CA W/CM &/OR WO/CM  Addendum Date: 03/19/2019   ADDENDUM REPORT: 03/19/2019 15:07 CLINICAL DATA:  Chest pain EXAM: Cardiac CTA MEDICATIONS: Sub lingual nitro. 4 mg and lopressor 25mg  oral TECHNIQUE: The patient was scanned on a [employer] 192 scanner. Gantry rotation speed was 250 msecs. Collimation was. 6 mm . A 120 kV prospective scan was triggered in the ascending thoracic aorta at 140 HU's with full mA between 30-70% of the R-R interval . Average HR during the scan was 59 bpm. The 3D data set was interpreted on a dedicated work station using MPR, MIP and VRT modes. A total of 80 cc of contrast was used. FINDINGS: Non-cardiac: See separate report from [facility]. No significant findings on limited lung and soft tissue windows. Calcium score: Calcium noted in the ostial and proximal LAD Coronary Arteries: Right dominant with no anomalies LM: Normal LAD: 1-24% calcific plaque D1:  Normal D2: Not well visualized Circumflex: Normal OM1: Normal AV Groove: Not well visualized RCA: Normal PDA: Not well visualized PLA: Small vessel normal IMPRESSION: 1. 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 Calcium score 104 involving LAD which is 95 th percentile for age and sex 16. No obstructive CAD noted poor visualization of D2 and distal RCA/Circumflex due to patient body habitus Study will be sent for FFR CT [place] Electronically Signed   By: Jenkins Rouge M.D.   On: 03/19/2019 15:07   Result Date: 03/19/2019 EXAM: OVER-READ INTERPRETATION  CT CHEST The following report is an over-read  performed by radiologist Dr. Vinnie Langton of [facility], [place] on 03/19/2019. This over-read does not include interpretation of cardiac or coronary anatomy or pathology. The coronary calcium score/coronary CTA interpretation by the cardiologist is attached. COMPARISON:  None. FINDINGS: Ectasia of ascending thoracic aorta (4.2 cm in diameter). Within the visualized portions of the thorax there are no suspicious appearing pulmonary nodules or masses, there is no acute consolidative airspace disease, no pleural effusions, no pneumothorax and no lymphadenopathy. Visualized portions of the upper abdomen are unremarkable. There are no aggressive appearing lytic or blastic lesions noted in the visualized portions of the skeleton. IMPRESSION: 1. 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 Recommend annual imaging followup by CTA or MRA. This recommendation follows 2010 ACCF/AHA/AATS/ACR/ASA/SCA/SCAI/SIR/STS/SVM Guidelines for the Diagnosis and Management of Patients with Thoracic Aortic Disease. Circulation. 2010; [id number]. Aortic aneurysm NOS (ICD10-I71.9). Electronically Signed: By: Vinnie Langton M.D. On: 03/19/2019 14:12   CT CORONARY FRACTIONAL FLOW RESERVE FLUID ANALYSIS  Result Date: 03/20/2019 CLINICAL DATA:  CAD EXAM: FFR CT TECHNIQUE: The best systolic and diastolic phases of the patients gated cardiac CTA sent to [facility] for hemodynamic analysis FINDINGS: No significant flow limiting lesions All FFR CT values greater than 0.91 in major epicardial coronary vessels IMPRESSION: Normal FFR CT [place] Electronically Signed   By: Jenkins Rouge M.D.   On: 03/20/2019 07:43   ECHOCARDIOGRAM COMPLETE  Result Date: 03/19/2019   ECHOCARDIOGRAM REPORT   Patient Name:   Linda Olson Date of Exam: 03/19/2019 Medical Rec #:  [id number]           Height:       60.0 in Accession #:    [id number]          Weight:       215.0 lb Date of Birth:  [birth year]           BSA:          1.92  m Patient Age:    59 years            BP:           121/75 mmHg Patient Gender: F                   HR:           60 bpm. Exam Location:  Inpatient Procedure: 2D Echo Indications:    Chest Pain 786.50/R07.9  History:        Patient has no prior history of Echocardiogram examinations.                 Risk Factors:Hypertension, Diabetes and Dyslipidemia.  Sonographer:    Clayton Lefort RDCS (AE) Referring Phys: [id number] [facility]  Sonographer Comments: Patient is morbidly obese. Image acquisition challenging due to patient body habitus. IMPRESSIONS  1. Left ventricular ejection fraction, by  visual estimation, is 60 to 65%. The left ventricle has normal function. There is mildly increased left ventricular hypertrophy. Grossly normal LV function without definite regional wall motion abnormalities.  2. Global right ventricle has normal systolic function.The right ventricular size is normal. No increase in right ventricular wall thickness.  3. Left atrial size was normal.  4. Right atrial size was normal.  5. The mitral valve is normal in structure. Trivial mitral valve regurgitation. No evidence of mitral stenosis.  6. The tricuspid valve is normal in structure. No significant TR.  7. The aortic valve is tricuspid. Aortic valve regurgitation is not visualized. Mild aortic valve sclerosis without stenosis.  8. The pulmonic valve was normal in structure. Pulmonic valve regurgitation is trivial.  9. The inferior vena cava is normal in size with greater than 50% respiratory variability, suggesting right atrial pressure of 3 mmHg. 10. Aortic dilatation noted. 11. There is mild dilatation of the ascending aorta measuring 39 mm. FINDINGS  Left Ventricle: Left ventricular ejection fraction, by visual estimation, is 60 to 65%. The left ventricle has normal function. The left ventricle is not well visualized. There is mildly increased left ventricular hypertrophy. Left ventricular diastolic  parameters were normal. Normal left  atrial pressure. Right Ventricle: The right ventricular size is normal. No increase in right ventricular wall thickness. Global RV systolic function is has normal systolic function. Left Atrium: Left atrial size was normal in size. Right Atrium: Right atrial size was normal in size Pericardium: There is no evidence of pericardial effusion. Mitral Valve: The mitral valve is normal in structure. Trivial mitral valve regurgitation. No evidence of mitral valve stenosis by observation. Tricuspid Valve: The tricuspid valve is normal in structure. Tricuspid valve regurgitation is not demonstrated. Aortic Valve: The aortic valve is tricuspid. Aortic valve regurgitation is not visualized. Mild aortic valve sclerosis is present, with no evidence of aortic valve stenosis. Aortic valve mean gradient measures 5.0 mmHg. Aortic valve peak gradient measures 9.9 mmHg. Aortic valve area, by VTI measures 2.56 cm. Pulmonic Valve: The pulmonic valve was normal in structure. Pulmonic valve regurgitation is trivial. Pulmonic regurgitation is trivial. Aorta: The aortic root is normal in size and structure and aortic dilatation noted. There is mild dilatation of the ascending aorta measuring 39 mm. Venous: The inferior vena cava is normal in size with greater than 50% respiratory variability, suggesting right atrial pressure of 3 mmHg. IAS/Shunts: The interatrial septum was not well visualized.  LEFT VENTRICLE PLAX 2D LVIDd:         4.66 cm  Diastology LVIDs:         3.00 cm  LV e' lateral:   10.70 cm/s LV PW:         1.22 cm  LV E/e' lateral: 6.3 LV IVS:        1.40 cm  LV e' medial:    8.41 cm/s LVOT diam:     2.00 cm  LV E/e' medial:  8.1 LV SV:         65 ml LV SV Index:   31.27 LVOT Area:     3.14 cm  RIGHT VENTRICLE             IVC RV Basal diam:  3.60 cm     IVC diam: 1.84 cm RV Mid diam:    2.60 cm RV S prime:     12.20 cm/s TAPSE (M-mode): 1.7 cm LEFT ATRIUM             Index  RIGHT ATRIUM           Index LA diam:         3.50 cm 1.82 cm/m  RA Area:     14.90 cm LA Vol (A2C):   74.7 ml 38.81 ml/m RA Volume:   36.80 ml  19.12 ml/m LA Vol (A4C):   31.2 ml 16.21 ml/m LA Biplane Vol: 52.3 ml 27.17 ml/m  AORTIC VALVE AV Area (Vmax):    2.44 cm AV Area (Vmean):   2.24 cm AV Area (VTI):     2.56 cm AV Vmax:           157.00 cm/s AV Vmean:          106.000 cm/s AV VTI:            0.368 m AV Peak Grad:      9.9 mmHg AV Mean Grad:      5.0 mmHg LVOT Vmax:         122.00 cm/s LVOT Vmean:        75.700 cm/s LVOT VTI:          0.300 m LVOT/AV VTI ratio: 0.82  AORTA Ao Root diam: 3.20 cm Ao Asc diam:  3.90 cm MITRAL VALVE MV Area (PHT): 1.98 cm             SHUNTS MV PHT:        111.36 msec          Systemic VTI:  0.30 m MV Decel Time: 384 msec             Systemic Diam: 2.00 cm MV E velocity: 67.90 cm/s 103 cm/s MV A velocity: 32.20 cm/s 70.3 cm/s MV E/A ratio:  2.11       1.5  Cherlynn Kaiser MD Electronically signed by Cherlynn Kaiser MD Signature Date/Time: 03/19/2019/11:01:41 AM    Final     Cardiac Studies:  ECG: SR T inversion 3,F   Telemetry:  NSR  Echo: EF 60-65% no valve disease personally reviewed  Medications:   . atorvastatin  40 mg Oral Daily  . cholecalciferol  1,000 Units Oral Daily  . enoxaparin (LOVENOX) injection  40 mg Subcutaneous Daily  . insulin aspart  0-5 Units Subcutaneous QHS  . insulin aspart  0-9 Units Subcutaneous TID WC  . losartan  50 mg Oral Daily  . omega-3 acid ethyl esters  1,000 mg Oral Daily  . prenatal multivitamin  1 tablet Oral Q1200      Assessment/Plan:   1. Chest Pain:  Reviewed both patients cardiac CTA and FFRCT from [facility]. 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 HLD:  Have increased lipitor to 40 mg daily as target LDL is 70 or less f/u with primary for  repeat labs in 3 months   3. Aorta:  Ectasia root size 4.2 cm needs f/u CTA in 6 months Control BP  4. HTN:  .Well controlled.  Continue current medications and low sodium Dash type diet.    5. DM:  Discussed low carb diet.  Target hemoglobin A1c is 6.5 or less.  Continue current medications.  Patient can be d/c home today does not need routine cardiology f/u can f/u with primary for risk factor modification And arranging CTA in 6 months   Linda Olson  Linda Olson 03/20/2019, 7:51 AM

## 2019-04-03 ENCOUNTER — Telehealth: Payer: Self-pay | Admitting: Cardiovascular Disease

## 2019-04-03 DIAGNOSIS — E785 Hyperlipidemia, unspecified: Secondary | ICD-10-CM | POA: Diagnosis not present

## 2019-04-03 DIAGNOSIS — R7303 Prediabetes: Secondary | ICD-10-CM | POA: Diagnosis not present

## 2019-04-03 DIAGNOSIS — E7800 Pure hypercholesterolemia, unspecified: Secondary | ICD-10-CM | POA: Diagnosis not present

## 2019-04-03 DIAGNOSIS — R079 Chest pain, unspecified: Secondary | ICD-10-CM | POA: Diagnosis not present

## 2019-04-03 NOTE — Telephone Encounter (Signed)
She is not f/u with cardiology regularly so primary can order CTA in 6 months and if change in aneurysm refer to [facility]

## 2019-04-03 NOTE — Telephone Encounter (Signed)
Pt was in the hospital, and based on AVS  The pt will need another CT in 6 Mo to be set up by Cardiology. Dr. Johnsie Cancel was listed as her care provider in the hospital, but she is not established with Dr. Johnsie Cancel in clinic  Philip wants to know if Dr. Johnsie Cancel will set that up or if [facility] needs to set it up. Please let Sadie Haber know who needs to put in orders

## 2019-04-03 NOTE — Telephone Encounter (Signed)
Will forward to Dr. Nishan for advisement. ?

## 2019-04-06 NOTE — Telephone Encounter (Signed)
Dr. Ammie Ferrier nurse Manuela Schwartz called back. Informed her of Dr. Kyla Balzarine message. Manuela Schwartz thanked me for the call.

## 2019-04-06 NOTE — Telephone Encounter (Signed)
Left message for Dr. Ammie Ferrier office to call back.

## 2019-04-08 ENCOUNTER — Ambulatory Visit: Payer: BC Managed Care – PPO | Attending: Internal Medicine

## 2019-04-08 DIAGNOSIS — Z20822 Contact with and (suspected) exposure to covid-19: Secondary | ICD-10-CM | POA: Diagnosis not present

## 2019-04-09 LAB — NOVEL CORONAVIRUS, NAA: SARS-CoV-2, NAA: NOT DETECTED

## 2019-05-14 DIAGNOSIS — E7800 Pure hypercholesterolemia, unspecified: Secondary | ICD-10-CM | POA: Diagnosis not present

## 2019-05-14 DIAGNOSIS — R7303 Prediabetes: Secondary | ICD-10-CM | POA: Diagnosis not present

## 2019-07-09 ENCOUNTER — Other Ambulatory Visit: Payer: Self-pay | Admitting: Family Medicine

## 2019-07-09 DIAGNOSIS — Z1231 Encounter for screening mammogram for malignant neoplasm of breast: Secondary | ICD-10-CM

## 2019-09-07 DIAGNOSIS — E161 Other hypoglycemia: Secondary | ICD-10-CM | POA: Diagnosis not present

## 2019-09-07 DIAGNOSIS — E162 Hypoglycemia, unspecified: Secondary | ICD-10-CM | POA: Diagnosis not present

## 2019-09-07 DIAGNOSIS — I959 Hypotension, unspecified: Secondary | ICD-10-CM | POA: Diagnosis not present

## 2019-09-07 DIAGNOSIS — R52 Pain, unspecified: Secondary | ICD-10-CM | POA: Diagnosis not present

## 2019-09-29 DIAGNOSIS — M7918 Myalgia, other site: Secondary | ICD-10-CM | POA: Diagnosis not present

## 2019-10-06 ENCOUNTER — Encounter: Payer: Self-pay | Admitting: Orthopaedic Surgery

## 2019-10-06 ENCOUNTER — Ambulatory Visit: Payer: Self-pay

## 2019-10-06 ENCOUNTER — Other Ambulatory Visit: Payer: Self-pay

## 2019-10-06 ENCOUNTER — Ambulatory Visit (INDEPENDENT_AMBULATORY_CARE_PROVIDER_SITE_OTHER): Payer: BC Managed Care – PPO | Admitting: Orthopaedic Surgery

## 2019-10-06 VITALS — BP 126/87 | HR 95 | Ht 60.0 in | Wt 227.2 lb

## 2019-10-06 DIAGNOSIS — S40011A Contusion of right shoulder, initial encounter: Secondary | ICD-10-CM

## 2019-10-06 DIAGNOSIS — M25559 Pain in unspecified hip: Secondary | ICD-10-CM | POA: Diagnosis not present

## 2019-10-06 DIAGNOSIS — M545 Low back pain, unspecified: Secondary | ICD-10-CM

## 2019-10-06 DIAGNOSIS — Z6841 Body Mass Index (BMI) 40.0 and over, adult: Secondary | ICD-10-CM

## 2019-10-06 DIAGNOSIS — W19XXXA Unspecified fall, initial encounter: Secondary | ICD-10-CM | POA: Insufficient documentation

## 2019-10-06 DIAGNOSIS — Z981 Arthrodesis status: Secondary | ICD-10-CM

## 2019-10-06 NOTE — Addendum Note (Signed)
Addended by: Minda Ditto, Alyse Low N on: 10/06/2019 11:56 AM   Modules accepted: Orders

## 2019-10-06 NOTE — Progress Notes (Signed)
Office Visit Note   Patient: Linda Olson           Date of Birth: 1960/10/16           MRN: [id number] Visit Date: 10/06/2019              Requested by: [name], [place],  [place] [id number] PCP: [name], MD   Assessment & Plan: Visit Diagnoses:  1. Hip pain   2. Low back pain, unspecified back pain laterality, unspecified chronicity, unspecified whether sciatica present   3. Body mass index 40.0-44.9, adult ([place])   4. History of lumbar fusion   5. Fall, initial encounter   6. Contusion of right shoulder, initial encounter            On 09/07/19  Plan: We will set patient up for some physical therapy of her shoulder evaluation and treatment with decreased internal rotation post fall with shoulder contusion.  They can work on evaluation with core strengthening her back and hip pain.  She is neurologically intact.  We discussed getting back to [employer] where she is a member and working on work activities that do not seem to exacerbate her pain she can do some elliptical, treadmill use the bike etc..  We discussed benefits of decreased symptoms both with diabetes, hypertension, potential heart disease problems all with weight loss.  She understands this would also unload her back.  She can follow-up with me in 5 weeks.  Follow-Up Instructions: Return in about 5 weeks (around 11/10/2019).   Orders:  Orders Placed This Encounter  Procedures  . XR Lumbar Spine 2-3 Views  . XR HIPS BILAT W OR W/O PELVIS 3-4 VIEWS   No orders of the defined types were placed in this encounter.     Procedures: No procedures performed   Clinical Data: No additional findings.   Subjective: Chief Complaint  Patient presents with  . Lower Back - Pain    S/p fall on 09/07/19  . Right Hip - Pain    S/p fall on 09/07/19  . Left Hip - Pain    S/p fall on 09/07/19    HPI 59 year old female with previous fusion by me L5-S1 for grade 2 spondylolisthesis has been  doing well I am not seen her in many years when she fell the mall inside on 09/07/2019 when she slipped on some wet floor.  She states she has had back pain and lower hip pain since that time no numbness or tingling in her feet.  She had seen Dr. Theadore Nan who had recommended therapy.  She returns here today for x-rays and evaluation.  Patient does have morbid obesity BMI 44.  States she was going to the gym but has not gone since she fell.  She had increased pain in her right shoulder after the fall.  She is able get her arm up overhead and get dressed.  Patient has prediabetes also hypertension hyperlipidemia 95th percentile calcium score on CT of the heart and has been followed by cardiology.  Patient states prior to the fall her back been doing well.  Patient has had some cervical disc degeneration noted on previous imaging at C5-6.  Review of Systems all other systems are negative is obtains HPI other than as mentioned above.   Objective: Vital Signs: BP 126/87 (BP Location: Left Arm, Patient Position: Sitting)   Pulse 95   Ht 5' (1.524 m)   Wt 227 lb 3.2  oz (103.1 kg)   BMI 44.37 kg/m   Physical Exam Constitutional:      Appearance: She is well-developed.  HENT:     Head: Normocephalic.     Right Ear: External ear normal.     Left Ear: External ear normal.  Eyes:     Pupils: Pupils are equal, round, and reactive to light.  Neck:     Thyroid: No thyromegaly.     Trachea: No tracheal deviation.  Cardiovascular:     Rate and Rhythm: Normal rate.  Pulmonary:     Effort: Pulmonary effort is normal.  Abdominal:     Palpations: Abdomen is soft.  Skin:    General: Skin is warm and dry.  Neurological:     Mental Status: She is alert and oriented to person, place, and time.  Psychiatric:        Behavior: Behavior normal.     Ortho Exam normal heel toe gait she can heel walk toe walk.  Negative logroll to the hips no hip flexion contracture knees reach full extension negative  popliteal compression test knee and ankle jerk are 1+ and symmetrical peroneals are strong.  Patient has limitation internal rotation she can reach this past posterior axillary line for the right shoulder.  Good abduction negative impingement.  Good cervical range of motion.  Specialty Comments:  No specialty comments available.  Imaging: XR HIPS BILAT W OR W/O PELVIS 3-4 VIEWS  Result Date: 10/06/2019 AP pelvis did show both hips and frog-leg lateral x-rays demonstrate normal joint space negative for acute changes.  L5-S1 fusion noted better visualized on lumbar films. Impression: Hip x-rays negative for acute changes.  No significant degenerative changes.  XR Lumbar Spine 2-3 Views  Result Date: 10/06/2019 AP lateral lumbar spine x-rays are obtained and reviewed.  This shows instrumented L5-S1 fusion solid with grade 1 anterolisthesis.  Negative for acute changes.  Mild facet degenerative changes L4-5. Impression: Solid L5-S1 fusion.  Facet degenerative changes L4-5.    PMFS History: Patient Active Problem List   Diagnosis Date Noted  . History of lumbar fusion 10/06/2019  . Fall 10/06/2019  . Contusion of right shoulder 10/06/2019  . Chest pain 03/19/2019  . HTN (hypertension) 03/19/2019  . HLD (hyperlipidemia) 03/19/2019  . Type 2 diabetes mellitus ([place]) 03/19/2019  . Pre-diabetes 11/12/2017   Past Medical History:  Diagnosis Date  . Hyperlipidemia     Family History  Problem Relation Age of Onset  . Breast cancer Neg Hx     Past Surgical History:  Procedure Laterality Date  . BACK SURGERY    . VAGINAL HYSTERECTOMY     Social History   Occupational History  . Not on file  Tobacco Use  . Smoking status: Never Smoker  . Smokeless tobacco: Never Used  Substance and Sexual Activity  . Alcohol use: No  . Drug use: No  . Sexual activity: Not on file

## 2019-10-27 ENCOUNTER — Ambulatory Visit (INDEPENDENT_AMBULATORY_CARE_PROVIDER_SITE_OTHER): Payer: BC Managed Care – PPO | Admitting: Rehabilitative and Restorative Service Providers"

## 2019-10-27 ENCOUNTER — Other Ambulatory Visit: Payer: Self-pay

## 2019-10-27 ENCOUNTER — Encounter: Payer: Self-pay | Admitting: Rehabilitative and Restorative Service Providers"

## 2019-10-27 DIAGNOSIS — R293 Abnormal posture: Secondary | ICD-10-CM

## 2019-10-27 DIAGNOSIS — M6281 Muscle weakness (generalized): Secondary | ICD-10-CM

## 2019-10-27 DIAGNOSIS — M25511 Pain in right shoulder: Secondary | ICD-10-CM | POA: Diagnosis not present

## 2019-10-27 DIAGNOSIS — R262 Difficulty in walking, not elsewhere classified: Secondary | ICD-10-CM

## 2019-10-27 DIAGNOSIS — G8929 Other chronic pain: Secondary | ICD-10-CM

## 2019-10-27 DIAGNOSIS — M545 Low back pain, unspecified: Secondary | ICD-10-CM

## 2019-10-27 NOTE — Therapy (Addendum)
[place], [place], [address] Phone: [phone]   Fax:  [phone]  Physical Therapy Evaluation  Patient Details  Name: Linda Olson MRN: [id number] Date of Birth: [birth year] Referring Provider (PT): Dr. Lorin Mercy   Encounter Date: 10/27/2019   PT End of Session - 10/27/19 1642    Visit Number 1    Number of Visits 12    Date for PT Re-Evaluation 12/22/19    Progress Note Due on Visit 10    PT Start Time 1610    PT Stop Time 1640    PT Time Calculation (min) 30 min    Activity Tolerance Patient tolerated treatment well    Behavior During Therapy [facility] for tasks assessed/performed           Past Medical History:  Diagnosis Date  . Hyperlipidemia     Past Surgical History:  Procedure Laterality Date  . BACK SURGERY    . VAGINAL HYSTERECTOMY      There were no vitals filed for this visit.    Subjective Assessment - 10/27/19 1613    Subjective Pt. stated [place] and [place] floor.  Pt. stated falling on Lt side and a few weeks later her back started to hurt more and well as shoulder.  Pt. stated seeing MD about complaints.    Pertinent History Fusion L5, S1    Diagnostic tests xrays back and shoulder    Patient Stated Goals Reduce pain    Currently in Pain? Yes    Pain Score 8     Pain Location Back    Pain Descriptors / Indicators Aching;Tightness;Sore    Pain Onset More than a month ago    Pain Frequency Intermittent    Aggravating Factors  prolonged sitting, bending, lifting, prolonged walking    Pain Relieving Factors heating pads, soaks    Effect of Pain on Daily Activities Sititng prolonged at work (computer work), housework ( limited in time)    Multiple Pain Sites Yes    Pain Score 7    Pain Location Shoulder    Pain Orientation Right    Pain Descriptors / Indicators Aching;Sore    Pain Type Chronic pain    Pain Onset More than a month ago    Pain  Frequency Intermittent    Aggravating Factors  more insidous soreness, resting at night/sitting    Pain Relieving Factors stretching              [facility] PT Assessment - 10/27/19 0001      Assessment   Medical Diagnosis Rt shoulder pain, low back/hip pain    Referring Provider (PT) Dr. Lorin Mercy    Onset Date/Surgical Date 09/07/19    Hand Dominance Right      Precautions   Precaution Comments L5, S1 fusion 2011      Balance Screen   Has the patient fallen in the past 6 months Yes    How many times? [place] residence    Home Access Stairs to enter    Entrance Stairs-Number of Steps 2      Prior Friendly Requirements Desk work/computer work    Leisure Walking, [employer]      Sensation   Light Touch Appears Intact      Posture/Postural Control   Posture Comments Reduced lumbar lordosis in standing  ROM / Strength   AROM / PROM / Strength AROM;PROM;Strength      AROM   Overall AROM Comments Minimal mobility noted in lumbar spine - movement based on thoracolumbar junction and above    AROM Assessment Site Lumbar;Hip;Shoulder    Right/Left Shoulder Left;Right    Right Shoulder Flexion 110 Degrees   pain noted   Right Shoulder ABduction 100 Degrees   pain noted   Lumbar Flexion to mid shin c ERP lumbar    Lumbar Extension 50 % WFL no change in symptoms, REIS x 3 no change    Lumbar - Right Side Bend to mid thigh end range tightness lumbar    Lumbar - Left Side Bend to mid thigh end range tightness lumbar      PROM   Overall PROM Comments Rt shoulder PROM similar to AROM, limited by pain     PROM Assessment Site Lumbar;Hip;Shoulder      Strength   Strength Assessment Site Shoulder;Elbow;Hip    Right/Left Shoulder Left;Right    Right Shoulder Flexion 4/5   pain   Right Shoulder ABduction 4+/5   pain   Right Shoulder Internal Rotation 5/5    Right Shoulder External Rotation 4+/5    Left Shoulder Flexion  5/5    Left Shoulder ABduction 5/5    Left Shoulder Internal Rotation 5/5    Left Shoulder External Rotation 5/5    Right/Left Elbow Left;Right    Right Elbow Flexion 5/5    Right Elbow Extension 5/5    Left Elbow Flexion 5/5    Left Elbow Extension 5/5    Right/Left Hip Left;Right      Palpation   Palpation comment Mild tenderness bilateral QL, lumbar paraspinals.      Special Tests   Other special tests (-) Slump bilateral, (-) empty can rt, drop arm Rt, painful arc Rt                      Objective measurements completed on examination: See above findings.       [facility] Adult PT Treatment/Exercise - 10/27/19 0001      Exercises   Exercises Other Exercises;Shoulder;Lumbar    Other Exercises  HEP instruction/performance c cues/handout.        Lumbar Exercises: Stretches   Single Knee to Chest Stretch 5 reps;Other (comment)   15 sec x 5 bilateral   Lower Trunk Rotation Other (comment)   15 sec x 5 bilat     Shoulder Exercises: Supine   Other Supine Exercises wand flexion 2 x 10                  PT Education - 10/27/19 1612    Education Details HEP, POC    Person(s) Educated Patient    Methods Explanation;Demonstration;Handout;Verbal cues    Comprehension Returned demonstration;Verbalized understanding            PT Short Term Goals - 10/27/19 1642      PT SHORT TERM GOAL #1   Title Patient will demonstrate independent use of home exercise program to maintain progress from in clinic treatments.    Time 2    Period Weeks    Status New    Target Date 11/10/19             PT Long Term Goals - 10/27/19 1642      PT LONG TERM GOAL #1   Title Patient will demonstrate/report pain at worst less than or equal  to 2/10 to facilitate minimal limitation in daily activity secondary to pain symptoms.    Time 8    Period Weeks    Status New    Target Date 12/22/19      PT LONG TERM GOAL #2   Title Patient will demonstrate independent use of  home exercise program to facilitate ability to maintain/progress functional gains from skilled physical therapy services.    Time 8    Period Weeks    Status New    Target Date 12/22/19      PT LONG TERM GOAL #3   Title Patient will demonstrate return to work/recreational activity at previous level of function without limitations secondary due to condition    Time 8    Period Weeks    Status New    Target Date 12/22/19      PT LONG TERM GOAL #4   Title Patient will demonstrate Rt [place] joint mobility WFL to facilitate usual self care, dressing, reaching overhead at PLOF s limitation due to symptoms.    Time 8    Period Weeks    Status New    Target Date 12/22/19      PT LONG TERM GOAL #5   Title Patient will demonstrate Rt UE MMT 5/5 throughout to facilitate usual lifting, carrying in functional activity to PLOF s limitation.    Time 8    Period Weeks    Status New    Target Date 12/22/19      Additional Long Term Goals   Additional Long Term Goals Yes      PT LONG TERM GOAL #6   Title Pt. will demonstrate lumbar ext > 75% WFL, lumbar flexion s symptoms to facilitate usual standing, walking, bending mobility at PLOF.    Time 8    Period Weeks    Status New    Target Date 12/22/19                  Plan - 10/27/19 1644    Clinical Impression Statement Patient is a 59 y.o. female who comes to clinic with complaints of low back pain, Rt shoulder pain with mobility, strength deficits that impair her ability to perform usual daily and recreational functional activities without increase difficulty/symptoms at this time.  Patient to benefit from skilled PT services to address impairments and limitations to improve to previous level of function without restriction secondary to condition.    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;Occupation;Other   housework   Stability/Clinical Decision Making Evolving/Moderate complexity    Clinical Decision Making Moderate    Rehab Potential --   fair to good   PT Frequency --   1-2 x/week   PT Duration 8 weeks    PT Treatment/Interventions ADLs/Self Care Home Management;Electrical Stimulation;Cryotherapy;Iontophoresis 4mg /ml Dexamethasone;Moist Heat;Balance training;Therapeutic exercise;Therapeutic activities;Functional mobility training;Stair training;Gait training;Ultrasound;Neuromuscular re-education;Patient/family education;Manual techniques;Taping;Dry needling;Passive range of motion;Spinal Manipulations;Joint Manipulations    PT Next Visit Plan Graded mobility improvements, Rt shoulder mobility/strength gains, review HEP    PT Home Exercise Plan [email or web address]    Consulted and Agree with Plan of Care Patient           Patient will benefit from skilled therapeutic intervention in order to improve the following deficits and impairments:  Decreased endurance, Hypomobility, Decreased activity tolerance, Decreased strength, Impaired UE functional use, Pain, Increased muscle spasms, Difficulty  walking, Decreased mobility, Decreased range of motion, Impaired perceived functional ability, Improper body mechanics, Postural dysfunction, Impaired flexibility  Visit Diagnosis: Chronic bilateral low back pain without sciatica  Chronic right shoulder pain  Muscle weakness (generalized)  Difficulty in walking, not elsewhere classified  Abnormal posture     Problem List Patient Active Problem List   Diagnosis Date Noted  . History of lumbar fusion 10/06/2019  . Fall 10/06/2019  . Contusion of right shoulder 10/06/2019  . Chest pain 03/19/2019  . HTN (hypertension) 03/19/2019  . HLD (hyperlipidemia) 03/19/2019  . Type 2 diabetes mellitus ([place])  03/19/2019  . Pre-diabetes 11/12/2017    Scot Jun, PT, DPT, OCS, ATC 10/27/19  4:48 PM  Note addended to correct visit number to 1  Scot Jun, PT, DPT, OCS, ATC 11/24/19  2:27 PM      [facility] [address] [place], [place], [address] Phone: [phone]   Fax:  [phone]  Name: SAMANTHAJO PAYANO MRN: [id number] Date of Birth: [birth year]

## 2019-10-27 NOTE — Patient Instructions (Signed)
Access Code: [email or web address] URL: [email or web address] Date: 10/27/2019 Prepared by: Scot Jun  Exercises Supine Lower Trunk Rotation - 2 x daily - 7 x weekly - 5 reps - 1 sets - 15 hold Hooklying Single Knee to Chest Stretch - 2 x daily - 7 x weekly - 5 reps - 1 sets - 15 hold Supine Shoulder Flexion Extension AAROM with [place] - 2 x daily - 7 x weekly - 2 sets - 10 reps - 3-5 hold

## 2019-11-03 ENCOUNTER — Ambulatory Visit (INDEPENDENT_AMBULATORY_CARE_PROVIDER_SITE_OTHER): Payer: BC Managed Care – PPO | Admitting: Orthopaedic Surgery

## 2019-11-03 ENCOUNTER — Encounter: Payer: Self-pay | Admitting: Orthopaedic Surgery

## 2019-11-03 VITALS — Ht 60.0 in | Wt 227.0 lb

## 2019-11-03 DIAGNOSIS — S300XXD Contusion of lower back and pelvis, subsequent encounter: Secondary | ICD-10-CM

## 2019-11-03 DIAGNOSIS — Z981 Arthrodesis status: Secondary | ICD-10-CM

## 2019-11-03 DIAGNOSIS — Z6841 Body Mass Index (BMI) 40.0 and over, adult: Secondary | ICD-10-CM

## 2019-11-03 MED ORDER — IBUPROFEN 800 MG PO TABS: 800.0000 mg | ORAL_TABLET | Freq: Two times a day (BID) | ORAL | 2 refills | Status: DC | PRN

## 2019-11-03 NOTE — Progress Notes (Signed)
Office Visit Note   Patient: Linda Olson           Date of Birth: 1960-08-04           MRN: [id number] Visit Date: 11/03/2019              Requested by: [name], [place],  [place] [address] PCP: [name], MD   Assessment & Plan: Visit Diagnoses:  1. Body mass index 40.0-44.9, adult ([place])   2. History of lumbar fusion   3. Lumbar contusion, subsequent encounter     Plan: Patient will continue her physical therapy.  We reviewed previous x-ray shows her fusion is solid.  I plan to recheck her in 6 weeks.  We will send in some ibuprofen 800 mg p.o. twice daily with meals which she requested.  Follow-Up Instructions: Return in about 6 weeks (around 12/15/2019).   Orders:  No orders of the defined types were placed in this encounter.  Meds ordered this encounter  Medications  . ibuprofen (ADVIL) 800 MG tablet    Sig: Take 1 tablet (800 mg total) by mouth every 12 (twelve) hours as needed.    Dispense:  60 tablet    Refill:  2      Procedures: No procedures performed   Clinical Data: No additional findings.   Subjective: Chief Complaint  Patient presents with  . Lower Back - Follow-up    Fall 09/07/2019  . Left Hip - Follow-up    Fall 09/07/2019  . Right Hip - Follow-up    Fall 09/07/2019    HPI 59 year old female returns she states she is only had 1 physical therapy session so far.  She had persistent symptoms since the fall on the slick floor when she was at a store and then went into the food court and the floor was wet.  Fall date was 09/07/2019.  She states she still having some pain between her shoulder blades and into her neck.  She is used heating pad, Salonpas, anti-inflammatories.  She states some days are better than others she is requesting ibuprofen since of the Voltaren medicine reacted with her.  She states the pain is worse at the end of the day.  L5-S1 fusion done by me 2011.  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  She has additional therapy visits scheduled.  Review of Systems 14 point system update unchanged from last office visit other than as mentioned above.  She does have prediabetes and also morbid obesity.   Objective: Vital Signs: Ht 5' (1.524 m)   Wt 227 lb (103 kg)   BMI 44.33 kg/m   Physical Exam  Ortho Exam  Specialty Comments:  No specialty comments available.  Imaging: No results found.   PMFS History: Patient Active Problem List   Diagnosis Date Noted  . Lumbar contusion 11/04/2019  . History of lumbar fusion 10/06/2019  . Fall 10/06/2019  . Contusion of right shoulder 10/06/2019  . Chest pain 03/19/2019  . HTN (hypertension) 03/19/2019  . HLD (hyperlipidemia) 03/19/2019  . Type 2 diabetes mellitus ([place]) 03/19/2019  . Pre-diabetes 11/12/2017   Past Medical History:  Diagnosis Date  . Hyperlipidemia     Family History  Problem Relation Age of Onset  . Breast cancer Neg Hx     Past Surgical History:  Procedure Laterality Date  . BACK SURGERY    . VAGINAL HYSTERECTOMY     Social History   Occupational  History  . Not on file  Tobacco Use  . Smoking status: Never Smoker  . Smokeless tobacco: Never Used  Substance and Sexual Activity  . Alcohol use: No  . Drug use: No  . Sexual activity: Not on file

## 2019-11-04 DIAGNOSIS — S300XXA Contusion of lower back and pelvis, initial encounter: Secondary | ICD-10-CM | POA: Insufficient documentation

## 2019-11-13 ENCOUNTER — Other Ambulatory Visit: Payer: Self-pay | Admitting: Critical Care Medicine

## 2019-11-13 ENCOUNTER — Other Ambulatory Visit: Payer: BC Managed Care – PPO

## 2019-11-13 DIAGNOSIS — Z20822 Contact with and (suspected) exposure to covid-19: Secondary | ICD-10-CM | POA: Diagnosis not present

## 2019-11-16 ENCOUNTER — Telehealth: Payer: Self-pay | Admitting: Family Medicine

## 2019-11-16 LAB — NOVEL CORONAVIRUS, NAA: SARS-CoV-2, NAA: NOT DETECTED

## 2019-11-16 NOTE — Telephone Encounter (Signed)
Patient is calling to receive her negative COVID results. Patient expressed understanding. °

## 2019-11-17 ENCOUNTER — Encounter: Payer: BC Managed Care – PPO | Admitting: Physical Therapy

## 2019-11-17 DIAGNOSIS — J329 Chronic sinusitis, unspecified: Secondary | ICD-10-CM | POA: Diagnosis not present

## 2019-11-19 ENCOUNTER — Encounter: Payer: BC Managed Care – PPO | Admitting: Physical Therapy

## 2019-11-24 ENCOUNTER — Ambulatory Visit (INDEPENDENT_AMBULATORY_CARE_PROVIDER_SITE_OTHER): Payer: BC Managed Care – PPO | Admitting: Rehabilitative and Restorative Service Providers"

## 2019-11-24 ENCOUNTER — Other Ambulatory Visit: Payer: Self-pay

## 2019-11-24 ENCOUNTER — Encounter: Payer: Self-pay | Admitting: Rehabilitative and Restorative Service Providers"

## 2019-11-24 DIAGNOSIS — M25511 Pain in right shoulder: Secondary | ICD-10-CM | POA: Diagnosis not present

## 2019-11-24 DIAGNOSIS — M545 Low back pain, unspecified: Secondary | ICD-10-CM

## 2019-11-24 DIAGNOSIS — M6281 Muscle weakness (generalized): Secondary | ICD-10-CM

## 2019-11-24 DIAGNOSIS — R293 Abnormal posture: Secondary | ICD-10-CM

## 2019-11-24 DIAGNOSIS — G8929 Other chronic pain: Secondary | ICD-10-CM

## 2019-11-24 DIAGNOSIS — R262 Difficulty in walking, not elsewhere classified: Secondary | ICD-10-CM | POA: Diagnosis not present

## 2019-11-24 NOTE — Therapy (Signed)
[place], [place], [address] Phone: [phone]   Fax:  [phone]  Physical Therapy Treatment  Patient Details  Name: Linda Olson MRN: [id number] Date of Birth: 1960-11-27 Referring Provider (PT): Dr. Lorin Mercy   Encounter Date: 11/24/2019   PT End of Session - 11/24/19 1603    Visit Number 2    Number of Visits 12    Date for PT Re-Evaluation 12/22/19    Progress Note Due on Visit 10    PT Start Time 1556    PT Stop Time 1635    PT Time Calculation (min) 39 min    Activity Tolerance Patient tolerated treatment well    Behavior During Therapy [facility] for tasks assessed/performed           Past Medical History:  Diagnosis Date  . Hyperlipidemia     Past Surgical History:  Procedure Laterality Date  . BACK SURGERY    . VAGINAL HYSTERECTOMY      There were no vitals filed for this visit.   Subjective Assessment - 11/24/19 1602    Subjective Pt. stated no pain upon arrival today.  Pt. stated having off and on pains since last visit.  Pt. stated she was sick and had aches from illness so not sure what was normal pains and ache stuff.    Pertinent History Fusion L5, S1    Diagnostic tests xrays back and shoulder    Patient Stated Goals Reduce pain    Currently in Pain? No/denies    Pain Score 0-No pain    Pain Onset More than a month ago    Pain Score 0    Pain Onset More than a month ago                             [facility] Adult PT Treatment/Exercise - 11/24/19 0001      Lumbar Exercises: Stretches   Single Knee to Chest Stretch Left;Right;Other (comment)   15 sec   Lower Trunk Rotation Other (comment)   15 sec x 5 bilateral     Lumbar Exercises: Aerobic   Nustep Lvl 5 10 mins      Shoulder Exercises: Standing   External Rotation Both;20 reps;Theraband   green   Extension Both   3 x 10   Theraband Level (Shoulder Extension) Level 3 (Green)    Row Both;Theraband   3 x 10    Theraband Level (Shoulder Row) Level 3 Nyoka Cowden)                    PT Short Term Goals - 11/24/19 1603      PT SHORT TERM GOAL #1   Title Patient will demonstrate independent use of home exercise program to maintain progress from in clinic treatments.    Baseline 11/24/2019: adjusted goal time due to only 2nd visit due to illness    Time 2    Period Weeks    Status Revised    Target Date 12/08/19             PT Long Term Goals - 10/27/19 1642      PT LONG TERM GOAL #1   Title Patient will demonstrate/report pain at worst less than or equal to 2/10 to facilitate minimal limitation in daily activity secondary to pain symptoms.    Time 8    Period Weeks    Status New    Target  Date 12/22/19      PT LONG TERM GOAL #2   Title Patient will demonstrate independent use of home exercise program to facilitate ability to maintain/progress functional gains from skilled physical therapy services.    Time 8    Period Weeks    Status New    Target Date 12/22/19      PT LONG TERM GOAL #3   Title Patient will demonstrate return to work/recreational activity at previous level of function without limitations secondary due to condition    Time 8    Period Weeks    Status New    Target Date 12/22/19      PT LONG TERM GOAL #4   Title Patient will demonstrate Rt [place] joint mobility WFL to facilitate usual self care, dressing, reaching overhead at PLOF s limitation due to symptoms.    Time 8    Period Weeks    Status New    Target Date 12/22/19      PT LONG TERM GOAL #5   Title Patient will demonstrate Rt UE MMT 5/5 throughout to facilitate usual lifting, carrying in functional activity to PLOF s limitation.    Time 8    Period Weeks    Status New    Target Date 12/22/19      Additional Long Term Goals   Additional Long Term Goals Yes      PT LONG TERM GOAL #6   Title Pt. will demonstrate lumbar ext > 75% WFL, lumbar flexion s symptoms to facilitate usual standing, walking,  bending mobility at PLOF.    Time 8    Period Weeks    Status New    Target Date 12/22/19                 Plan - 11/24/19 1622    Clinical Impression Statement General fatigue and ache residual following recent illness overall, impactiing activity tolerance.  Pt. may continue to benefit from skilled PT program to improve mobility for lumbar/shoulder and progress endurance and actiivty tolerance to recover to reach LTGs.    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;Occupation;Other   housework   Stability/Clinical Decision Making Evolving/Moderate complexity    Rehab Potential --   fair to good   PT Frequency --   1-2 x/week   PT Duration 8 weeks    PT Treatment/Interventions ADLs/Self Care Home Management;Electrical Stimulation;Cryotherapy;Iontophoresis 4mg /ml Dexamethasone;Moist Heat;Balance training;Therapeutic exercise;Therapeutic activities;Functional mobility training;Stair training;Gait training;Ultrasound;Neuromuscular re-education;Patient/family education;Manual techniques;Taping;Dry needling;Passive range of motion;Spinal Manipulations;Joint Manipulations    PT Next Visit Plan Graded mobility improvements, Rt shoulder mobility/strength gains    PT Home Exercise Plan [email or web address]    Consulted and Agree with Plan of Care Patient           Patient will benefit from skilled therapeutic intervention in order to improve the following deficits and impairments:  Decreased endurance, Hypomobility, Decreased activity tolerance, Decreased strength, Impaired UE functional use, Pain, Increased muscle spasms, Difficulty walking, Decreased mobility, Decreased range of motion, Impaired perceived functional ability, Improper body mechanics, Postural dysfunction, Impaired  flexibility  Visit Diagnosis: Chronic bilateral low back pain without sciatica  Chronic right shoulder pain  Muscle weakness (generalized)  Difficulty in walking, not elsewhere classified  Abnormal posture     Problem List Patient Active Problem List   Diagnosis Date Noted  . Lumbar contusion 11/04/2019  . History  of lumbar fusion 10/06/2019  . Fall 10/06/2019  . Contusion of right shoulder 10/06/2019  . Chest pain 03/19/2019  . HTN (hypertension) 03/19/2019  . HLD (hyperlipidemia) 03/19/2019  . Type 2 diabetes mellitus ([place]) 03/19/2019  . Pre-diabetes 11/12/2017    Scot Jun, PT, DPT, OCS, ATC 11/24/19  4:24 PM    [facility] [address] [place], [place], [address] Phone: [phone]   Fax:  [phone]  Name: Linda Olson MRN: [id number] Date of Birth: 1960-05-01

## 2019-11-26 ENCOUNTER — Encounter: Payer: Self-pay | Admitting: Rehabilitative and Restorative Service Providers"

## 2019-11-26 ENCOUNTER — Ambulatory Visit (INDEPENDENT_AMBULATORY_CARE_PROVIDER_SITE_OTHER): Payer: BC Managed Care – PPO | Admitting: Rehabilitative and Restorative Service Providers"

## 2019-11-26 ENCOUNTER — Other Ambulatory Visit: Payer: Self-pay

## 2019-11-26 DIAGNOSIS — M6281 Muscle weakness (generalized): Secondary | ICD-10-CM | POA: Diagnosis not present

## 2019-11-26 DIAGNOSIS — M25511 Pain in right shoulder: Secondary | ICD-10-CM | POA: Diagnosis not present

## 2019-11-26 DIAGNOSIS — R262 Difficulty in walking, not elsewhere classified: Secondary | ICD-10-CM

## 2019-11-26 DIAGNOSIS — G8929 Other chronic pain: Secondary | ICD-10-CM

## 2019-11-26 DIAGNOSIS — M545 Low back pain: Secondary | ICD-10-CM | POA: Diagnosis not present

## 2019-11-26 DIAGNOSIS — R293 Abnormal posture: Secondary | ICD-10-CM

## 2019-11-26 NOTE — Therapy (Addendum)
[place], [place], [address] Phone: [phone]   Fax:  [phone]  Physical Therapy Treatment/Discharge  Patient Details  Name: Linda Olson MRN: [id number] Date of Birth: August 13, 1960 Referring Provider (PT): Dr. Lorin Mercy   Encounter Date: 11/26/2019   PT End of Session - 11/26/19 1604    Visit Number 3    Number of Visits 12    Date for PT Re-Evaluation 12/22/19    Progress Note Due on Visit 10    PT Start Time 1555    PT Stop Time 1625    PT Time Calculation (min) 30 min    Activity Tolerance Patient tolerated treatment well    Behavior During Therapy [facility] for tasks assessed/performed           Past Medical History:  Diagnosis Date  . Hyperlipidemia     Past Surgical History:  Procedure Laterality Date  . BACK SURGERY    . VAGINAL HYSTERECTOMY      There were no vitals filed for this visit.   Subjective Assessment - 11/26/19 1559    Subjective Pt. indicated feeling better today than she did the last time.  No pain to report today.  Pt. indicated having to cancel upcoming visits due to work schedule.    Pertinent History Fusion L5, S1    Diagnostic tests xrays back and shoulder    Patient Stated Goals Reduce pain    Currently in Pain? No/denies    Pain Score 0-No pain    Pain Onset More than a month ago    Multiple Pain Sites No    Pain Onset More than a month ago                             [facility] Adult PT Treatment/Exercise - 11/26/19 0001      Lumbar Exercises: Stretches   Single Knee to Chest Stretch Left;Right;Other (comment)   15 sec x 5 bilateral   Lower Trunk Rotation Other (comment)   15 sec x 5 bilateral     Lumbar Exercises: Aerobic   Nustep Lvl 5 12 mins      Lumbar Exercises: Supine   Other Supine Lumbar Exercises supine dying bug x 15 bilateral       Shoulder Exercises: Standing   External Rotation Both;Strengthening;20 reps    Extension 20  reps;Left;Strengthening    Theraband Level (Shoulder Extension) Level 3 (Green)    Row Both;Strengthening;20 reps    Theraband Level (Shoulder Row) Level 3 Nyoka Cowden)                    PT Short Term Goals - 11/24/19 1603      PT SHORT TERM GOAL #1   Title Patient will demonstrate independent use of home exercise program to maintain progress from in clinic treatments.    Baseline 11/24/2019: adjusted goal time due to only 2nd visit due to illness    Time 2    Period Weeks    Status Revised    Target Date 12/08/19             PT Long Term Goals - 11/26/19 1600      PT LONG TERM GOAL #1   Title Patient will demonstrate/report pain at worst less than or equal to 2/10 to facilitate minimal limitation in daily activity secondary to pain symptoms.    Time 8    Period Weeks  Status On-going    Target Date 12/22/19      PT LONG TERM GOAL #2   Title Patient will demonstrate independent use of home exercise program to facilitate ability to maintain/progress functional gains from skilled physical therapy services.    Time 8    Period Weeks    Status Achieved    Target Date 12/22/19      PT LONG TERM GOAL #3   Title Patient will demonstrate return to work/recreational activity at previous level of function without limitations secondary due to condition    Time 8    Period Weeks    Status On-going    Target Date 12/22/19      PT LONG TERM GOAL #4   Title Patient will demonstrate Rt [place] joint mobility WFL to facilitate usual self care, dressing, reaching overhead at PLOF s limitation due to symptoms.    Time 8    Period Weeks    Status On-going    Target Date 12/22/19      PT LONG TERM GOAL #5   Title Patient will demonstrate Rt UE MMT 5/5 throughout to facilitate usual lifting, carrying in functional activity to PLOF s limitation.    Time 8    Period Weeks    Status On-going    Target Date 12/22/19      PT LONG TERM GOAL #6   Title Pt. will demonstrate lumbar ext  > 75% WFL, lumbar flexion s symptoms to facilitate usual standing, walking, bending mobility at PLOF.    Time 8    Period Weeks    Status On-going    Target Date 12/22/19                 Plan - 11/26/19 1602    Clinical Impression Statement Improvement in symptoms noted and reduced genral fatigue reported since last visit.  Acceptable HEP knowledge noted from review today.  Due to schedule conflicts, Pt. unable to return for several weeks and will plan to continue c HEP for gains.    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;Occupation;Other   housework   Stability/Clinical Decision Making Evolving/Moderate complexity    Rehab Potential --   fair to good   PT Frequency --   1-2 x/week   PT Duration 8 weeks    PT Treatment/Interventions ADLs/Self Care Home Management;Electrical Stimulation;Cryotherapy;Iontophoresis 4mg /ml Dexamethasone;Moist Heat;Balance training;Therapeutic exercise;Therapeutic activities;Functional mobility training;Stair training;Gait training;Ultrasound;Neuromuscular re-education;Patient/family education;Manual techniques;Taping;Dry needling;Passive range of motion;Spinal Manipulations;Joint Manipulations    PT Next Visit Plan Continued HEP use, graded mobility, strength gains.    PT Home Exercise Plan [id number]    Consulted and Agree with Plan of Care Patient           Patient will benefit from skilled therapeutic intervention in order to improve the following deficits and impairments:  Decreased endurance, Hypomobility, Decreased activity tolerance, Decreased strength, Impaired UE functional use, Pain, Increased muscle spasms, Difficulty walking, Decreased mobility, Decreased range of motion, Impaired perceived functional ability, Improper body  mechanics, Postural dysfunction, Impaired flexibility  Visit Diagnosis: Chronic bilateral low back pain without sciatica  Chronic right shoulder pain  Muscle weakness (generalized)  Difficulty in walking, not elsewhere classified  Abnormal posture     Problem List Patient Active Problem List   Diagnosis Date Noted  . Lumbar contusion 11/04/2019  . History of lumbar fusion 10/06/2019  . Fall  10/06/2019  . Contusion of right shoulder 10/06/2019  . Chest pain 03/19/2019  . HTN (hypertension) 03/19/2019  . HLD (hyperlipidemia) 03/19/2019  . Type 2 diabetes mellitus ([place]) 03/19/2019  . Pre-diabetes 11/12/2017    Scot Jun, PT, DPT, OCS, ATC 11/26/19  4:18 PM  PHYSICAL THERAPY DISCHARGE SUMMARY  Visits from Start of Care: 3  Current functional level related to goals / functional outcomes: See note   Remaining deficits: See note   Education / Equipment: HEP Plan: Patient agrees to discharge.  Patient goals were partially met. Patient is being discharged due to not returning since the last visit.  ?????     Scot Jun, PT, DPT, OCS, ATC 02/10/20  1:52 PM     [facility] [address] [place], [place], [address] Phone: [phone]   Fax:  [phone]  Name: Linda Olson MRN: [id number] Date of Birth: 12/19/1960

## 2019-12-01 ENCOUNTER — Encounter: Payer: BC Managed Care – PPO | Admitting: Rehabilitative and Restorative Service Providers"

## 2019-12-03 ENCOUNTER — Encounter: Payer: BC Managed Care – PPO | Admitting: Rehabilitative and Restorative Service Providers"

## 2019-12-14 DIAGNOSIS — E119 Type 2 diabetes mellitus without complications: Secondary | ICD-10-CM | POA: Diagnosis not present

## 2019-12-15 ENCOUNTER — Encounter: Payer: Self-pay | Admitting: Orthopaedic Surgery

## 2019-12-15 ENCOUNTER — Ambulatory Visit (INDEPENDENT_AMBULATORY_CARE_PROVIDER_SITE_OTHER): Payer: BC Managed Care – PPO | Admitting: Orthopaedic Surgery

## 2019-12-15 VITALS — BP 106/72 | HR 81 | Ht 60.0 in | Wt 232.0 lb

## 2019-12-15 DIAGNOSIS — M545 Low back pain, unspecified: Secondary | ICD-10-CM | POA: Diagnosis not present

## 2019-12-15 DIAGNOSIS — Z981 Arthrodesis status: Secondary | ICD-10-CM | POA: Diagnosis not present

## 2019-12-15 NOTE — Progress Notes (Signed)
Office Visit Note   Patient: Linda Olson           Date of Birth: 1961/01/26           MRN: [id number] Visit Date: 12/15/2019              Requested by: [name], [place],  [place] [id number] PCP: [name], MD   Assessment & Plan: Visit Diagnoses:  1. History of lumbar fusion   2. Low back pain, unspecified back pain laterality, unspecified chronicity, unspecified whether sciatica present     Plan: Patient had fusion 10 years ago now is getting some progression has degenerative changes at L4-5 and likely has some significant stenosis above her solid fusion.  Follow-Up Instructions: No follow-ups on file.   Orders:  Orders Placed This Encounter  Procedures  . MR Lumbar Spine w/o contrast   No orders of the defined types were placed in this encounter.     Procedures: No procedures performed   Clinical Data: No additional findings.   Subjective: Chief Complaint  Patient presents with  . Lower Back - Pain, Follow-up    HPI 59 year old female 6-week follow-up for ongoing problems with back pain and hip pain.  She has been through physical therapy and has been using ibuprofen.  She states she woke up with pain in her lower back some mid back pain which is worse by the time she gets ready go to sleep.  She thinks therapy may have helped some more with her shoulders and her lower back.  She has more discomfort when she is bending forward.  Some days are better than others.  She has had 4 visits so far with therapy.  L5-S1 fusion done 2011.  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  She does have prediabetes as well as morbid obesity with BMI of 45.  Patient's pain is been significantly increased since her fall 09/07/2019.  No acute fracture with fall but significant increase back and buttocks and leg pain left more than right since the fall.  Review of Systems all other systems are noncontributory to  HPI.   Objective: Vital Signs: BP 106/72   Pulse 81   Ht 5' (1.524 m)   Wt 232 lb (105.2 kg)   BMI 45.31 kg/m   Physical Exam Constitutional:      Appearance: She is well-developed.  HENT:     Head: Normocephalic.     Right Ear: External ear normal.     Left Ear: External ear normal.  Eyes:     Pupils: Pupils are equal, round, and reactive to light.  Neck:     Thyroid: No thyromegaly.     Trachea: No tracheal deviation.  Cardiovascular:     Rate and Rhythm: Normal rate.  Pulmonary:     Effort: Pulmonary effort is normal.  Abdominal:     Palpations: Abdomen is soft.  Skin:    General: Skin is warm and dry.  Neurological:     Mental Status: She is alert and oriented to person, place, and time.  Psychiatric:        Behavior: Behavior normal.     Ortho Exam patient has well-healed lumbar incision.  She is Dealer.  Tenderness over the paralumbar muscles positive sciatic notch tenderness.  She is able to heel and toe walk.  Increased pain with forward bending. Specialty Comments:  No specialty comments available.  Imaging: No results found.  PMFS History: Patient Active Problem List   Diagnosis Date Noted  . Lumbar contusion 11/04/2019  . History of lumbar fusion 10/06/2019  . Fall 10/06/2019  . Contusion of right shoulder 10/06/2019  . Chest pain 03/19/2019  . HTN (hypertension) 03/19/2019  . HLD (hyperlipidemia) 03/19/2019  . Type 2 diabetes mellitus ([place]) 03/19/2019  . Pre-diabetes 11/12/2017   Past Medical History:  Diagnosis Date  . Hyperlipidemia     Family History  Problem Relation Age of Onset  . Breast cancer Neg Hx     Past Surgical History:  Procedure Laterality Date  . BACK SURGERY    . VAGINAL HYSTERECTOMY     Social History   Occupational History  . Not on file  Tobacco Use  . Smoking status: Never Smoker  . Smokeless tobacco: Never Used  Substance and Sexual Activity  . Alcohol use: No  . Drug use: No  . Sexual activity: Not  on file

## 2020-01-03 ENCOUNTER — Other Ambulatory Visit: Payer: BC Managed Care – PPO

## 2020-01-09 ENCOUNTER — Ambulatory Visit
Admission: RE | Admit: 2020-01-09 | Discharge: 2020-01-09 | Disposition: A | Discharge status: Home / Self Care | Payer: BC Managed Care – PPO | Source: Ambulatory Visit | Attending: Orthopaedic Surgery | Admitting: Orthopaedic Surgery

## 2020-01-09 ENCOUNTER — Other Ambulatory Visit: Payer: BC Managed Care – PPO

## 2020-01-09 DIAGNOSIS — M545 Low back pain, unspecified: Secondary | ICD-10-CM | POA: Diagnosis not present

## 2020-01-09 DIAGNOSIS — Z981 Arthrodesis status: Secondary | ICD-10-CM

## 2020-01-14 ENCOUNTER — Telehealth: Payer: Self-pay

## 2020-01-26 ENCOUNTER — Other Ambulatory Visit: Payer: Self-pay

## 2020-01-26 ENCOUNTER — Encounter: Payer: Self-pay | Admitting: Orthopaedic Surgery

## 2020-01-26 ENCOUNTER — Ambulatory Visit (INDEPENDENT_AMBULATORY_CARE_PROVIDER_SITE_OTHER): Payer: BC Managed Care – PPO | Admitting: Orthopaedic Surgery

## 2020-01-26 ENCOUNTER — Telehealth: Payer: Self-pay | Admitting: Physical Medicine and Rehabilitation

## 2020-01-26 VITALS — Ht 60.0 in | Wt 232.0 lb

## 2020-01-26 DIAGNOSIS — M545 Low back pain, unspecified: Secondary | ICD-10-CM

## 2020-01-26 DIAGNOSIS — Z981 Arthrodesis status: Secondary | ICD-10-CM | POA: Diagnosis not present

## 2020-01-26 NOTE — Telephone Encounter (Signed)
Called pt and resch.

## 2020-01-26 NOTE — Telephone Encounter (Signed)
Pt would like to R/S her appt set for 02/22/20  [phone]

## 2020-01-26 NOTE — Progress Notes (Signed)
Office Visit Note   Patient: Linda Olson           Date of Birth: [birth year]           MRN: [id number] Visit Date: 01/26/2020              Requested by: [name], [place],  [place] [id number] PCP: [name], MD   Assessment & Plan: Visit Diagnoses:  1. Low back pain, unspecified back pain laterality, unspecified chronicity, unspecified whether sciatica present   2. History of lumbar fusion     Plan: Patient did have some nonspecific edema in the right pelvis noted on MRI scan.  She did have a fall 09/07/2019.  Pain after fall was on the left side.  Patient does have tiny central protrusion at L4-5 with some mild neuroforaminal narrowing mild disc fusion L3-4.  We will set up for single epidural injection with Dr. Ernestina Patches referral to Dr. Leafy Ro to aid in weight loss.  I will check her back after the epidural.  Follow-Up Instructions: No follow-ups on file.   Orders:  Orders Placed This Encounter  Procedures  . Amb Ref to Medical Weight Management  . Ambulatory referral to Physical Medicine Rehab   No orders of the defined types were placed in this encounter.     Procedures: No procedures performed   Clinical Data: No additional findings.   Subjective: Chief Complaint  Patient presents with  . Lower Back - Pain, Follow-up    MRI lumbar review    HPI 59 year old female seen with low back pain bilateral leg pain present x4 months.  She states she is unable to rest she has worse pain at the end of the evening and at night.  She has pain after sitting at work she gets up and walks around with some improvement.  She takes ibuprofen 800 mg p.o. twice daily.  Review of Systems plus for type 2 diabetes hypertension hyperlipidemia lumbar fusion morbid obesity BMI 45.   Objective: Vital Signs: Ht 5' (1.524 m)   Wt 232 lb (105.2 kg)   BMI 45.31 kg/m   Physical Exam Constitutional:      Appearance: She is well-developed.  HENT:      Head: Normocephalic.     Right Ear: External ear normal.     Left Ear: External ear normal.  Eyes:     Pupils: Pupils are equal, round, and reactive to light.  Neck:     Thyroid: No thyromegaly.     Trachea: No tracheal deviation.  Cardiovascular:     Rate and Rhythm: Normal rate.  Pulmonary:     Effort: Pulmonary effort is normal.  Abdominal:     Palpations: Abdomen is soft.  Skin:    General: Skin is warm and dry.  Neurological:     Mental Status: She is alert and oriented to person, place, and time.  Psychiatric:        Behavior: Behavior normal.     Ortho Exam patient has neck straight leg raising 90 degrees negative logroll the hips knee and ankle jerk are intact.  Well-healed lumbar incision.  Negative Homan.  She is able to heel and toe walk.  Specialty Comments:  No specialty comments available.  Imaging: CLINICAL DATA:  History of lumbar fusion. Low back pain, unspecified back pain laterality, unspecified chronicity, unspecified whether sciatica present. Low back pain, prior surgery, new symptoms; history of L5-S1 fusion, fall 09/07/2019 with low back and left  buttock pain. Additional history provided by scanning technologist: Patient reports 4 months of low back pain a central and radiates into both legs, weakness, previous lumbar fusion 2011.  EXAM: MRI LUMBAR SPINE WITHOUT CONTRAST  TECHNIQUE: Multiplanar, multisequence MR imaging of the lumbar spine was performed. No intravenous contrast was administered.  COMPARISON:  Lumbar spine radiographs 10/06/2019. Lumbar spine MRI 02/09/2010.  FINDINGS: Segmentation: 5 lumbar vertebrae (correlating with prior lumbar spine radiographs).  Alignment:  Mild L5-S1 fused grade 1 anterolisthesis.  Vertebrae: Prior L5-S1 posterior decompression and posterior spinal fusion. Susceptibility artifact arising from the spinal fusion construct limits evaluation of marrow signal at this level. Vertebral body height  is maintained. Partially imaged nonspecific edema within the right iliac bone (series 4, image 1). No significant marrow edema or suspicious osseous lesion identified elsewhere.  Conus medullaris and cauda equina: Conus extends to the L1 level. No signal abnormality within the visualized distal spinal cord.  Paraspinal and other soft tissues: Incompletely assessed left renal cysts. Atrophy of the lumbar paraspinal musculature. Postsurgical changes to the dorsal soft tissues overlying the lower lumbar spine.  Disc levels:  Unless otherwise stated, the level by level findings below have not significantly changed since prior MRI 02/10/2010.  No more than mild disc degeneration at the non fused levels.  T12-L1: Small disc bulge. Mild facet arthrosis. No significant spinal canal or foraminal narrowing.  L1-L2: Mild facet arthrosis. No significant disc herniation or stenosis.  L2-L3: Minimal disc bulge. Mild facet arthrosis/ligamentum flavum hypertrophy. Mild left subarticular narrowing without nerve root impingement. Central canal patent. Mild bilateral neural foraminal narrowing.  L3-L4: Disc bulge. Superimposed shallow broad-based central disc protrusion. Moderate facet arthrosis. Mild bilateral subarticular narrowing with slight crowding of the descending L4 nerve roots (series 6, image 20). Mild relative narrowing of the central canal. Mild bilateral neural foraminal narrowing.  L4-L5: Post laminectomy changes, new from the prior examination. Tiny left center disc protrusion, new from the prior examination (series 6, image 26). Posterior element hypertrophy. No significant spinal canal stenosis. Mild right neural foraminal narrowing.  L5-S1: Interval posterior decompression and posterior spinal fusion. Small fluid collection within the laminectomy bed posterior to the thecal sac, measuring 1.5 x 0.4 cm in transaxial dimensions and likely reflecting a seroma  (series 6, image 29). Posterior element hypertrophy. No significant spinal canal stenosis. Susceptibility artifact limits evaluation of the neural foramina. No appreciable left neural foraminal narrowing. Suspected mild right neural foraminal narrowing related to endplate spurring and posterior element hypertrophy.  IMPRESSION: Comparison is made with the prior lumbar spine MRI of 02/10/2010. Lumbar spondylosis and postsurgical changes as described. Findings are most notably as follows.  At L5-S1, there has been interval posterior decompression and posterior spinal fusion. No significant spinal canal stenosis remains. Susceptibility artifact limits evaluation of the neural foramina. Suspected mild right neural foraminal narrowing secondary to endplate spurring and posterior element hypertrophy.  At L4-L5, post laminectomy changes are new from the prior MRI. There is a new tiny left center disc protrusion without spinal canal stenosis. Posterior element hypertrophy contributes to mild right neural foraminal narrowing.  At L3-L4, there is a disc bulge. Superimposed shallow broad-based central disc protrusion. Moderate facet arthrosis. Mild bilateral subarticular narrowing with slight crowding of the descending L4 nerve roots. Mild relative narrowing of the central canal. Mild bilateral neural foraminal narrowing. These findings are unchanged.  Partially imaged nonspecific edema within the right iliac bone.   Electronically Signed   By: Kellie Simmering DO   On:  01/10/2020 08:15    PMFS History: Patient Active Problem List   Diagnosis Date Noted  . Lumbar contusion 11/04/2019  . History of lumbar fusion 10/06/2019  . Fall 10/06/2019  . Contusion of right shoulder 10/06/2019  . Chest pain 03/19/2019  . HTN (hypertension) 03/19/2019  . HLD (hyperlipidemia) 03/19/2019  . Type 2 diabetes mellitus ([place]) 03/19/2019  . Pre-diabetes 11/12/2017   Past Medical History:   Diagnosis Date  . Hyperlipidemia     Family History  Problem Relation Age of Onset  . Breast cancer Neg Hx     Past Surgical History:  Procedure Laterality Date  . BACK SURGERY    . VAGINAL HYSTERECTOMY     Social History   Occupational History  . Not on file  Tobacco Use  . Smoking status: Never Smoker  . Smokeless tobacco: Never Used  Substance and Sexual Activity  . Alcohol use: No  . Drug use: No  . Sexual activity: Not on file

## 2020-01-30 ENCOUNTER — Other Ambulatory Visit: Payer: Self-pay | Admitting: Orthopaedic Surgery

## 2020-02-01 NOTE — Telephone Encounter (Signed)
Please advise 

## 2020-02-09 DIAGNOSIS — Z03818 Encounter for observation for suspected exposure to other biological agents ruled out: Secondary | ICD-10-CM | POA: Diagnosis not present

## 2020-02-09 DIAGNOSIS — R059 Cough, unspecified: Secondary | ICD-10-CM | POA: Diagnosis not present

## 2020-02-09 DIAGNOSIS — G510 Bell's palsy: Secondary | ICD-10-CM | POA: Diagnosis not present

## 2020-02-09 DIAGNOSIS — J029 Acute pharyngitis, unspecified: Secondary | ICD-10-CM | POA: Diagnosis not present

## 2020-02-10 ENCOUNTER — Encounter: Payer: Self-pay | Admitting: *Deleted

## 2020-02-10 NOTE — Progress Notes (Signed)
Per notes from [employer].

## 2020-02-11 ENCOUNTER — Encounter: Payer: Self-pay | Admitting: Neurology

## 2020-02-11 ENCOUNTER — Ambulatory Visit (INDEPENDENT_AMBULATORY_CARE_PROVIDER_SITE_OTHER): Payer: BC Managed Care – PPO | Admitting: Neurology

## 2020-02-11 ENCOUNTER — Other Ambulatory Visit: Payer: Self-pay

## 2020-02-11 VITALS — BP 139/86 | HR 87 | Ht 60.0 in | Wt 226.0 lb

## 2020-02-11 DIAGNOSIS — G519 Disorder of facial nerve, unspecified: Secondary | ICD-10-CM

## 2020-02-11 DIAGNOSIS — R29810 Facial weakness: Secondary | ICD-10-CM

## 2020-02-11 DIAGNOSIS — G510 Bell's palsy: Secondary | ICD-10-CM

## 2020-02-11 NOTE — Progress Notes (Signed)
[facility]    Provider:  Dr Jaynee Eagles Requesting Provider: [name], MD Primary Care Provider:  [name], MD  CC:  Bells palsy  HPI:  Linda Olson is a 59 y.o. female here as requested by [name], MD for Bells palsy. Started 2 days ago. She has already been started on Valtrex. She was at work 2 days ago and the right eye kept draining, one of her workers noticed something on her face or her face didn't look right, at the end of the day she felt her speech was affected. Her smile is affected. No numbness. Started 2 days ago. Not worsening. Difficult to talk. She is using lubrication in the eye, wear glasses, she is using gel lubrication at night and has an eye patch. No numbness. Affecting her hearing sounds loud. She has not bitten her tongue. Prior ti this a few months she had an illness in her throat and chest and Monday night she felt a little of the same.   Reviewed notes, labs and imaging from outside physicians, which showed:  CT Head 03/30/2013:   Ct showed No acute intracranial abnormalities including mass lesion or mass effect, hydrocephalus, extra-axial fluid collection, midline shift, hemorrhage, or acute infarction, large ischemic events (personally reviewed images)     Review of Systems: Patient complains of symptoms per HPI as well as the following symptoms: facial weakness. Pertinent negatives and positives per HPI. All others negative.   Social History   Socioeconomic History  . Marital status: Single    Spouse name: Not on file  . Number of children: Not on file  . Years of education: Not on file  . Highest education level: Not on file  Occupational History  . Not on file  Tobacco Use  . Smoking status: Never Smoker  . Smokeless tobacco: Never Used  Substance and Sexual Activity  . Alcohol use: No  . Drug use: No  . Sexual activity: Not on file  Other Topics Concern  . Not on file  Social History Narrative   Lives at  home with sister   Caffeine max of 1 cup in a day but not daily    Social Determinants of Health   Financial Resource Strain: Not on file  Food Insecurity: Not on file  Transportation Needs: Not on file  Physical Activity: Not on file  Stress: Not on file  Social Connections: Not on file  Intimate Partner Violence: Not on file    Family History  Problem Relation Age of Onset  . High blood pressure Sister   . Stroke Maternal Aunt   . Breast cancer Neg Hx     Past Medical History:  Diagnosis Date  . Hyperlipidemia   . Low vitamin D level    per notes from [employer]  . Obesity    per notes from [facility]  . Orbital floor fracture [facility])    [facility]; per notes from [facility]  . Prediabetes    per notes from [facility]  . Preeclampsia    per notes from [place]    Patient Active Problem List   Diagnosis Date Noted  . Bell's palsy 02/11/2020  . Lumbar contusion 11/04/2019  . History of lumbar fusion 10/06/2019  . Fall 10/06/2019  . Contusion of right shoulder 10/06/2019  . Chest pain 03/19/2019  . HTN (hypertension) 03/19/2019  . HLD (hyperlipidemia) 03/19/2019  . Type 2 diabetes mellitus ([place]) 03/19/2019  . Pre-diabetes 11/12/2017  Past Surgical History:  Procedure Laterality Date  . BACK SURGERY    . COLONOSCOPY     per notes from [facility]  . KNEE ARTHROSCOPY    . VAGINAL HYSTERECTOMY      Current Outpatient Medications  Medication Sig Dispense Refill  . acetaminophen (TYLENOL) 500 MG tablet Take 500 mg every 6 (six) hours as needed by mouth (pain).    Marland Kitchen atorvastatin (LIPITOR) 40 MG tablet Take 1 tablet (40 mg total) by mouth daily. 30 tablet 1  . cholecalciferol (VITAMIN D3) 25 MCG (1000 UNIT) tablet Take 1,000 Units by mouth daily.    . cyclobenzaprine (FLEXERIL) 10 MG tablet Take 10 mg by mouth at bedtime as needed.    Marland Kitchen ibuprofen (ADVIL) 800 MG tablet TAKE 1 TABLET (800 MG TOTAL) BY MOUTH EVERY 12 (TWELVE)  HOURS AS NEEDED. 60 tablet 2  . losartan-hydrochlorothiazide (HYZAAR) 50-12.5 MG tablet Take 1 tablet by mouth daily.    . metFORMIN (GLUCOPHAGE) 500 MG tablet Take 1,000 mg by mouth 2 (two) times daily with a meal.     . Omega-3 Fatty Acids (FISH OIL) 1000 MG CAPS Take 1,000 mg by mouth daily.    Marland Kitchen OVER THE COUNTER MEDICATION [place] chewable apple cider vinegar    . predniSONE (DELTASONE) 20 MG tablet Take by mouth. 60 mg x 3 days, then 40 mg x 3 days, then 20 mg x 3 days, then 10 mg (0.5 tablet) x 4 days, take with food.    . valACYclovir (VALTREX) 1000 MG tablet Take 1,000 mg by mouth 3 (three) times daily. X 7 days.     No current facility-administered medications for this visit.    Allergies as of 02/11/2020 - Review Complete 02/11/2020  Allergen Reaction Noted  . Other Other (See Comments) 01/19/2017  . Oxycontin [oxycodone]  02/10/2020    Vitals: BP 139/86 (BP Location: Right Arm, Patient Position: Sitting)   Pulse 87   Ht 5' (1.524 m)   Wt 226 lb (102.5 kg)   BMI 44.14 kg/m  Last Weight:  Wt Readings from Last 1 Encounters:  02/11/20 226 lb (102.5 kg)   Last Height:   Ht Readings from Last 1 Encounters:  02/11/20 5' (1.524 m)     Physical exam: Exam: Gen: NAD, conversant, well nourised, obese, well groomed                     CV: RRR, no MRG. No Carotid Bruits. No peripheral edema, warm, nontender Eyes: Conjunctivae clear without exudates or hemorrhage  Neuro: Detailed Neurologic Exam  Speech:    Speech is normal; fluent and spontaneous with normal comprehension.  Cognition:    The patient is oriented to person, place, and time;     recent and remote memory intact;     language fluent;     normal attention, concentration,     fund of knowledge Cranial Nerves:    The pupils are equal, round, and reactive to light. The fundi areflat. Visual fields are full to threat. Extraocular movements are intact. Trigeminal sensation is intact and the muscles of mastication  are normal.  Weakness of right eye closure with Bell's phenomenon, right lower facial weakness, weakness of raising right eyebrow.  The palate elevates in the midline. Hearing intact. Voice is normal. Shoulder shrug is normal. The tongue has normal motion without fasciculations.   Coordination:    No dysmetria or ataxia   Gait:    Heel-toe and tandem gait are  normal.   Motor Observation:    No asymmetry, no atrophy, and no involuntary movements noted. Tone:    Normal muscle tone.    Posture:    Posture is normal. normal erect    Strength:    Strength is V/V in the upper and lower limbs.      Sensation: intact to LT     Reflex Exam:  DTR's:    Deep tendon reflexes in the upper and lower extremities are normal bilaterally.   Toes:    The toes are downgoing bilaterally.   Clonus:    Clonus is absent.    Assessment/Plan: This is a 59 year old patient with Bell's palsy.  We discussed the etiology of Bell's palsy including viral infection.  Could be HSV, varicella-zoster, she is never had chickenpox so I will check her antibodies and she may consider being immunized if they are negative and definitely consider getting the shingles vaccine at her age.  We discussed eye care including keeping it moist, at night using gel, she has an eye patch, I did warn her that any scratches to the cornea or drying out of the cornea can lead to blindness, wear protective glasses, use eyedrops during the day often, gel at night.  Also discussed Bell's palsy, the outcome is usually good, we will get an MRI of the brain with and without contrast to ensure there is no other etiology such as tumor infiltration or tumor compression or other, will also get blood work today for some other uncommon causes of Bell's palsy.  We discussed there are surgical intervention should she not improve adequately.  Continue steroids and Valtrex already prescribed.  Orders Placed This Encounter  Procedures  . MR BRAIN W WO  CONTRAST  . B. burgdorfi Antibody  . HIV Antibody (routine testing w rflx)  . Angiotensin converting enzyme  . Sjogren's syndrome antibods(ssa + ssb)  . ANA, IFA (with reflex)  . Comprehensive metabolic panel  . CBC with Differential/Platelets  . TSH  . B12 and Folate Panel  . Methylmalonic acid, serum  . Varicella zoster antibody, IgG  . Ambulatory referral to Occupational Therapy     Cc: [name], MD,  [name], MD  Sarina Ill, MD  [facility] [address] [place], [place] [address]  Phone [phone] Fax [phone]

## 2020-02-11 NOTE — Patient Instructions (Signed)
Bloodwork MRI brain w/wo contrast Physical/occupational therapy   Bell Palsy, Adult  Bell palsy is a short-term inability to move muscles in part of the face. The inability to move (paralysis) results from inflammation or compression of the facial nerve, which travels along the skull and under the ear to the side of the face (7th cranial nerve). This nerve is responsible for facial movements that include blinking, closing the eyes, smiling, and frowning. What are the causes? The exact cause of this condition is not known. It may be caused by an infection from a virus, such as the chickenpox (herpes zoster), Epstein-Barr, or mumps virus. What increases the risk? You are more likely to develop this condition if:  You are pregnant.  You have diabetes.  You have had a recent infection in your nose, throat, or airways (upper respiratory infection).  You have a weakened body defense system (immune system).  You have had a facial injury, such as a fracture.  You have a family history of Bell palsy. What are the signs or symptoms? Symptoms of this condition include:  Weakness on one side of the face.  Drooping eyelid and corner of the mouth.  Excessive tearing in one eye.  Difficulty closing the eyelid.  Dry eye.  Drooling.  Dry mouth.  Changes in taste.  Change in facial appearance.  Pain behind one ear.  Ringing in one or both ears.  Sensitivity to sound in one ear.  Facial twitching.  Headache.  Impaired speech.  Dizziness.  Difficulty eating or drinking. Most of the time, only one side of the face is affected. Rarely, Bell palsy affects the whole face. How is this diagnosed? This condition is diagnosed based on:  Your symptoms.  Your medical history.  A physical exam. You may also have to see health care providers who specialize in disorders of the nerves (neurologist) or diseases and conditions of the eye (ophthalmologist). You may have tests, such  as:  A test to check for nerve damage (electromyogram).  Imaging studies, such as CT or MRI scans.  Blood tests. How is this treated? This condition affects every person differently. Sometimes symptoms go away without treatment within a couple weeks. If treatment is needed, it varies from person to person. The goal of treatment is to reduce inflammation and protect the eye from damage. Treatment for Bell palsy may include:  Medicines, such as: ? Steroids to reduce swelling and inflammation. ? Antiviral drugs. ? Pain relievers, including aspirin, acetaminophen, or ibuprofen.  Eye drops or ointment to keep your eye moist.  Eye protection, if you cannot close your eye.  Exercises or massage to regain muscle strength and function (physical therapy). Follow these instructions at home:   Take over-the-counter and prescription medicines only as told by your health care provider.  If your eye is affected: ? Keep your eye moist with eye drops or ointment as told by your health care provider. ? Follow instructions for eye care and protection as told by your health care provider.  Do any physical therapy exercises as told by your health care provider.  Keep all follow-up visits as told by your health care provider. This is important. Contact a health care provider if:  You have a fever.  Your symptoms do not get better within 2-3 weeks, or your symptoms get worse.  Your eye is red, irritated, or painful.  You have new symptoms. Get help right away if:  You have weakness or numbness in a part of  your body other than your face.  You have trouble swallowing.  You develop neck pain or stiffness.  You develop dizziness or shortness of breath. Summary  Bell palsy is a short-term inability to move muscles in part of the face. The inability to move (paralysis) results from inflammation or compression of the facial nerve.  This condition affects every person differently. Sometimes  symptoms go away without treatment within a couple weeks.  If treatment is needed, it varies from person to person. The goal of treatment is to reduce inflammation and protect the eye from damage.  Contact your health care provider if your symptoms do not get better within 2-3 weeks, or your symptoms get worse. This information is not intended to replace advice given to you by your health care provider. Make sure you discuss any questions you have with your health care provider. Document Revised: 02/01/2017 Document Reviewed: 04/24/2016 Elsevier Patient Education  2020 [employer].

## 2020-02-14 LAB — COMPREHENSIVE METABOLIC PANEL
ALT: 49 IU/L — ABNORMAL HIGH (ref 0–32)
AST: 27 IU/L (ref 0–40)
Albumin/Globulin Ratio: 1 — ABNORMAL LOW (ref 1.2–2.2)
Albumin: 4.1 g/dL (ref 3.8–4.9)
Alkaline Phosphatase: 126 IU/L — ABNORMAL HIGH (ref 44–121)
BUN/Creatinine Ratio: 25 — ABNORMAL HIGH (ref 9–23)
BUN: 20 mg/dL (ref 6–24)
Bilirubin Total: 0.4 mg/dL (ref 0.0–1.2)
CO2: 22 mmol/L (ref 20–29)
Calcium: 10.9 mg/dL — ABNORMAL HIGH (ref 8.7–10.2)
Chloride: 96 mmol/L (ref 96–106)
Creatinine, Ser: 0.79 mg/dL (ref 0.57–1.00)
GFR calc Af Amer: 96 mL/min/{1.73_m2} (ref >59)
GFR calc non Af Amer: 83 mL/min/{1.73_m2} (ref >59)
Globulin, Total: 4.3 g/dL (ref 1.5–4.5)
Glucose: 135 mg/dL — ABNORMAL HIGH (ref 65–99)
Potassium: 4.5 mmol/L (ref 3.5–5.2)
Sodium: 136 mmol/L (ref 134–144)
Total Protein: 8.4 g/dL (ref 6.0–8.5)

## 2020-02-14 LAB — SJOGREN'S SYNDROME ANTIBODS(SSA + SSB)
ENA SSA (RO) Ab: <0.2 AI (ref 0.0–0.9)
ENA SSB (LA) Ab: <0.2 AI (ref 0.0–0.9)

## 2020-02-14 LAB — TSH: TSH: 0.958 u[IU]/mL (ref 0.450–4.500)

## 2020-02-14 LAB — CBC WITH DIFFERENTIAL/PLATELET
Basophils Absolute: 0 10*3/uL (ref 0.0–0.2)
Basos: 0 %
EOS (ABSOLUTE): 0 10*3/uL (ref 0.0–0.4)
Eos: 0 %
Hematocrit: 38.5 % (ref 34.0–46.6)
Hemoglobin: 13 g/dL (ref 11.1–15.9)
Immature Grans (Abs): 0.1 10*3/uL (ref 0.0–0.1)
Immature Granulocytes: 1 %
Lymphocytes Absolute: 1.1 10*3/uL (ref 0.7–3.1)
Lymphs: 8 %
MCH: 28.9 pg (ref 26.6–33.0)
MCHC: 33.8 g/dL (ref 31.5–35.7)
MCV: 86 fL (ref 79–97)
Monocytes Absolute: 1.1 10*3/uL — ABNORMAL HIGH (ref 0.1–0.9)
Monocytes: 8 %
Neutrophils Absolute: 12 10*3/uL — ABNORMAL HIGH (ref 1.4–7.0)
Neutrophils: 83 %
Platelets: 490 10*3/uL — ABNORMAL HIGH (ref 150–450)
RBC: 4.5 x10E6/uL (ref 3.77–5.28)
RDW: 12.7 % (ref 11.7–15.4)
WBC: 14.2 10*3/uL — ABNORMAL HIGH (ref 3.4–10.8)

## 2020-02-14 LAB — ANGIOTENSIN CONVERTING ENZYME: Angio Convert Enzyme: 59 U/L (ref 14–82)

## 2020-02-14 LAB — B. BURGDORFI ANTIBODIES: Lyme IgG/IgM Ab: <0.91 {ISR} (ref 0.00–0.90)

## 2020-02-14 LAB — METHYLMALONIC ACID, SERUM: Methylmalonic Acid: 255 nmol/L (ref 0–378)

## 2020-02-14 LAB — HIV ANTIBODY (ROUTINE TESTING W REFLEX): HIV Screen 4th Generation wRfx: NONREACTIVE

## 2020-02-14 LAB — VARICELLA ZOSTER ANTIBODY, IGG: Varicella zoster IgG: 2416 index (ref >165)

## 2020-02-14 LAB — B12 AND FOLATE PANEL
Folate: >20 ng/mL (ref >3.0)
Vitamin B-12: 995 pg/mL (ref 232–1245)

## 2020-02-14 LAB — ANTINUCLEAR ANTIBODIES, IFA: ANA Titer 1: NEGATIVE

## 2020-02-15 ENCOUNTER — Telehealth: Payer: Self-pay | Admitting: *Deleted

## 2020-02-15 ENCOUNTER — Telehealth: Payer: Self-pay | Admitting: Neurology

## 2020-02-15 NOTE — Telephone Encounter (Signed)
Spoke with pt. She uses artificial tears. She stated she is not saying the eye drops are causing the ringing in her ears. She had noticed the ringing about 15 minutes after using the drops. Pt wanted to know if the ringing in the ears could be from the bell's palsy. I advised that it can. Pt is continuing her prednisone and valtrex until course complete. Pt asked if she would need to continue them any longer.

## 2020-02-15 NOTE — Telephone Encounter (Signed)
-----   Message from Melvenia Beam, MD sent at 02/14/2020  7:27 PM EST ----- So far your blood work looks fine, nothing concerning. You have very slightly abnormal liver enzymes, sometimes this is due to being overweight and I would review these with your primary care but again nothing concerning. Thanks, Dr. Jaynee Eagles

## 2020-02-15 NOTE — Telephone Encounter (Signed)
She should finish her medication as prescribed. Bells palsy can cause hearing changes thanks

## 2020-02-15 NOTE — Telephone Encounter (Signed)
Pt. states eye drops is causing ringing in her ears. Please advise.

## 2020-02-15 NOTE — Telephone Encounter (Signed)
Spoke with pt and advised her of Dr Cathren Laine message below. Pt aware to finish course of Valtrex and Prednisone and that there are no further planned courses per Dr Jaynee Eagles. Pt verbalized understanding. She will continue with plan to obtain MRI and do PT. She will return if symptoms worsen or fail to improve. Pt aware per Dr Jaynee Eagles that it can take awhile for improvement. She verbalized appreciation for the call.

## 2020-02-15 NOTE — Telephone Encounter (Signed)
Results discussed with pt. She will follow-up tomorrow with primary care. I advised pt that lab results are being sent to PCP. She verbalized appreciation.   Labs sent to PCP through epic.

## 2020-02-16 ENCOUNTER — Telehealth: Payer: Self-pay | Admitting: Neurology

## 2020-02-16 NOTE — Telephone Encounter (Signed)
no to the covid questions MR Brain w/wo contrast Dr. Ihor Dow auth: [place] Ref # [id number]. Patient is scheduled at [facility] for 02/17/20.

## 2020-02-17 ENCOUNTER — Ambulatory Visit: Payer: BC Managed Care – PPO

## 2020-02-17 DIAGNOSIS — G519 Disorder of facial nerve, unspecified: Secondary | ICD-10-CM | POA: Diagnosis not present

## 2020-02-17 DIAGNOSIS — R29810 Facial weakness: Secondary | ICD-10-CM | POA: Diagnosis not present

## 2020-02-17 DIAGNOSIS — G510 Bell's palsy: Secondary | ICD-10-CM

## 2020-02-17 MED ORDER — GADOBENATE DIMEGLUMINE 529 MG/ML IV SOLN
20.0000 mL | Freq: Once | INTRAVENOUS | Status: AC | PRN
  Administered 2020-02-17: 20 mL via INTRAVENOUS

## 2020-02-22 ENCOUNTER — Ambulatory Visit: Payer: BC Managed Care – PPO | Admitting: Physical Medicine and Rehabilitation

## 2020-02-23 ENCOUNTER — Other Ambulatory Visit: Payer: Self-pay

## 2020-02-23 ENCOUNTER — Ambulatory Visit: Payer: Self-pay

## 2020-02-23 ENCOUNTER — Encounter: Payer: Self-pay | Admitting: Physical Medicine and Rehabilitation

## 2020-02-23 ENCOUNTER — Telehealth: Payer: Self-pay | Admitting: Neurology

## 2020-02-23 ENCOUNTER — Ambulatory Visit (INDEPENDENT_AMBULATORY_CARE_PROVIDER_SITE_OTHER): Payer: BC Managed Care – PPO | Admitting: Physical Medicine and Rehabilitation

## 2020-02-23 VITALS — BP 178/95 | HR 93

## 2020-02-23 DIAGNOSIS — M5416 Radiculopathy, lumbar region: Secondary | ICD-10-CM

## 2020-02-23 MED ORDER — BETAMETHASONE SOD PHOS & ACET 6 (3-3) MG/ML IJ SUSP
12.0000 mg | Freq: Once | INTRAMUSCULAR | Status: AC
  Administered 2020-02-23: 12 mg

## 2020-02-23 NOTE — Progress Notes (Signed)
01/02/2010 L5-S1 interlam esi.  Pt state lower back pain that is travel up forward. Pt state climbing stair and bending over makes the pain worse. Pt state pain meds and heating pads.   Numeric Pain Rating Scale and Functional Assessment Average Pain 8   In the last MONTH (on 0-10 scale) has pain interfered with the following?  1. General activity like being  able to carry out your everyday physical activities such as walking, climbing stairs, carrying groceries, or moving a chair?  Rating(10)   +Driver, -BT, -Dye Allergies.

## 2020-02-23 NOTE — Telephone Encounter (Signed)
Pt. called to ask for MRI results from Dr. Please advise.

## 2020-02-23 NOTE — Telephone Encounter (Signed)
Linda Beam, MD  02/19/2020  6:24 PM EST Back to Top     MRI of the brain shows enhancement of 7th cranial nerve consistent with inflammation and Bell's Palsy(weakness of the face). I'd like patient to come back n 6 weeks to ensure she is improving and that we do not need another MRI. The 8th cranial nerve is also affected which is what may be causing the hearing issues she described. Please schedule her a follow up in 6-8 weeks with myself. Alternatively she can call if she hasn't signficantly improved in 6 weeks.

## 2020-02-23 NOTE — Telephone Encounter (Addendum)
Spoke with patient and discussed results of MRI brain. Patient verbalized understanding and she also asked if her balance could be affected. Pt states the ringing in her ears is worse and her ear hurts. She scheduled a f/u for 2/1 at 830 AM for 6 wk f/u w/ MD. Pt aware a message will be sent to MD with her question.

## 2020-02-23 NOTE — Progress Notes (Signed)
Linda Olson - 59 y.o. female MRN [id number]  Date of birth: 12-02-1960  Office Visit Note: Visit Date: 02/23/2020 PCP: [name], MD Referred by: [name], MD  Subjective: Chief Complaint  Patient presents with  . Lower Back - Pain   HPI:  Linda Olson is a 59 y.o. female who comes in today at the request of Dr. Rodell Perna for planned Bilateral L3-L4 Lumbar epidural steroid injection with fluoroscopic guidance.  The patient has failed conservative care including home exercise, medications, time and activity modification.  This injection will be diagnostic and hopefully therapeutic.  Please see requesting physician notes for further details and justification.  MRI reviewed with images and spine model.  MRI reviewed in the note below.    ROS Otherwise per HPI.  Assessment & Plan: Visit Diagnoses:    ICD-10-CM   1. Lumbar radiculopathy  M54.16 XR C-ARM NO REPORT    Epidural Steroid injection    betamethasone acetate-betamethasone sodium phosphate (CELESTONE) injection 12 mg    Plan: No additional findings.   Meds & Orders:  Meds ordered this encounter  Medications  . betamethasone acetate-betamethasone sodium phosphate (CELESTONE) injection 12 mg    Orders Placed This Encounter  Procedures  . XR C-ARM NO REPORT  . Epidural Steroid injection    Follow-up: Return for visit to requesting physician as needed.   Procedures: No procedures performed  Lumbar Epidural Steroid Injection - Interlaminar Approach with Fluoroscopic Guidance  Patient: Linda Olson      Date of Birth: 12-14-1960 MRN: [id number] PCP: [name], MD      Visit Date: 02/23/2020   Universal Protocol:     Consent Given By: the patient  Position: PRONE  Additional Comments: Vital signs were monitored before and after the procedure. Patient was prepped and draped in the usual sterile fashion. The correct patient, procedure, and site was verified.   Injection  Procedure Details:   Procedure diagnoses: Lumbar radiculopathy [M54.16]   Meds Administered:  Meds ordered this encounter  Medications  . betamethasone acetate-betamethasone sodium phosphate (CELESTONE) injection 12 mg     Laterality: Right  Location/Site:  L3-L4  Needle: 4.5 in., 20 ga. Tuohy  Needle Placement: Paramedian epidural  Findings:   -Comments: Excellent flow of contrast into the epidural space.  Procedure Details: Using a paramedian approach from the side mentioned above, the region overlying the inferior lamina was localized under fluoroscopic visualization and the soft tissues overlying this structure were infiltrated with 4 ml. of 1% Lidocaine without Epinephrine. The Tuohy needle was inserted into the epidural space using a paramedian approach.   The epidural space was localized using loss of resistance along with counter oblique bi-planar fluoroscopic views.  After negative aspirate for air, blood, and CSF, a 2 ml. volume of Isovue-250 was injected into the epidural space and the flow of contrast was observed. Radiographs were obtained for documentation purposes.    The injectate was administered into the level noted above.   Additional Comments:  The patient tolerated the procedure well Dressing: 2 x 2 sterile gauze and Band-Aid    Post-procedure details: Patient was observed during the procedure. Post-procedure instructions were reviewed.  Patient left the clinic in stable condition.     Clinical History: MRI LUMBAR SPINE WITHOUT CONTRAST   COMPARISON:  Lumbar spine radiographs 10/06/2019. Lumbar spine MRI 02/09/2010.  FINDINGS: Segmentation: 5 lumbar vertebrae (correlating with prior lumbar spine radiographs).  Alignment:  Mild L5-S1 fused grade 1 anterolisthesis.  Vertebrae:  Prior L5-S1 posterior decompression and posterior spinal fusion. Susceptibility artifact arising from the spinal fusion construct limits evaluation of marrow  signal at this level. Vertebral body height is maintained. Partially imaged nonspecific edema within the right iliac bone (series 4, image 1). No significant marrow edema or suspicious osseous lesion identified elsewhere.  Conus medullaris and cauda equina: Conus extends to the L1 level. No signal abnormality within the visualized distal spinal cord.  Paraspinal and other soft tissues: Incompletely assessed left renal cysts. Atrophy of the lumbar paraspinal musculature. Postsurgical changes to the dorsal soft tissues overlying the lower lumbar spine.  Disc levels:  Unless otherwise stated, the level by level findings below have not significantly changed since prior MRI 02/10/2010.  No more than mild disc degeneration at the non fused levels.  T12-L1: Small disc bulge. Mild facet arthrosis. No significant spinal canal or foraminal narrowing.  L1-L2: Mild facet arthrosis. No significant disc herniation or stenosis.  L2-L3: Minimal disc bulge. Mild facet arthrosis/ligamentum flavum hypertrophy. Mild left subarticular narrowing without nerve root impingement. Central canal patent. Mild bilateral neural foraminal narrowing.  L3-L4: Disc bulge. Superimposed shallow broad-based central disc protrusion. Moderate facet arthrosis. Mild bilateral subarticular narrowing with slight crowding of the descending L4 nerve roots (series 6, image 20). Mild relative narrowing of the central canal. Mild bilateral neural foraminal narrowing.  L4-L5: Post laminectomy changes, new from the prior examination. Tiny left center disc protrusion, new from the prior examination (series 6, image 26). Posterior element hypertrophy. No significant spinal canal stenosis. Mild right neural foraminal narrowing.  L5-S1: Interval posterior decompression and posterior spinal fusion. Small fluid collection within the laminectomy bed posterior to the thecal sac, measuring 1.5 x 0.4 cm in transaxial  dimensions and likely reflecting a seroma (series 6, image 29). Posterior element hypertrophy. No significant spinal canal stenosis. Susceptibility artifact limits evaluation of the neural foramina. No appreciable left neural foraminal narrowing. Suspected mild right neural foraminal narrowing related to endplate spurring and posterior element hypertrophy.  IMPRESSION: Comparison is made with the prior lumbar spine MRI of 02/10/2010. Lumbar spondylosis and postsurgical changes as described. Findings are most notably as follows.  At L5-S1, there has been interval posterior decompression and posterior spinal fusion. No significant spinal canal stenosis remains. Susceptibility artifact limits evaluation of the neural foramina. Suspected mild right neural foraminal narrowing secondary to endplate spurring and posterior element hypertrophy.  At L4-L5, post laminectomy changes are new from the prior MRI. There is a new tiny left center disc protrusion without spinal canal stenosis. Posterior element hypertrophy contributes to mild right neural foraminal narrowing.  At L3-L4, there is a disc bulge. Superimposed shallow broad-based central disc protrusion. Moderate facet arthrosis. Mild bilateral subarticular narrowing with slight crowding of the descending L4 nerve roots. Mild relative narrowing of the central canal. Mild bilateral neural foraminal narrowing. These findings are unchanged.  Partially imaged nonspecific edema within the right iliac bone.   Electronically Signed   By: Kellie Simmering DO   On: 01/10/2020 08:15     Objective:  VS:  HT:    WT:   BMI:     BP:(!) 178/95  HR:93bpm  TEMP: ( )  RESP:  Physical Exam Constitutional:      General: She is not in acute distress.    Appearance: Normal appearance. She is obese. She is not ill-appearing.  HENT:     Head: Normocephalic and atraumatic.     Right Ear: External ear normal.     Left Ear:  External ear  normal.  Eyes:     Extraocular Movements: Extraocular movements intact.  Cardiovascular:     Rate and Rhythm: Normal rate.     Pulses: Normal pulses.  Musculoskeletal:     Right lower leg: No edema.     Left lower leg: No edema.     Comments: Patient has good distal strength with no pain over the greater trochanters.  No clonus or focal weakness.  Skin:    Findings: No erythema, lesion or rash.  Neurological:     General: No focal deficit present.     Mental Status: She is alert and oriented to person, place, and time.     Sensory: No sensory deficit.     Motor: No weakness or abnormal muscle tone.     Coordination: Coordination normal.  Psychiatric:        Mood and Affect: Mood normal.        Behavior: Behavior normal.      Imaging: No results found.

## 2020-02-24 NOTE — Telephone Encounter (Signed)
I'd like to see her sooner, We may need another MRI of the brain, the last one showed nerve enhancement in 7th and 8th cranial nerves which is a bit unusual. If she is not getting better we should reimage. See if amy or megan have anything thanks

## 2020-02-24 NOTE — Telephone Encounter (Signed)
Spoke with pt. She said she worsened before the MRI was completed but thought it would go away. In regards to balance, pt stated she has felt off balance some in the morning when she gets up but it only comes and goes. It did not happen this AM. Pt was scheduled for the next soonest appt available which was 1/26 @ 2:00 pm. Pt added to cancellation list. She was also advised to go to the ER if she worsens. Her questions were answered.

## 2020-02-24 NOTE — Telephone Encounter (Signed)
DO I have anything open at all in January?

## 2020-02-29 ENCOUNTER — Telehealth: Payer: Self-pay | Admitting: Neurology

## 2020-02-29 NOTE — Telephone Encounter (Signed)
Thanks

## 2020-02-29 NOTE — Telephone Encounter (Signed)
Linda Olson, can you put her on my schedule within the next 2 weeks? Do I have anything open at all?

## 2020-02-29 NOTE — Telephone Encounter (Signed)
I have the 3pm spot on hold for pt on 1/6. I left her a vm letting her know the spot is on hold for her and to please call us back if she is available for this appt.

## 2020-03-07 NOTE — Procedures (Signed)
Lumbar Epidural Steroid Injection - Interlaminar Approach with Fluoroscopic Guidance  Patient: Linda Olson      Date of Birth: [birth year] MRN: [id number] PCP: Sigmund Hazel, MD      Visit Date: 02/23/2020   Universal Protocol:     Consent Given By: the patient  Position: PRONE  Additional Comments: Vital signs were monitored before and after the procedure. Patient was prepped and draped in the usual sterile fashion. The correct patient, procedure, and site was verified.   Injection Procedure Details:   Procedure diagnoses: Lumbar radiculopathy [M54.16]   Meds Administered:  Meds ordered this encounter  Medications  . betamethasone acetate-betamethasone sodium phosphate (CELESTONE) injection 12 mg     Laterality: Right  Location/Site:  L3-L4  Needle: 4.5 in., 20 ga. Tuohy  Needle Placement: Paramedian epidural  Findings:   -Comments: Excellent flow of contrast into the epidural space.  Procedure Details: Using a paramedian approach from the side mentioned above, the region overlying the inferior lamina was localized under fluoroscopic visualization and the soft tissues overlying this structure were infiltrated with 4 ml. of 1% Lidocaine without Epinephrine. The Tuohy needle was inserted into the epidural space using a paramedian approach.   The epidural space was localized using loss of resistance along with counter oblique bi-planar fluoroscopic views.  After negative aspirate for air, blood, and CSF, a 2 ml. volume of Isovue-250 was injected into the epidural space and the flow of contrast was observed. Radiographs were obtained for documentation purposes.    The injectate was administered into the level noted above.   Additional Comments:  The patient tolerated the procedure well Dressing: 2 x 2 sterile gauze and Band-Aid    Post-procedure details: Patient was observed during the procedure. Post-procedure instructions were reviewed.  Patient left the  clinic in stable condition.

## 2020-03-08 ENCOUNTER — Telehealth: Payer: Self-pay | Admitting: Neurology

## 2020-03-08 ENCOUNTER — Other Ambulatory Visit: Payer: BC Managed Care – PPO

## 2020-03-08 DIAGNOSIS — Z20822 Contact with and (suspected) exposure to covid-19: Secondary | ICD-10-CM

## 2020-03-08 NOTE — Telephone Encounter (Signed)
..   Pt understands that although there may be some limitations with this type of visit, we will take all precautions to reduce any security or privacy concerns.  Pt understands that this will be treated like an in office visit and we will file with pt's insurance, and there may be a patient responsible charge related to this service. ? ?

## 2020-03-10 ENCOUNTER — Telehealth (INDEPENDENT_AMBULATORY_CARE_PROVIDER_SITE_OTHER): Payer: BC Managed Care – PPO | Admitting: Neurology

## 2020-03-10 DIAGNOSIS — G510 Bell's palsy: Secondary | ICD-10-CM | POA: Diagnosis not present

## 2020-03-10 LAB — NOVEL CORONAVIRUS, NAA: SARS-CoV-2, NAA: DETECTED — AB

## 2020-03-10 LAB — SARS-COV-2, NAA 2 DAY TAT

## 2020-03-10 NOTE — Progress Notes (Signed)
[facility]    Provider:  Dr Lucia Gaskins Requesting Provider: Sigmund Hazel, MD Primary Care Provider:  Sigmund Hazel, MD  CC:  Bells palsy  Virtual Visit via Video Note  I connected with Linda Olson on 03/13/20 at  3:00 PM EST by a video enabled telemedicine application and verified that I am speaking with the correct person using two identifiers.  Location: Patient: Home  Provider: work   I discussed the limitations of evaluation and management by telemedicine and the availability of in person appointments. The patient expressed understanding and agreed to proceed.  Follow Up Instructions:    I discussed the assessment and treatment plan with the patient. The patient was provided an opportunity to ask questions and all were answered. The patient agreed with the plan and demonstrated an understanding of the instructions.   The patient was advised to call back or seek an in-person evaluation if the symptoms worsen or if the condition fails to improve as anticipated.  I provided 20 minutes of non-face-to-face time during this encounter.   Anson Fret, MD    She is approx 3 weeks out from episode of Bells Palsy. Mri showed enhancement of 7th and 8th cranial nerves.  just caught covid from her grandson. Her eye is "leaking", her symptoms are a little better, her talking is a little better, she has ringing in the ear and ear pain but also improved. She is really off balance. No hearing problems. I recommended an MRi of the brain since 2 cranial nerves were potentially involved, but she can't do it financially, and since she is improving we will follow clinically  HPI:  Linda Olson is a 60 y.o. female here as requested by Sigmund Hazel, MD for Bells palsy. Started 2 days ago. She has already been started on Valtrex. She was at work 2 days ago and the right eye kept draining, one of her workers noticed something on her face or her face didn't look right,  at the end of the day she felt her speech was affected. Her smile is affected. No numbness. Started 2 days ago. Not worsening. Difficult to talk. She is using lubrication in the eye, wear glasses, she is using gel lubrication at night and has an eye patch. No numbness. Affecting her hearing sounds loud. She has not bitten her tongue. Prior ti this a few months she had an illness in her throat and chest and Monday night she felt a little of the same.   Reviewed notes, labs and imaging from outside physicians, which showed:  CT Head 03/30/2013:   Ct showed No acute intracranial abnormalities including mass lesion or mass effect, hydrocephalus, extra-axial fluid collection, midline shift, hemorrhage, or acute infarction, large ischemic events (personally reviewed images)     Review of Systems: Patient complains of symptoms per HPI as well as the following symptoms: facial weakness. Pertinent negatives and positives per HPI. All others negative.   Social History   Socioeconomic History  . Marital status: Single    Spouse name: Not on file  . Number of children: Not on file  . Years of education: Not on file  . Highest education level: Not on file  Occupational History  . Not on file  Tobacco Use  . Smoking status: Never Smoker  . Smokeless tobacco: Never Used  Substance and Sexual Activity  . Alcohol use: No  . Drug use: No  . Sexual activity: Not on file  Other Topics Concern  .  Not on file  Social History Narrative   Lives at home with sister   Caffeine max of 1 cup in a day but not daily    Social Determinants of Health   Financial Resource Strain: Not on file  Food Insecurity: Not on file  Transportation Needs: Not on file  Physical Activity: Not on file  Stress: Not on file  Social Connections: Not on file  Intimate Partner Violence: Not on file    Family History  Problem Relation Age of Onset  . High blood pressure Sister   . Stroke Maternal Aunt   . Breast  cancer Neg Hx     Past Medical History:  Diagnosis Date  . BMI 40.0-44.9, adult ([place])   . Hyperlipidemia   . Hypertension   . Low vitamin D level    per notes from [employer]  . Obesity    per notes from [facility]  . Orbital floor fracture [facility])    [facility]; per notes from [facility]  . Prediabetes    per notes from [facility]  . Preeclampsia    per notes from [facility]    Patient Active Problem List   Diagnosis Date Noted  . BMI 40.0-44.9, adult ([place])   . Hypertension   . Bell's palsy 02/11/2020  . Lumbar contusion 11/04/2019  . History of lumbar fusion 10/06/2019  . Fall 10/06/2019  . Contusion of right shoulder 10/06/2019  . Chest pain 03/19/2019  . HTN (hypertension) 03/19/2019  . HLD (hyperlipidemia) 03/19/2019  . Type 2 diabetes mellitus ([place]) 03/19/2019  . Pre-diabetes 11/12/2017    Past Surgical History:  Procedure Laterality Date  . BACK SURGERY    . COLONOSCOPY     per [facility] from [facility]  . KNEE ARTHROSCOPY    . VAGINAL HYSTERECTOMY      Current Outpatient Medications  Medication Sig Dispense Refill  . acetaminophen (TYLENOL) 500 MG tablet Take 500 mg every 6 (six) hours as needed by mouth (pain).    Marland Kitchen atorvastatin (LIPITOR) 40 MG tablet Take 1 tablet (40 mg total) by mouth daily. 30 tablet 1  . cholecalciferol (VITAMIN D3) 25 MCG (1000 UNIT) tablet Take 1,000 Units by mouth daily.    . cyclobenzaprine (FLEXERIL) 10 MG tablet Take 10 mg by mouth at bedtime as needed.    Marland Kitchen ibuprofen (ADVIL) 800 MG tablet TAKE 1 TABLET (800 MG TOTAL) BY MOUTH EVERY 12 (TWELVE) HOURS AS NEEDED. 60 tablet 2  . losartan-hydrochlorothiazide (HYZAAR) 50-12.5 MG tablet Take 1 tablet by mouth daily.    . metFORMIN (GLUCOPHAGE) 500 MG tablet Take 1,000 mg by mouth 2 (two) times daily with a meal.     . Omega-3 Fatty Acids (FISH OIL) 1000 MG CAPS Take 1,000 mg by mouth daily.    Marland Kitchen OVER THE COUNTER MEDICATION [place] chewable apple cider  vinegar    . predniSONE (DELTASONE) 20 MG tablet Take by mouth. 60 mg x 3 days, then 40 mg x 3 days, then 20 mg x 3 days, then 10 mg (0.5 tablet) x 4 days, take with food.    . valACYclovir (VALTREX) 1000 MG tablet Take 1,000 mg by mouth 3 (three) times daily. X 7 days.     No current facility-administered medications for this visit.    Allergies as of 03/10/2020 - Review Complete 02/23/2020  Allergen Reaction Noted  . Other Other (See Comments) 01/19/2017  . Oxycontin [oxycodone]  02/10/2020    Vitals: There were no  vitals taken for this visit. Last Weight:  Wt Readings from Last 1 Encounters:  02/11/20 226 lb (102.5 kg)   Last Height:   Ht Readings from Last 1 Encounters:  02/11/20 5' (1.524 m)   Slightly improved right lower facial weakness and eye closure on video today  PRIOR(video today)  Physical exam: Exam: Gen: NAD, conversant, well nourised, obese, well groomed                     CV: RRR, no MRG. No Carotid Bruits. No peripheral edema, warm, nontender Eyes: Conjunctivae clear without exudates or hemorrhage  Neuro: Detailed Neurologic Exam  Speech:    Speech is normal; fluent and spontaneous with normal comprehension.  Cognition:    The patient is oriented to person, place, and time;     recent and remote memory intact;     language fluent;     normal attention, concentration,     fund of knowledge Cranial Nerves:    The pupils are equal, round, and reactive to light. The fundi areflat. Visual fields are full to threat. Extraocular movements are intact. Trigeminal sensation is intact and the muscles of mastication are normal.  Weakness of right eye closure with Bell's phenomenon, right lower facial weakness, weakness of raising right eyebrow.  The palate elevates in the midline. Hearing intact. Voice is normal. Shoulder shrug is normal. The tongue has normal motion without fasciculations.   Coordination:    No dysmetria or ataxia   Gait:    Heel-toe and  tandem gait are normal.   Motor Observation:    No asymmetry, no atrophy, and no involuntary movements noted. Tone:    Normal muscle tone.    Posture:    Posture is normal. normal erect    Strength:    Strength is V/V in the upper and lower limbs.      Sensation: intact to LT     Reflex Exam:  DTR's:    Deep tendon reflexes in the upper and lower extremities are normal bilaterally.   Toes:    The toes are downgoing bilaterally.   Clonus:    Clonus is absent.    Assessment/Plan: This is a 60 year old patient with Bell's palsy.  We discussed the etiology of Bell's palsy including viral infection.  She is approx 3 weeks out from episode of Bells Palsy. Mri showed enhancement of 7th and 8th cranial nerves.  just caught covid from her grandson so this is a video visit. Her eye is "leaking", her symptoms are a little better, her talking is a little better, she has ringing in the ear and ear pain but also improved. She is really off balance. No hearing problems. I recommended an MRI of the brain since 2 cranial nerves were potentially involved, but she can't do it financially, and since she is improving we will follow clinically. She is getting an appoitnment for OT for her face.     Cc: [name], MD,  [name], MD  Sarina Ill, MD  [facility] [address] [place], [place] [address]  Phone [phone] Fax [phone]

## 2020-03-11 ENCOUNTER — Encounter: Payer: Self-pay | Admitting: Physician Assistant

## 2020-03-11 ENCOUNTER — Telehealth: Payer: Self-pay | Admitting: Physician Assistant

## 2020-03-11 DIAGNOSIS — I10 Essential (primary) hypertension: Secondary | ICD-10-CM | POA: Insufficient documentation

## 2020-03-11 DIAGNOSIS — E1159 Type 2 diabetes mellitus with other circulatory complications: Secondary | ICD-10-CM | POA: Insufficient documentation

## 2020-03-11 DIAGNOSIS — Z6841 Body Mass Index (BMI) 40.0 and over, adult: Secondary | ICD-10-CM | POA: Insufficient documentation

## 2020-03-11 NOTE — Telephone Encounter (Addendum)
Called to discuss with patient about COVID-19 symptoms and the use of one of the available treatments for those with mild to moderate Covid symptoms and at a high risk of hospitalization.  Pt appears to qualify for outpatient treatment due to co-morbid conditions and/or a member of an at-risk group in accordance with the FDA Emergency Use Authorization.    Symptom onset: 1/1 (on day 7 today) Vaccinated yes with moderdna Booster? no Qualifiers: BMI, HTN, DMT2, High SVI  Unfortunately pt does not qualify for anitviral medications because >day 5. I offered her IV remdesivir but she is worried about potential cost. It is 2700$ applied to insurance. She declines at this time due to worry about cost.   Angelena Form

## 2020-03-13 ENCOUNTER — Encounter: Payer: Self-pay | Admitting: Neurology

## 2020-03-28 ENCOUNTER — Other Ambulatory Visit: Payer: Self-pay

## 2020-03-28 ENCOUNTER — Ambulatory Visit: Payer: BC Managed Care – PPO | Attending: Neurology | Admitting: Occupational Therapy

## 2020-03-28 ENCOUNTER — Encounter: Payer: Self-pay | Admitting: Occupational Therapy

## 2020-03-28 DIAGNOSIS — R278 Other lack of coordination: Secondary | ICD-10-CM

## 2020-03-28 DIAGNOSIS — R29810 Facial weakness: Secondary | ICD-10-CM | POA: Diagnosis not present

## 2020-03-28 DIAGNOSIS — R29818 Other symptoms and signs involving the nervous system: Secondary | ICD-10-CM

## 2020-03-28 DIAGNOSIS — R202 Paresthesia of skin: Secondary | ICD-10-CM | POA: Diagnosis not present

## 2020-03-29 NOTE — Therapy (Signed)
[place] [place], [place], [address] Phone: [phone]   Fax:  [phone]  Occupational Therapy Evaluation  Patient Details  Name: Linda Olson MRN: [id number] Date of Birth: 10/24/1960 Referring Provider (OT): Sarina Ill, MD   Encounter Date: 03/28/2020   OT End of Session - 03/28/20 0841    Visit Number 1    Number of Visits 9    Date for OT Re-Evaluation 05/09/20    Authorization Type BCBS    Progress Note Due on Visit 10    OT Start Time 0800    OT Stop Time 0840    OT Time Calculation (min) 40 min    Activity Tolerance Patient tolerated treatment well    Behavior During Therapy [facility] for tasks assessed/performed           Past Medical History:  Diagnosis Date  . BMI 40.0-44.9, adult ([place])   . Hyperlipidemia   . Hypertension   . Low vitamin D level    per notes from [employer]  . Obesity    per notes from [facility]  . Orbital floor fracture [facility])    [place] [place] [facility]; per notes from [facility]  . Prediabetes    per notes from [facility]  . Preeclampsia    per notes from [place]    Past Surgical History:  Procedure Laterality Date  . BACK SURGERY    . COLONOSCOPY     per notes from [facility]  . KNEE ARTHROSCOPY    . VAGINAL HYSTERECTOMY      There were no vitals filed for this visit.   Subjective Assessment - 03/28/20 0803    Subjective  Pt believes her symptoms have improved since onset around early December, but reports continuing to have difficulty with her eye draining and moving the muscles around her mouth.    Pertinent History HTN, HLD, pre-DM type II, BMI 40-44.9    Patient Stated Goals Continue to have symptoms get better    Currently in Pain? 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 1   July   Has the patient had a decrease in activity level because of a fear of falling?  No    Is the patient reluctant to leave their home because of a fear of falling?  No      Home  Environment   Family/patient expects to be discharged to: Private residence    Lives With Family   Sister     Prior Function   Level of Independence Independent    Vocation Full time employment    Press photographer work; works [employer]   Leisure watching TV, spending time with family, bowling      ADL   Eating/Feeding Modified independent   Pt reports eating has improved since onset, but is still difficult. Limitations with chewing on R side; bites tongue and inside of cheek.  Grooming Teeth care   Difficulty brushing teeth; reports being unable to rinse and spit or swish mouthwash at this time, letting water run out instead   ADL comments Pt reports no difficulties with IADLs      Vision - History   Baseline Vision Wears glasses all the time   Prior to onset only wore glasses for computer work/reading   Patient Visual Report Eye fatigue/eye pain/headache    Additional Comments Pt reports occasional "film"/"blurriness" impacting R eye      Vision Assessment   Ocular Range of Motion Within Functional Limits    Tracking/Visual Pursuits Impaired - to be further tested in functional context   Decreased smoothness and significant jumping in sporadic/variable positions moving through all planes   Comment MD report indicates potential involvement of 8th cranial nerve as well      Observation/Other Assessments   Observations Facial droop on R side of face both at rest and with attempted muscle activation (e.g. smiling). Mild tearing and decreased closure of R eye also observed.   Pt  is able to assist with full closure of R eye using finger and reports continuing to wear eye patch as needed     Sensation   Additional Comments Altered sensation on R side around mouth; pt reports hand "feel rough or tingly compared to L side"   Pt reports no numbness            OT Education - 03/28/20 2044    Education Details Education provided on the role and purpose of OT. Pt did express concern about symptoms moving to affect L side of face and OT discussed that Bell's Palsy typically only affects one side of the face, rarely both sides. OT encouraged pt call physician to further discuss concerns, if needed, or if symptoms did begin on L side.    Person(s) Educated Patient    Methods Explanation    Comprehension Verbalized understanding;Need further instruction             OT Short Term Goals - 03/29/20 0817      OT SHORT TERM GOAL #1   Title Pt will be independent with initial HEP designed for facial ROM/strengthening, reporting carryover at home at least 75% of the time.    Baseline No exercises for R-sided 7th cranial n. palsy at this time    Time 4    Period Weeks    Status New    Target Date 04/25/20      OT SHORT TERM GOAL #2   Title Pt will independently identify and demonstrate compliance with visual exercises/visual compensation strategies, including use of eye patch as needed.    Baseline Significant jumping demo'd with visual tracking during evaluation; no exercises for vision    Time 4    Period Weeks    Status New             OT Long Term Goals - 03/29/20 0830      OT LONG TERM GOAL #1   Title Pt will be able to chew various textured foods effectively using both sides of the mouth during at least 2 meals/day.    Baseline Pt reports consistently biting tongue and inside of cheek when attempting to chew foods on R side    Time 8    Period Weeks    Status New    Target Date 05/23/20      OT LONG TERM GOAL #2   Title After brushing teeth, pt will  be able to rinse and spit effectively at least 75% of the time.    Baseline Pt reports currently being unable to spit during teeth care, letting water run out instead    Time 8    Period Weeks    Status New      OT LONG TERM GOAL #3   Title Pt will be independent with full, pt-specific HEP, reporting carryover at home at least 90% of the time.    Baseline No current HEP    Time 8    Period Weeks    Status New             Plan - 03/28/20 2056    Clinical Impression Statement Pt is a 60 y.o. female who presents to OP OT with complaints related to 7th cranial n. palsy (onset 02/09/20); recent MRI showed enhancement of 7th/8th cranial nerves. PMHx includes HLD, HTN, pre-DM, and BMI 40-45. Pt currently lives her sister in a private residence. Pt would benefit from skilled occupational therapy services to address weakness, decreased/altered sensation, pain, neuromuscular re-education, adaptive/compensatory strategies, and vision in order to improve participation and safety with BADL/IADLs.    OT Occupational Profile and History Detailed Assessment- Review of Records and additional review of physical, cognitive, psychosocial history related to current functional performance    Occupational performance deficits (Please refer to evaluation for details): ADL's;Leisure;Work;Social Participation    Body Structure / Function / Physical Skills ADL;Body mechanics;Pain;Vision;Balance;Vestibular;Coordination;Hearing;Sensation;Decreased knowledge of use of DME;Strength;IADL;ROM    Psychosocial Skills Environmental  Adaptations    Rehab Potential Good    Clinical Decision Making Several treatment options, min-mod task modification necessary    Comorbidities Affecting Occupational Performance: May have comorbidities impacting occupational performance    Modification or Assistance to Complete Evaluation  Min-Moderate modification of tasks or assist with assess necessary to complete eval    OT Frequency 1x /  week   Pt requested 1x/week due to current work schedule/commute   OT Duration 8 weeks    OT Treatment/Interventions Self-care/ADL training;DME and/or AE instruction;Moist Heat;Therapeutic activities;Ultrasound;Biofeedback;Therapeutic exercise;Cryotherapy;Neuromuscular education;Passive range of motion;Visual/perceptual remediation/compensation;Patient/family education;Energy conservation;Electrical Stimulation;Manual Therapy;Coping strategies training    Plan Initiate exercises for ROM/strengthening of facial muscles; assess vision in functional context    Consulted and Agree with Plan of Care Patient           Patient will benefit from skilled therapeutic intervention in order to improve the following deficits and impairments:   Body Structure / Function / Physical Skills: ADL,Body mechanics,Pain,Vision,Balance,Vestibular,Coordination,Hearing,Sensation,Decreased knowledge of use of DME,Strength,IADL,ROM   Psychosocial Skills: Environmental  Adaptations   Visit Diagnosis: Facial weakness  Paresthesia of skin  Other symptoms and signs involving the nervous system  Other lack of coordination    Problem List Patient Active Problem List   Diagnosis Date Noted  . BMI 40.0-44.9, adult ([place])   . Hypertension   . Bell's palsy 02/11/2020  . Lumbar contusion 11/04/2019  . History of lumbar fusion 10/06/2019  . Fall 10/06/2019  . Contusion of right shoulder 10/06/2019  . Chest pain 03/19/2019  . HTN (hypertension) 03/19/2019  . HLD (hyperlipidemia) 03/19/2019  . Type 2 diabetes mellitus ([place]) 03/19/2019  . Pre-diabetes 11/12/2017     Kathrine Cords, OTR/L, MSOT 03/29/2020, 10:57 AM  [place] [place], [place], [address] Phone: [phone]   Fax:  [phone]  Name: Linda Olson MRN: [id number] Date of Birth: 12-07-1960

## 2020-03-29 NOTE — Addendum Note (Signed)
Addended by: Kathrine Cords on: 03/29/2020 11:01 AM   Modules accepted: Orders

## 2020-03-30 ENCOUNTER — Ambulatory Visit: Payer: Self-pay | Admitting: Neurology

## 2020-04-04 ENCOUNTER — Other Ambulatory Visit: Payer: Self-pay

## 2020-04-04 ENCOUNTER — Encounter: Payer: Self-pay | Admitting: Occupational Therapy

## 2020-04-04 ENCOUNTER — Ambulatory Visit: Payer: BC Managed Care – PPO | Admitting: Occupational Therapy

## 2020-04-04 DIAGNOSIS — R202 Paresthesia of skin: Secondary | ICD-10-CM

## 2020-04-04 DIAGNOSIS — R29818 Other symptoms and signs involving the nervous system: Secondary | ICD-10-CM | POA: Diagnosis not present

## 2020-04-04 DIAGNOSIS — R278 Other lack of coordination: Secondary | ICD-10-CM | POA: Diagnosis not present

## 2020-04-04 DIAGNOSIS — R29810 Facial weakness: Secondary | ICD-10-CM | POA: Diagnosis not present

## 2020-04-05 ENCOUNTER — Ambulatory Visit: Payer: Self-pay | Admitting: Neurology

## 2020-04-05 NOTE — Therapy (Addendum)
[address] [place], [place], [address] Phone: [phone]   Fax:  [phone]  Occupational Therapy Treatment & D/C Summary  Patient Details  Name: Linda Olson MRN: [id number] Date of Birth: 01-04-1961 Referring Provider (OT): Sarina Ill, MD   Encounter Date: 04/04/2020   OT End of Session - 04/04/20 2316    Visit Number 2    Number of Visits 9    Date for OT Re-Evaluation 05/09/20    Authorization Type BCBS    Progress Note Due on Visit 10    OT Start Time 1700    OT Stop Time 1734    OT Time Calculation (min) 34 min    Activity Tolerance Patient tolerated treatment well    Behavior During Therapy [facility] for tasks assessed/performed           Past Medical History:  Diagnosis Date  . BMI 40.0-44.9, adult ([place])   . Hyperlipidemia   . Hypertension   . Low vitamin D level    per notes from [employer] [facility]  . Obesity    per notes from [facility]  . Orbital floor fracture [facility])    [place] [facility]; per notes from [facility]  . Prediabetes    per notes from [facility]  . Preeclampsia    per notes from [place]    Past Surgical History:  Procedure Laterality Date  . BACK SURGERY    . COLONOSCOPY     per notes from [facility]  . KNEE ARTHROSCOPY    . VAGINAL HYSTERECTOMY      There were no vitals filed for this visit.   Subjective Assessment - 04/04/20 2315    Subjective  Pt reports naturally finding ways to compensate for deficits during functional activities (e.g., cutting up food into small bites, drinking with a straw on unaffected side) and was receptive to additional compensatory/adaptive strategies and exercises that were discussed.    Pertinent History HTN, HLD, pre-DM type II, BMI 40-44.9    Patient Stated Goals Continue to have symptoms get better    Currently in Pain? No/denies           Treatment: OT problem-solved with pt to identify and develop  compensatory strategies related to vision, eating, and grooming; see "Education Details" and "Short-Term/Long-Term Goals" sections below for more.  OT discussed facial exercises with pt including, raising both eyebrows, furrowing eyebrows, scrunching nose, blowing out cheeks, puckering lips, and smiling. Pt was receptive to exercises and able to return demonstration of each.     OT Education - 04/04/20 2315    Education Details Session focused on providing education regarding compensatory strategies related to pt-specific functional deficits at this time. [place], CCC-SLP also provided brief consult at start of session to discuss more specific compensatory strategies to help pt continue to manage symptoms of dysarthia. OT also provided additional facial exercises and massage techniques for pt to carryover to home.    Person(s) Educated Patient    Methods Explanation;Handout    Comprehension Verbalized understanding            OT Short Term Goals - 04/04/20 2317      OT SHORT TERM GOAL #1   Title Pt will be independent with initial HEP designed for facial ROM/strengthening, reporting carryover at home at least 75% of the time.    Baseline No exercises for R-sided 7th cranial n. palsy at this time    Time 4  Period Weeks    Status Partially Met   OT provided facial exercises today and pt reports already completing some at home that she has found to be beneficial   Target Date 04/25/20      OT SHORT TERM GOAL #2   Title Pt will independently identify and demonstrate compliance with visual exercises/visual compensation strategies, including use of eye patch as needed.    Baseline Significant jumping demo'd with visual tracking during evaluation; no exercises for vision    Time 4    Period Weeks    Status Partially Met   OT discussed seeking out visual-perceptual activities (e.g., reading, crosswords, word searches) to improve visual tracking/scanning; Pt reports compliance with eye  protection recs from physician and no functional deficits due to vision at this time           OT Long Term Goals - 04/04/20 2318      OT LONG TERM GOAL #1   Title Pt will be able to chew various textured foods effectively using both sides of the mouth during at least 2 meals/day.    Baseline Pt reports consistently biting tongue and inside of cheek when attempting to chew foods on R side    Time 8    Period Weeks    Status Not Met      OT LONG TERM GOAL #2   Title After brushing teeth, pt will be able to rinse and spit effectively at least 75% of the time.    Baseline Pt reports currently being unable to spit during teeth care, letting water run out instead    Time 8    Period Weeks    Status Partially Met   Pt reports developing sufficient compensatory strategy related to rinsing after brushing teeth at home     OT LONG TERM GOAL #3   Title Pt will be independent with full, pt-specific HEP, reporting carryover at home at least 90% of the time.    Baseline No current HEP    Time 8    Period Weeks    Status Partially Met   Pt appears complaint with recs from ref provider and also reports carryover of strategies, techniques, and exercises that OT discussed with pt during evaluation. Due to nature of condition, further additions to HEP would not be appropriate at this time.           Plan - 04/04/20 2316    Clinical Impression Statement Pt symptoms appear to have improved some since previous visit. 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 OT encouraged pt to call again within the next few weeks if new functional deficits develop or if pt believes returning to therapy in a few weeks would be beneficial based on functional  status. Pt was receptive.    OT Occupational Profile and History Detailed Assessment- Review of Records and additional review of physical, cognitive, psychosocial history related to current functional performance    Occupational performance deficits (Please refer to evaluation for details): ADL's;Leisure;Work;Social Participation    Body Structure / Function / Physical Skills ADL;Body mechanics;Pain;Vision;Balance;Vestibular;Coordination;Hearing;Sensation;Decreased knowledge of use of DME;Strength;IADL;ROM    Psychosocial Skills Environmental  Adaptations    Rehab Potential Good    Clinical Decision Making Several treatment options, min-mod task modification necessary  Comorbidities Affecting Occupational Performance: May have comorbidities impacting occupational performance    Modification or Assistance to Complete Evaluation  Min-Moderate modification of tasks or assist with assess necessary to complete eval    OT Frequency 1x / week   Pt requested 1x/week due to current work schedule/commute   OT Duration 8 weeks    OT Treatment/Interventions Self-care/ADL training;DME and/or AE instruction;Moist Heat;Therapeutic activities;Ultrasound;Biofeedback;Therapeutic exercise;Cryotherapy;Neuromuscular education;Passive range of motion;Visual/perceptual remediation/compensation;Patient/family education;Energy conservation;Electrical Stimulation;Manual Therapy;Coping strategies training    Plan Put pt on hold for 2 weeks; Will d/c at that time if no further therapy is needed    Consulted and Agree with Plan of Care Patient           Patient will benefit from skilled therapeutic intervention in order to improve the following deficits and impairments:   Body Structure / Function / Physical Skills: ADL,Body mechanics,Pain,Vision,Balance,Vestibular,Coordination,Hearing,Sensation,Decreased knowledge of use of DME,Strength,IADL,ROM   Psychosocial Skills: Environmental  Adaptations   OCCUPATIONAL THERAPY  DISCHARGE SUMMARY  Visits from Start of Care: 2  Current functional level related to goals / functional outcomes: Pt reported naturally finding methods to compensate for functional deficits and was able to complete recommended activities independently   Remaining deficits: Cranial n. palsy   Education / Equipment: Compensatory strategies; visual/facial exercises  Plan:                                                    Patient goals were partially met. Patient is being discharged due to not returning since the last visit.   Pt put on hold and encouraged to call back if she would like to return to OP therapy due to development of additional functional limitations; pt did not request to return to OT and is appropriate for d/c ????      Visit Diagnosis: Facial weakness  Paresthesia of skin  Other symptoms and signs involving the nervous system  Other lack of coordination    Problem List Patient Active Problem List   Diagnosis Date Noted  . BMI 40.0-44.9, adult ([place])   . Hypertension   . Bell's palsy 02/11/2020  . Lumbar contusion 11/04/2019  . History of lumbar fusion 10/06/2019  . Fall 10/06/2019  . Contusion of right shoulder 10/06/2019  . Chest pain 03/19/2019  . HTN (hypertension) 03/19/2019  . HLD (hyperlipidemia) 03/19/2019  . Type 2 diabetes mellitus ([place]) 03/19/2019  . Pre-diabetes 11/12/2017     Kathrine Cords, OTR/L, MSOT 04/05/2020, 8:37 PM  [address] [place], [place], [address] Phone: [phone]   Fax:  [phone]  Name: Linda Olson MRN: [id number] Date of Birth: 1960-08-25

## 2020-04-09 ENCOUNTER — Encounter (INDEPENDENT_AMBULATORY_CARE_PROVIDER_SITE_OTHER): Payer: Self-pay

## 2020-04-17 ENCOUNTER — Other Ambulatory Visit: Payer: Self-pay | Admitting: Orthopaedic Surgery

## 2020-04-18 NOTE — Telephone Encounter (Signed)
Please advise. Thanks.  

## 2020-04-28 ENCOUNTER — Encounter (INDEPENDENT_AMBULATORY_CARE_PROVIDER_SITE_OTHER): Payer: Self-pay | Admitting: Family Medicine

## 2020-04-28 ENCOUNTER — Other Ambulatory Visit: Payer: Self-pay

## 2020-04-28 ENCOUNTER — Ambulatory Visit (INDEPENDENT_AMBULATORY_CARE_PROVIDER_SITE_OTHER): Payer: BC Managed Care – PPO | Admitting: Family Medicine

## 2020-04-28 VITALS — BP 165/96 | HR 82 | Temp 98.0°F | Ht 60.0 in | Wt 237.0 lb

## 2020-04-28 DIAGNOSIS — F3289 Other specified depressive episodes: Secondary | ICD-10-CM | POA: Diagnosis not present

## 2020-04-28 DIAGNOSIS — E65 Localized adiposity: Secondary | ICD-10-CM

## 2020-04-28 DIAGNOSIS — Z6841 Body Mass Index (BMI) 40.0 and over, adult: Secondary | ICD-10-CM

## 2020-04-28 DIAGNOSIS — E785 Hyperlipidemia, unspecified: Secondary | ICD-10-CM

## 2020-04-28 DIAGNOSIS — R0602 Shortness of breath: Secondary | ICD-10-CM

## 2020-04-28 DIAGNOSIS — M545 Low back pain, unspecified: Secondary | ICD-10-CM | POA: Diagnosis not present

## 2020-04-28 DIAGNOSIS — Z9189 Other specified personal risk factors, not elsewhere classified: Secondary | ICD-10-CM

## 2020-04-28 DIAGNOSIS — R0683 Snoring: Secondary | ICD-10-CM

## 2020-04-28 DIAGNOSIS — I152 Hypertension secondary to endocrine disorders: Secondary | ICD-10-CM

## 2020-04-28 DIAGNOSIS — R5383 Other fatigue: Secondary | ICD-10-CM | POA: Diagnosis not present

## 2020-04-28 DIAGNOSIS — E1169 Type 2 diabetes mellitus with other specified complication: Secondary | ICD-10-CM

## 2020-04-28 DIAGNOSIS — G8929 Other chronic pain: Secondary | ICD-10-CM

## 2020-04-28 DIAGNOSIS — Z0289 Encounter for other administrative examinations: Secondary | ICD-10-CM

## 2020-04-28 DIAGNOSIS — E1159 Type 2 diabetes mellitus with other circulatory complications: Secondary | ICD-10-CM

## 2020-04-29 DIAGNOSIS — R5383 Other fatigue: Secondary | ICD-10-CM | POA: Diagnosis not present

## 2020-04-29 DIAGNOSIS — R0602 Shortness of breath: Secondary | ICD-10-CM | POA: Diagnosis not present

## 2020-05-01 LAB — COMPREHENSIVE METABOLIC PANEL
ALT: 29 IU/L (ref 0–32)
AST: 24 IU/L (ref 0–40)
Albumin/Globulin Ratio: 1.4 (ref 1.2–2.2)
Albumin: 4 g/dL (ref 3.8–4.9)
Alkaline Phosphatase: 130 IU/L — ABNORMAL HIGH (ref 44–121)
BUN/Creatinine Ratio: 16 (ref 9–23)
BUN: 12 mg/dL (ref 6–24)
Bilirubin Total: 0.3 mg/dL (ref 0.0–1.2)
CO2: 24 mmol/L (ref 20–29)
Calcium: 9.7 mg/dL (ref 8.7–10.2)
Chloride: 100 mmol/L (ref 96–106)
Creatinine, Ser: 0.76 mg/dL (ref 0.57–1.00)
GFR calc Af Amer: 101 mL/min/{1.73_m2} (ref >59)
GFR calc non Af Amer: 87 mL/min/{1.73_m2} (ref >59)
Globulin, Total: 2.8 g/dL (ref 1.5–4.5)
Glucose: 129 mg/dL — ABNORMAL HIGH (ref 65–99)
Potassium: 5 mmol/L (ref 3.5–5.2)
Sodium: 138 mmol/L (ref 134–144)
Total Protein: 6.8 g/dL (ref 6.0–8.5)

## 2020-05-01 LAB — CBC WITH DIFFERENTIAL/PLATELET
Basophils Absolute: 0 10*3/uL (ref 0.0–0.2)
Basos: 1 %
EOS (ABSOLUTE): 0.3 10*3/uL (ref 0.0–0.4)
Eos: 6 %
Hemoglobin: 12.8 g/dL (ref 11.1–15.9)
Immature Grans (Abs): 0 10*3/uL (ref 0.0–0.1)
Immature Granulocytes: 0 %
Lymphocytes Absolute: 1 10*3/uL (ref 0.7–3.1)
Lymphs: 19 %
MCH: 30 pg (ref 26.6–33.0)
MCHC: 33.4 g/dL (ref 31.5–35.7)
MCV: 90 fL (ref 79–97)
Monocytes Absolute: 0.6 10*3/uL (ref 0.1–0.9)
Monocytes: 11 %
Neutrophils Absolute: 3.4 10*3/uL (ref 1.4–7.0)
Neutrophils: 63 %
Platelets: 327 10*3/uL (ref 150–450)
RBC: 4.27 x10E6/uL (ref 3.77–5.28)
RDW: 12.5 % (ref 11.7–15.4)
WBC: 5.4 10*3/uL (ref 3.4–10.8)

## 2020-05-01 LAB — LIPID PANEL
Chol/HDL Ratio: 3.9 ratio (ref 0.0–4.4)
Cholesterol, Total: 231 mg/dL — ABNORMAL HIGH (ref 100–199)
HDL: 59 mg/dL (ref >39)
LDL Chol Calc (NIH): 157 mg/dL — ABNORMAL HIGH (ref 0–99)
Triglycerides: 84 mg/dL (ref 0–149)
VLDL Cholesterol Cal: 15 mg/dL (ref 5–40)

## 2020-05-01 LAB — ANEMIA PANEL
Ferritin: 100 ng/mL (ref 15–150)
Folate, Hemolysate: 324 ng/mL
Folate, RBC: 846 ng/mL (ref >498)
Hematocrit: 38.3 % (ref 34.0–46.6)
Iron Saturation: 22 % (ref 15–55)
Iron: 69 ug/dL (ref 27–159)
Retic Ct Pct: 1.8 % (ref 0.6–2.6)
Total Iron Binding Capacity: 311 ug/dL (ref 250–450)
UIBC: 242 ug/dL (ref 131–425)
Vitamin B-12: 755 pg/mL (ref 232–1245)

## 2020-05-01 LAB — VITAMIN D 25 HYDROXY (VIT D DEFICIENCY, FRACTURES): Vit D, 25-Hydroxy: 21.9 ng/mL — ABNORMAL LOW (ref 30.0–100.0)

## 2020-05-01 LAB — INSULIN, RANDOM: INSULIN: 22.1 u[IU]/mL (ref 2.6–24.9)

## 2020-05-01 LAB — HEMOGLOBIN A1C
Est. average glucose Bld gHb Est-mCnc: 189 mg/dL
Hgb A1c MFr Bld: 8.2 % — ABNORMAL HIGH (ref 4.8–5.6)

## 2020-05-01 LAB — TSH: TSH: 1.21 u[IU]/mL (ref 0.450–4.500)

## 2020-05-01 LAB — T4, FREE: Free T4: 1.15 ng/dL (ref 0.82–1.77)

## 2020-05-02 NOTE — Progress Notes (Signed)
Dear Dr. Lorin Mercy,   Thank you for referring Linda Olson to our clinic. The following note includes my evaluation and treatment recommendations.  Chief Complaint:   OBESITY Linda Olson (MR# [id number]) is a 60 y.o. female who presents for evaluation and treatment of obesity and related comorbidities. Current BMI is Body mass index is 46.29 kg/m. Linda Olson has been struggling with her weight for many years and has been unsuccessful in either losing weight, maintaining weight loss, or reaching her healthy weight goal.  Linda Olson is currently in the action stage of change and ready to dedicate time achieving and maintaining a healthier weight. Linda Olson is interested in becoming our patient and working on intensive lifestyle modifications including (but not limited to) diet and exercise for weight loss.  Linda Olson was referred by Dr. Lorin Mercy of Orthopedics.  She had a fall last July that affected her above her fusion.  Linda Olson's habits were reviewed today and are as follows: Her family eats meals together, she thinks her family will eat healthier with her, her desired weight loss is 52 lbs, she has been heavy most of her life, she started gaining excessive weight 4 years ago, her heaviest weight ever was 265 pounds, she craves sweets, she snacks frequently in the evenings, she wakes up frequently in the middle of the night to eat, she is frequently drinking liquids with calories, she frequently makes poor food choices and she struggles with emotional eating.  Depression Screen Linda Olson's Food and Mood (modified PHQ-9) score was 8.  Depression screen Linda Olson 2/9 04/28/2020  Decreased Interest 0  Down, Depressed, Hopeless 1  PHQ - 2 Score 1  Altered sleeping 1  Tired, decreased energy 2  Change in appetite 2  Feeling bad or failure about yourself  2  Trouble concentrating 0  Moving slowly or fidgety/restless 0  Suicidal thoughts 0  PHQ-9 Score 8   Assessment/Plan:   1.  Other fatigue Linda Olson admits to daytime somnolence and reports waking up still tired. Linda Olson has a history of symptoms of daytime fatigue, morning fatigue, morning headache and snoring. Linda Olson generally gets 6 hours of sleep per night, and states that she has poor quality sleep. Snoring is present. Apneic episodes are not present. Epworth Sleepiness Score is 7.  Linda Olson does feel that her weight is causing her energy to be lower than it should be. Fatigue may be related to obesity, depression or many other causes. Labs will be ordered, and in the meanwhile, Linda Olson will focus on self care including making healthy food choices, increasing physical activity and focusing on stress reduction.  - EKG 12-Lead - CBC with Differential/Platelet - Comprehensive metabolic panel - Anemia panel - VITAMIN D 25 Hydroxy (Vit-D Deficiency, Fractures) - TSH - T4, free  2. SOB (shortness of breath) on exertion Linda Olson notes increasing shortness of breath with exercising and seems to be worsening over time with weight gain. She notes getting out of breath sooner with activity than she used to. This has gotten worse recently. Linda Olson denies shortness of breath at rest or orthopnea.  Linda Olson does feel that she gets out of breath more easily that she used to when she exercises. Linda Olson's shortness of breath appears to be obesity related and exercise induced. She has agreed to work on weight loss and gradually increase exercise to treat her exercise induced shortness of breath. Will continue to monitor closely.  - CBC with Differential/Platelet - Comprehensive metabolic panel - Anemia panel  3. Visceral obesity Current  visceral fat rating: 18. Visceral fat rating should be < 13. Visceral adipose tissue is a hormonally active component of total body fat. This body composition phenotype is associated with medical disorders such as metabolic syndrome, cardiovascular disease and several malignancies  including prostate, breast, and colorectal cancers. Starting goal: Lose 7-10% of starting weight.   4. Chronic low back pain History of lumbar fusion.  Followed by Dr. Lorin Mercy in Orthopedics.   5. Hyperlipidemia associated with type 2 diabetes mellitus ([place]) Course: Not at goal. Lipid-lowering medications: atorvastatin 40 mg daily.   Plan: Dietary changes: Increase soluble fiber, decrease simple carbohydrates, decrease saturated fat. Exercise changes: Moderate to vigorous-intensity aerobic activity 150 minutes per week or as tolerated. We will continue to monitor along with PCP/specialists as it pertains to her weight loss journey.  Will check lipid panel today.  Lab Results  Component Value Date   CHOL 231 (H) 04/29/2020   HDL 59 04/29/2020   LDLCALC 157 (H) 04/29/2020   TRIG 84 04/29/2020   CHOLHDL 3.9 04/29/2020   Lab Results  Component Value Date   ALT 29 04/29/2020   AST 24 04/29/2020   ALKPHOS 130 (H) 04/29/2020   BILITOT 0.3 04/29/2020   The 10-year ASCVD risk score Linda Bussing DC Jr., Linda al., Linda Olson) is: 29.2%   Values used to calculate the score:     Age: 89 years     Sex: Female     Is Non-Hispanic African American: Yes     Diabetic: Yes     Tobacco smoker: No     Systolic Blood Pressure: 791 mmHg     Is BP treated: Yes     HDL Cholesterol: 59 mg/dL     Total Cholesterol: 231 mg/dL  - Lipid panel  6. Hypertension associated with type 2 diabetes mellitus ([place]) Not at goal. Medications: Hyzaar 50.12.5 mg daily.    Plan: Avoid buying foods that are: processed, frozen, or prepackaged to avoid excess salt. We will continue to monitor closely alongside her PCP and/or Specialist.  Regular follow up with PCP and specialists was also encouraged.  Will check labs today.  BP Readings from Last 3 Encounters:  04/28/20 (!) 165/96  02/23/20 (!) 178/95  02/11/20 139/86   Lab Results  Component Value Date   CREATININE 0.76 04/29/2020   - CBC with Differential/Platelet -  Comprehensive metabolic panel - TSH - T4, free  7. Type 2 diabetes mellitus with other specified complication, without long-term current use of insulin (HCC) Diabetes Mellitus: Not at goal. Medication: metformin 1,000 mg twice daily. Issues reviewed: blood sugar goals, complications of diabetes mellitus, hypoglycemia prevention and treatment, exercise, and nutrition.  A1c was 6.6 on 05/14/2019.   Plan:  Stop metformin.  Start Ozempic 0.25 mg subcutaneously weekly.  Will check CMP, A1c, and insulin level today.  The importance of regular follow up with PCP and all other specialists as scheduled was stressed to patient today.  Lab Results  Component Value Date   HGBA1C 8.2 (H) 04/29/2020   HGBA1C 6.2 (H) 03/19/2019   Lab Results  Component Value Date   LDLCALC 157 (H) 04/29/2020   CREATININE 0.76 04/29/2020   - Comprehensive metabolic panel - Hemoglobin A1c - Insulin, random  8. Snores Kamylle endorses snoring and morning headaches.  Epworth score is 7.  9. Other depression, with emotional eating Not at goal. Medication: None.  Meghin eats when bored and/or sad.  Plan:  Behavior modification techniques were discussed today to help deal with emotional/non-hunger  eating behaviors.  10. At risk for heart disease Due to Nakima's current state of health and medical condition(s), she is at a higher risk for heart disease.  This puts the patient at much greater risk to subsequently develop cardiopulmonary conditions that can significantly affect patient's quality of life in a negative manner.    At least 10 minutes were spent on counseling Linda Olson about these concerns today. Counseling:  Intensive lifestyle modifications were discussed with Linda Olson as the most appropriate first line of treatment.  she will continue to work on diet, exercise, and weight loss efforts.  We will continue to reassess these conditions on a fairly regular basis in an attempt to decrease the patient's  overall morbidity and mortality.  Evidence-based interventions for health behavior change were utilized today including the discussion of self monitoring techniques, problem-solving barriers, and SMART goal setting techniques.  Specifically, regarding patient's less desirable eating habits and patterns, we employed the technique of small changes when Yana has not been able to fully commit to her prudent nutritional plan.  11. Class 3 severe obesity with serious comorbidity and body mass index (BMI) of 45.0 to 49.9 in adult, unspecified obesity type (HCC)  Linda Olson is currently in the action stage of change and her goal is to continue with weight loss efforts. I recommend Linda Olson begin the structured treatment plan as follows:  She has agreed to the Category 2 Plan.  Exercise goals: No exercise has been prescribed at this time.   Behavioral modification strategies: increasing lean protein intake, decreasing simple carbohydrates, increasing vegetables, increasing water intake, decreasing liquid calories, decreasing alcohol intake, decreasing sodium intake and increasing high fiber foods.  She was informed of the importance of frequent follow-up visits to maximize her success with intensive lifestyle modifications for her multiple health conditions. She was informed we would discuss her lab results at her next visit unless there is a critical issue that needs to be addressed sooner. Zierra agreed to keep her next visit at the agreed upon time to discuss these results.  Objective:   Blood pressure (!) 165/96, pulse 82, temperature 98 F (36.7 C), temperature source Oral, height 5' (1.524 m), weight 237 lb (107.5 kg), SpO2 97 %. Body mass index is 46.29 kg/m.  EKG: Normal sinus rhythm, rate 79 bpm.  Indirect Calorimeter completed today shows a VO2 of 232 and a REE of 1613.  Her calculated basal metabolic rate is 8299 thus her basal metabolic rate is worse than expected.  General:  Cooperative, alert, well developed, in no acute distress. HEENT: Conjunctivae and lids unremarkable. Cardiovascular: Regular rhythm.  Lungs: Normal work of breathing. Neurologic: No focal deficits.   Lab Results  Component Value Date   CREATININE 0.76 04/29/2020   BUN 12 04/29/2020   NA 138 04/29/2020   K 5.0 04/29/2020   CL 100 04/29/2020   CO2 24 04/29/2020   Lab Results  Component Value Date   ALT 29 04/29/2020   AST 24 04/29/2020   ALKPHOS 130 (H) 04/29/2020   BILITOT 0.3 04/29/2020   Lab Results  Component Value Date   HGBA1C 8.2 (H) 04/29/2020   HGBA1C 6.2 (H) 03/19/2019   Lab Results  Component Value Date   INSULIN 22.1 04/29/2020   Lab Results  Component Value Date   TSH 1.210 04/29/2020   Lab Results  Component Value Date   CHOL 231 (H) 04/29/2020   HDL 59 04/29/2020   LDLCALC 157 (H) 04/29/2020   TRIG 84 04/29/2020  CHOLHDL 3.9 04/29/2020   Lab Results  Component Value Date   WBC 5.4 04/29/2020   HGB 12.8 04/29/2020   HCT 38.3 04/29/2020   MCV 90 04/29/2020   PLT 327 04/29/2020   Lab Results  Component Value Date   IRON 69 04/29/2020   TIBC 311 04/29/2020   FERRITIN 100 04/29/2020   Attestation Statements:   This is the patient's first visit at [facility]. The patient's NEW PATIENT PACKET was reviewed at length. Included in the packet: current and past health history, medications, allergies, ROS, gynecologic history (women only), surgical history, family history, social history, weight history, weight loss surgery history (for those that have had weight loss surgery), nutritional evaluation, mood and food questionnaire, PHQ9, Epworth questionnaire, sleep habits questionnaire, patient life and health improvement goals questionnaire. These will all be scanned into the patient's chart under media.   During the visit, I independently reviewed the patient's EKG, bioimpedance scale results, and indirect calorimeter results. I used  this information to tailor a meal plan for the patient that will help her to lose weight and will improve her obesity-related conditions going forward. I performed a medically necessary appropriate examination and/or evaluation. I discussed the assessment and treatment plan with the patient. The patient was provided an opportunity to ask questions and all were answered. The patient agreed with the plan and demonstrated an understanding of the instructions. Labs were ordered at this visit and will be reviewed at the next visit unless more critical results need to be addressed immediately. Clinical information was updated and documented in the EMR.   I, Water quality scientist, CMA, am acting as transcriptionist for Briscoe Deutscher, DO  I have reviewed the above documentation for accuracy and completeness, and I agree with the above. Briscoe Deutscher, DO

## 2020-05-18 ENCOUNTER — Encounter (INDEPENDENT_AMBULATORY_CARE_PROVIDER_SITE_OTHER): Payer: Self-pay | Admitting: Family Medicine

## 2020-05-18 ENCOUNTER — Ambulatory Visit (INDEPENDENT_AMBULATORY_CARE_PROVIDER_SITE_OTHER): Payer: BC Managed Care – PPO | Admitting: Family Medicine

## 2020-05-18 ENCOUNTER — Other Ambulatory Visit: Payer: Self-pay

## 2020-05-18 VITALS — BP 149/97 | HR 82 | Temp 98.4°F | Ht 60.0 in | Wt 228.0 lb

## 2020-05-18 DIAGNOSIS — M545 Low back pain, unspecified: Secondary | ICD-10-CM

## 2020-05-18 DIAGNOSIS — Z9189 Other specified personal risk factors, not elsewhere classified: Secondary | ICD-10-CM

## 2020-05-18 DIAGNOSIS — E1169 Type 2 diabetes mellitus with other specified complication: Secondary | ICD-10-CM

## 2020-05-18 DIAGNOSIS — I152 Hypertension secondary to endocrine disorders: Secondary | ICD-10-CM

## 2020-05-18 DIAGNOSIS — E1159 Type 2 diabetes mellitus with other circulatory complications: Secondary | ICD-10-CM | POA: Diagnosis not present

## 2020-05-18 DIAGNOSIS — Z6841 Body Mass Index (BMI) 40.0 and over, adult: Secondary | ICD-10-CM

## 2020-05-18 DIAGNOSIS — E785 Hyperlipidemia, unspecified: Secondary | ICD-10-CM

## 2020-05-18 DIAGNOSIS — E559 Vitamin D deficiency, unspecified: Secondary | ICD-10-CM | POA: Diagnosis not present

## 2020-05-18 DIAGNOSIS — G8929 Other chronic pain: Secondary | ICD-10-CM

## 2020-05-18 MED ORDER — OZEMPIC (0.25 OR 0.5 MG/DOSE) 2 MG/1.5ML ~~LOC~~ SOPN
0.2500 mg | PEN_INJECTOR | SUBCUTANEOUS | 0 refills | Status: DC
Start: 2020-05-18 — End: 2020-06-02

## 2020-05-19 MED ORDER — CHOLECALCIFEROL 1.25 MG (50000 UT) PO TABS: ORAL_TABLET | ORAL | 0 refills | Status: DC

## 2020-05-19 NOTE — Progress Notes (Signed)
Chief Complaint:   OBESITY Linda Olson is here to discuss her progress with her obesity treatment plan along with follow-up of her obesity related diagnoses.   Today's visit was #: 2 Starting weight: 237 lbs Starting date: 04/28/2020 Today's weight: 228 lbs Today's date: 05/18/2020 Total lbs lost to date: 9 lbs Body mass index is 44.53 kg/m.  Total weight loss percentage to date: -3.80%  Interim History:  Linda Olson that the weekends are challenging.  She endorses polyphagia.  She has been having less shortness of breath with exercise with her current weigh tloss.  Current Meal Plan: the Category 2 Plan for 80-90% of the time.  Current Exercise Plan: None.  Assessment/Plan:   1. Type 2 diabetes mellitus with other specified complication, without long-term current use of insulin (HCC) Diabetes Mellitus: Not at goal. Medication: None. Issues reviewed: blood sugar goals, complications of diabetes mellitus, hypoglycemia prevention and treatment, exercise, and nutrition.  Plan:  Start Ozempic 0.25 mg subcutaneously weekly, as per below.  The importance of regular follow up with PCP and all other specialists as scheduled was stressed to patient today.  Lab Results  Component Value Date   HGBA1C 8.2 (H) 04/29/2020   HGBA1C 6.2 (H) 03/19/2019   Lab Results  Component Value Date   LDLCALC 157 (H) 04/29/2020   CREATININE 0.76 04/29/2020   - Start Semaglutide,0.25 or 0.5MG /DOS, (OZEMPIC, 0.25 OR 0.5 MG/DOSE,) 2 MG/1.5ML SOPN; Inject 0.25 mg into the skin once a week.  Dispense: 1.5 mL; Refill: 0  2. Hyperlipidemia associated with type 2 diabetes mellitus ([place]) Course: Not at goal. Lipid-lowering medications: Lipitor 40 mg daily.   Plan: Dietary changes: Increase soluble fiber, decrease simple carbohydrates, decrease saturated fat. Exercise changes: Moderate to vigorous-intensity aerobic activity 150 minutes per week or as tolerated. We will continue to monitor along with  PCP/specialists as it pertains to her weight loss journey.  Lab Results  Component Value Date   CHOL 231 (H) 04/29/2020   HDL 59 04/29/2020   LDLCALC 157 (H) 04/29/2020   TRIG 84 04/29/2020   CHOLHDL 3.9 04/29/2020   Lab Results  Component Value Date   ALT 29 04/29/2020   AST 24 04/29/2020   ALKPHOS 130 (H) 04/29/2020   BILITOT 0.3 04/29/2020   The 10-year ASCVD risk score Mikey Bussing DC Jr., et al., 2013) is: 21.9%   Values used to calculate the score:     Age: 60 years     Sex: Female     Is Non-Hispanic African American: Yes     Diabetic: Yes     Tobacco smoker: No     Systolic Blood Pressure: 175 mmHg     Is BP treated: Yes     HDL Cholesterol: 59 mg/dL     Total Cholesterol: 231 mg/dL  3. Vitamin D deficiency Not at goal. Current vitamin D is 21.9, tested on 04/29/2020. Optimal goal > 50 ng/dL.   Plan: Start to take prescription Vitamin D @50 ,000 IU every week as prescribed.  Follow-up for routine testing of Vitamin D, at least 2-3 times per year to avoid over-replacement.  - Start Cholecalciferol 1.25 MG (50000 UT) TABS; 50,000 units PO qwk for 12 weeks.  Dispense: 4 tablet; Refill: 0  4. Hypertension associated with type 2 diabetes mellitus ([place]) Not at goal. Medications: Hyzaar 50-12.5 mg daily.   Plan: Avoid buying foods that are: processed, frozen, or prepackaged to avoid excess salt. We will continue to monitor closely alongside her PCP and/or  Specialist.  Regular follow up with PCP and specialists was also encouraged.   BP Readings from Last 3 Encounters:  05/18/20 (!) 149/97  04/28/20 (!) 165/96  02/23/20 (!) 178/95   Lab Results  Component Value Date   CREATININE 0.76 04/29/2020   5. Chronic low back pain, history of lumbar fusion, Followed by Dr. Lorin Mercy Will continue to monitor as it relates to her weight loss journey.  6. At risk for heart disease Due to Linda Olson's current state of health and medical condition(s), she is at a higher risk for heart  disease.  This puts the patient at much greater risk to subsequently develop cardiopulmonary conditions that can significantly affect patient's quality of life in a negative manner.    At least 8 minutes were spent on counseling Linda Olson about these concerns today. Evidence-based interventions for health behavior change were utilized today including the discussion of self monitoring techniques, problem-solving barriers, and SMART goal setting techniques.  Specifically, regarding patient's less desirable eating habits and patterns, we employed the technique of small changes when Linda Olson has not been able to fully commit to her prudent nutritional plan.  7. Class 3 severe obesity with serious comorbidity and body mass index (BMI) of 40.0 to 44.9 in adult, unspecified obesity type ([place])  Course: Linda Olson is currently in the action stage of change. As such, her goal is to continue with weight loss efforts.   Nutrition goals: She has agreed to the Category 2 Plan but sub smoothie in the morning.   Exercise goals: Walking 30 minutes per day.  Behavioral modification strategies: increasing lean protein intake, decreasing simple carbohydrates, increasing vegetables and increasing water intake.  Linda Olson has agreed to follow-up with our clinic in 3 weeks. She was informed of the importance of frequent follow-up visits to maximize her success with intensive lifestyle modifications for her multiple health conditions.   Objective:   Blood pressure (!) 149/97, pulse 82, temperature 98.4 F (36.9 C), temperature source Oral, height 5' (1.524 m), weight 228 lb (103.4 kg), SpO2 98 %. Body mass index is 44.53 kg/m.  General: Cooperative, alert, well developed, in no acute distress. HEENT: Conjunctivae and lids unremarkable. Cardiovascular: Regular rhythm.  Lungs: Normal work of breathing. Neurologic: No focal deficits.   Lab Results  Component Value Date   CREATININE 0.76 04/29/2020   BUN 12  04/29/2020   NA 138 04/29/2020   K 5.0 04/29/2020   CL 100 04/29/2020   CO2 24 04/29/2020   Lab Results  Component Value Date   ALT 29 04/29/2020   AST 24 04/29/2020   ALKPHOS 130 (H) 04/29/2020   BILITOT 0.3 04/29/2020   Lab Results  Component Value Date   HGBA1C 8.2 (H) 04/29/2020   HGBA1C 6.2 (H) 03/19/2019   Lab Results  Component Value Date   INSULIN 22.1 04/29/2020   Lab Results  Component Value Date   TSH 1.210 04/29/2020   Lab Results  Component Value Date   CHOL 231 (H) 04/29/2020   HDL 59 04/29/2020   LDLCALC 157 (H) 04/29/2020   TRIG 84 04/29/2020   CHOLHDL 3.9 04/29/2020   Lab Results  Component Value Date   WBC 5.4 04/29/2020   HGB 12.8 04/29/2020   HCT 38.3 04/29/2020   MCV 90 04/29/2020   PLT 327 04/29/2020   Lab Results  Component Value Date   IRON 69 04/29/2020   TIBC 311 04/29/2020   FERRITIN 100 04/29/2020   Attestation Statements:   Reviewed by clinician on  day of visit: allergies, medications, problem list, medical history, surgical history, family history, social history, and previous encounter notes.  I, Water quality scientist, CMA, am acting as transcriptionist for Briscoe Deutscher, DO  I have reviewed the above documentation for accuracy and completeness, and I agree with the above. Briscoe Deutscher, DO

## 2020-06-02 ENCOUNTER — Ambulatory Visit (INDEPENDENT_AMBULATORY_CARE_PROVIDER_SITE_OTHER): Payer: BC Managed Care – PPO | Admitting: Physician Assistant

## 2020-06-02 ENCOUNTER — Other Ambulatory Visit: Payer: Self-pay

## 2020-06-02 ENCOUNTER — Encounter (INDEPENDENT_AMBULATORY_CARE_PROVIDER_SITE_OTHER): Payer: Self-pay | Admitting: Physician Assistant

## 2020-06-02 VITALS — BP 144/84 | HR 74 | Temp 98.5°F | Ht 60.0 in | Wt 228.0 lb

## 2020-06-02 DIAGNOSIS — E1169 Type 2 diabetes mellitus with other specified complication: Secondary | ICD-10-CM | POA: Diagnosis not present

## 2020-06-02 DIAGNOSIS — E785 Hyperlipidemia, unspecified: Secondary | ICD-10-CM

## 2020-06-02 DIAGNOSIS — Z6841 Body Mass Index (BMI) 40.0 and over, adult: Secondary | ICD-10-CM

## 2020-06-02 DIAGNOSIS — Z9189 Other specified personal risk factors, not elsewhere classified: Secondary | ICD-10-CM | POA: Diagnosis not present

## 2020-06-02 MED ORDER — OZEMPIC (0.25 OR 0.5 MG/DOSE) 2 MG/1.5ML ~~LOC~~ SOPN
0.5000 mg | PEN_INJECTOR | SUBCUTANEOUS | 0 refills | Status: AC
Start: 2020-06-02 — End: 2020-07-02

## 2020-06-07 NOTE — Progress Notes (Signed)
Chief Complaint:   OBESITY Linda Olson is here to discuss her progress with her obesity treatment plan along with follow-up of her obesity related diagnoses. Linda Olson is on the Category 2 Plan and states she is following her eating plan approximately 80% of the time. Linda Olson states she is doing 0 minutes 0 times per week.  Today's visit was #: 3 Starting weight: 237 lbs Starting date: 04/28/2020 Today's weight: 228 lbs Today's date: 06/02/2020 Total lbs lost to date: 9 Total lbs lost since last in-office visit: 0  Interim History: Linda Olson reports that she had recent deaths in her family and she did some comfort eating. She is eating too many snack calories on some days.  Subjective:   1. Type 2 diabetes mellitus with other specified complication, without long-term current use of insulin (HCC) Linda Olson is on 6.76 mg without complications. She reports hunger between breakfast and lunch.  2. Hyperlipidemia associated with type 2 diabetes mellitus ([place]) Linda Olson is on Lipitor and she denies chest pain.  3. At risk for heart disease Linda Olson is at a higher than average risk for cardiovascular disease due to obesity.   Assessment/Plan:   1. Type 2 diabetes mellitus with other specified complication, without long-term current use of insulin (HCC) Good blood sugar control is important to decrease the likelihood of diabetic complications such as nephropathy, neuropathy, limb loss, blindness, coronary artery disease, and death. Intensive lifestyle modification including diet, exercise and weight loss are the first line of treatment for diabetes. Linda Olson agreed to increase Ozempic to 0.5 mg SubQ weekly with no refills.  - Semaglutide,0.25 or 0.5MG /DOS, (OZEMPIC, 0.25 OR 0.5 MG/DOSE,) 2 MG/1.5ML SOPN; Inject 0.5 mg into the skin once a week.  Dispense: 1.5 mL; Refill: 0  2. Hyperlipidemia associated with type 2 diabetes mellitus ([place]) Cardiovascular risk and specific lipid/LDL  goals reviewed. We discussed several lifestyle modifications today. Linda Olson will continue her medications, and will continue to work on diet, exercise and weight loss efforts. Orders and follow up as documented in patient record.   Counseling Intensive lifestyle modifications are the first line treatment for this issue. . Dietary changes: Increase soluble fiber. Decrease simple carbohydrates. . Exercise changes: Moderate to vigorous-intensity aerobic activity 150 minutes per week if tolerated. . Lipid-lowering medications: see documented in medical record.  3. At risk for heart disease Linda Olson was given approximately 15 minutes of coronary artery disease prevention counseling today. She is 60 y.o. female and has risk factors for heart disease including obesity. We discussed intensive lifestyle modifications today with an emphasis on specific weight loss instructions and strategies.   Repetitive spaced learning was employed today to elicit superior memory formation and behavioral change.  4. Class 3 severe obesity with serious comorbidity and body mass index (BMI) of 40.0 to 44.9 in adult, unspecified obesity type ([place]) Linda Olson is currently in the action stage of change. As such, her goal is to continue with weight loss efforts. She has agreed to the Category 2 Plan.   Exercise goals: No exercise has been prescribed at this time.  Behavioral modification strategies: meal planning and cooking strategies and planning for success.  Linda Olson has agreed to follow-up with our clinic in 2 weeks. She was informed of the importance of frequent follow-up visits to maximize her success with intensive lifestyle modifications for her multiple health conditions.   Objective:   Blood pressure (!) 144/84, pulse 74, temperature 98.5 F (36.9 C), height 5' (1.524 m), weight 228 lb (103.4 kg), SpO2 98 %.  Body mass index is 44.53 kg/m.  General: Cooperative, alert, well developed, in no acute  distress. HEENT: Conjunctivae and lids unremarkable. Cardiovascular: Regular rhythm.  Lungs: Normal work of breathing. Neurologic: No focal deficits.   Lab Results  Component Value Date   CREATININE 0.76 04/29/2020   BUN 12 04/29/2020   NA 138 04/29/2020   K 5.0 04/29/2020   CL 100 04/29/2020   CO2 24 04/29/2020   Lab Results  Component Value Date   ALT 29 04/29/2020   AST 24 04/29/2020   ALKPHOS 130 (H) 04/29/2020   BILITOT 0.3 04/29/2020   Lab Results  Component Value Date   HGBA1C 8.2 (H) 04/29/2020   HGBA1C 6.2 (H) 03/19/2019   Lab Results  Component Value Date   INSULIN 22.1 04/29/2020   Lab Results  Component Value Date   TSH 1.210 04/29/2020   Lab Results  Component Value Date   CHOL 231 (H) 04/29/2020   HDL 59 04/29/2020   LDLCALC 157 (H) 04/29/2020   TRIG 84 04/29/2020   CHOLHDL 3.9 04/29/2020   Lab Results  Component Value Date   WBC 5.4 04/29/2020   HGB 12.8 04/29/2020   HCT 38.3 04/29/2020   MCV 90 04/29/2020   PLT 327 04/29/2020   Lab Results  Component Value Date   IRON 69 04/29/2020   TIBC 311 04/29/2020   FERRITIN 100 04/29/2020   Attestation Statements:   Reviewed by clinician on day of visit: allergies, medications, problem list, medical history, surgical history, family history, social history, and previous encounter notes.   Wilhemena Durie, am acting as transcriptionist for [employer], PA-C.  I have reviewed the above documentation for accuracy and completeness, and I agree with the above. -  *Abby Potash, PA-C

## 2020-06-10 ENCOUNTER — Other Ambulatory Visit: Payer: Self-pay | Admitting: Family Medicine

## 2020-06-10 DIAGNOSIS — Z1231 Encounter for screening mammogram for malignant neoplasm of breast: Secondary | ICD-10-CM

## 2020-06-13 DIAGNOSIS — H40023 Open angle with borderline findings, high risk, bilateral: Secondary | ICD-10-CM | POA: Diagnosis not present

## 2020-06-30 ENCOUNTER — Ambulatory Visit (INDEPENDENT_AMBULATORY_CARE_PROVIDER_SITE_OTHER): Payer: BC Managed Care – PPO | Admitting: Family Medicine

## 2020-07-12 ENCOUNTER — Other Ambulatory Visit: Payer: Self-pay

## 2020-07-12 ENCOUNTER — Ambulatory Visit (INDEPENDENT_AMBULATORY_CARE_PROVIDER_SITE_OTHER): Payer: BC Managed Care – PPO | Admitting: Physician Assistant

## 2020-07-12 ENCOUNTER — Encounter (INDEPENDENT_AMBULATORY_CARE_PROVIDER_SITE_OTHER): Payer: Self-pay | Admitting: Physician Assistant

## 2020-07-12 VITALS — BP 135/85 | HR 61 | Temp 98.0°F | Ht 60.0 in | Wt 221.0 lb

## 2020-07-12 DIAGNOSIS — Z9189 Other specified personal risk factors, not elsewhere classified: Secondary | ICD-10-CM

## 2020-07-12 DIAGNOSIS — E785 Hyperlipidemia, unspecified: Secondary | ICD-10-CM

## 2020-07-12 DIAGNOSIS — Z6841 Body Mass Index (BMI) 40.0 and over, adult: Secondary | ICD-10-CM | POA: Diagnosis not present

## 2020-07-12 DIAGNOSIS — E1169 Type 2 diabetes mellitus with other specified complication: Secondary | ICD-10-CM

## 2020-07-12 MED ORDER — OZEMPIC (0.25 OR 0.5 MG/DOSE) 2 MG/1.5ML ~~LOC~~ SOPN: 0.5000 mg | PEN_INJECTOR | SUBCUTANEOUS | 0 refills | Status: DC

## 2020-07-12 NOTE — Progress Notes (Signed)
Chief Complaint:   OBESITY Linda Olson is here to discuss her progress with her obesity treatment plan along with follow-up of her obesity related diagnoses. Linda Olson is on the Category 2 Plan and states she is following her eating plan approximately 70% of the time. Linda Olson states she is doing 0 minutes 0 times per week.  Today's visit was #: 4 Starting weight: 237 lbs Starting date: 04/28/2020 Today's weight: 221 lbs Today's date: 07/12/2020 Total lbs lost to date: 16 Total lbs lost since last in-office visit: 7  Interim History: Linda Olson is surprised by her weight loss as she splurged on chocolate cake and a milkshake. She is weighing her protein. Her hunger is controlled.  Subjective:   1. Type 2 diabetes mellitus with other specified complication, without long-term current use of insulin (HCC) Linda Olson is on Ozempic 0.5 mg, and she is tolerating it well.  2. Hyperlipidemia associated with type 2 diabetes mellitus (Linda Olson) Linda Olson is on Lipitor, and she denies myalgias or chest pain.  3. At risk for hypoglycemia Linda Olson is at increased risk for hypoglycemia due to changes in diet, diagnosis of diabetes, and/or insulin use.   Assessment/Plan:   1. Type 2 diabetes mellitus with other specified complication, without long-term current use of insulin (HCC) We will refill Ozempic for 1 month. Good blood sugar control is important to decrease the likelihood of diabetic complications such as nephropathy, neuropathy, limb loss, blindness, coronary artery disease, and death. Intensive lifestyle modification including diet, exercise and weight loss are the first line of treatment for diabetes.   - Semaglutide,0.25 or 0.5MG /DOS, (OZEMPIC, 0.25 OR 0.5 MG/DOSE,) 2 MG/1.5ML SOPN; Inject 0.5 mg into the skin once a week.  Dispense: 1.5 mL; Refill: 0  2. Hyperlipidemia associated with type 2 diabetes mellitus (Linda Olson) Cardiovascular risk and specific lipid/LDL goals reviewed.  We  discussed several lifestyle modifications today. Linda Olson will continue her medications and weight loss, and we will recheck labs at her next visit. Orders and follow up as documented in patient record.   Counseling Intensive lifestyle modifications are the first line treatment for this issue. . Dietary changes: Increase soluble fiber. Decrease simple carbohydrates. . Exercise changes: Moderate to vigorous-intensity aerobic activity 150 minutes per week if tolerated. . Lipid-lowering medications: see documented in medical record.  3. At risk for hypoglycemia Linda Olson was given approximately 15 minutes of coronary artery disease prevention counseling today. She is 60 y.o. female and has risk factors for heart disease including obesity. We discussed intensive lifestyle modifications today with an emphasis on specific weight loss instructions and strategies.   Repetitive spaced learning was employed today to elicit superior memory formation and behavioral change.  4. Class 3 severe obesity with serious comorbidity and body mass index (BMI) of 40.0 to 44.9 in adult, unspecified obesity type (Linda Olson) Linda Olson is currently in the action stage of change. As such, her goal is to continue with weight loss efforts. She has agreed to the Category 2 Plan.   We will recheck fasting labs at her next visit.  Exercise goals: No exercise has been prescribed at this time.  Behavioral modification strategies: meal planning and cooking strategies and keeping healthy foods in the home.  Linda Olson has agreed to follow-up with our clinic in 3 weeks. She was informed of the importance of frequent follow-up visits to maximize her success with intensive lifestyle modifications for her multiple health conditions.   Objective:   Blood pressure 135/85, pulse 61, temperature 98 F (36.7 C), height 5' (  1.524 m), weight 221 lb (100.2 kg), SpO2 96 %. Body mass index is 43.16 kg/m.  General: Cooperative, alert, well  developed, in no acute distress. HEENT: Conjunctivae and lids unremarkable. Cardiovascular: Regular rhythm.  Lungs: Normal work of breathing. Neurologic: No focal deficits.   Lab Results  Component Value Date   CREATININE 0.76 04/29/2020   BUN 12 04/29/2020   NA 138 04/29/2020   K 5.0 04/29/2020   CL 100 04/29/2020   CO2 24 04/29/2020   Lab Results  Component Value Date   ALT 29 04/29/2020   AST 24 04/29/2020   ALKPHOS 130 (H) 04/29/2020   BILITOT 0.3 04/29/2020   Lab Results  Component Value Date   HGBA1C 8.2 (H) 04/29/2020   HGBA1C 6.2 (H) 03/19/2019   Lab Results  Component Value Date   INSULIN 22.1 04/29/2020   Lab Results  Component Value Date   TSH 1.210 04/29/2020   Lab Results  Component Value Date   CHOL 231 (H) 04/29/2020   HDL 59 04/29/2020   LDLCALC 157 (H) 04/29/2020   TRIG 84 04/29/2020   CHOLHDL 3.9 04/29/2020   Lab Results  Component Value Date   WBC 5.4 04/29/2020   HGB 12.8 04/29/2020   HCT 38.3 04/29/2020   MCV 90 04/29/2020   PLT 327 04/29/2020   Lab Results  Component Value Date   IRON 69 04/29/2020   TIBC 311 04/29/2020   FERRITIN 100 04/29/2020   Attestation Statements:   Reviewed by clinician on day of visit: allergies, medications, problem list, medical history, surgical history, family history, social history, and previous encounter notes.   Wilhemena Durie, am acting as transcriptionist for [employer], PA-C.  I have reviewed the above documentation for accuracy and completeness, and I agree with the above. -  *Abby Potash, PA-C

## 2020-07-16 ENCOUNTER — Other Ambulatory Visit: Payer: Self-pay | Admitting: Orthopaedic Surgery

## 2020-08-02 ENCOUNTER — Other Ambulatory Visit: Payer: Self-pay

## 2020-08-02 ENCOUNTER — Ambulatory Visit: Payer: BC Managed Care – PPO

## 2020-08-02 ENCOUNTER — Ambulatory Visit
Admission: RE | Admit: 2020-08-02 | Discharge: 2020-08-02 | Disposition: A | Discharge status: Home / Self Care | Payer: BC Managed Care – PPO | Source: Ambulatory Visit | Attending: Family Medicine | Admitting: Family Medicine

## 2020-08-02 DIAGNOSIS — Z1231 Encounter for screening mammogram for malignant neoplasm of breast: Secondary | ICD-10-CM | POA: Diagnosis not present

## 2020-08-07 ENCOUNTER — Other Ambulatory Visit (INDEPENDENT_AMBULATORY_CARE_PROVIDER_SITE_OTHER): Payer: Self-pay | Admitting: Family Medicine

## 2020-08-07 DIAGNOSIS — E559 Vitamin D deficiency, unspecified: Secondary | ICD-10-CM

## 2020-08-09 ENCOUNTER — Encounter (INDEPENDENT_AMBULATORY_CARE_PROVIDER_SITE_OTHER): Payer: Self-pay | Admitting: Physician Assistant

## 2020-08-09 ENCOUNTER — Ambulatory Visit (INDEPENDENT_AMBULATORY_CARE_PROVIDER_SITE_OTHER): Payer: BC Managed Care – PPO | Admitting: Physician Assistant

## 2020-08-09 ENCOUNTER — Other Ambulatory Visit: Payer: Self-pay

## 2020-08-09 VITALS — BP 134/81 | HR 72 | Temp 98.0°F | Ht 60.0 in | Wt 220.0 lb

## 2020-08-09 DIAGNOSIS — E559 Vitamin D deficiency, unspecified: Secondary | ICD-10-CM

## 2020-08-09 DIAGNOSIS — Z9189 Other specified personal risk factors, not elsewhere classified: Secondary | ICD-10-CM | POA: Diagnosis not present

## 2020-08-09 DIAGNOSIS — Z6841 Body Mass Index (BMI) 40.0 and over, adult: Secondary | ICD-10-CM

## 2020-08-09 DIAGNOSIS — E785 Hyperlipidemia, unspecified: Secondary | ICD-10-CM

## 2020-08-09 DIAGNOSIS — E1169 Type 2 diabetes mellitus with other specified complication: Secondary | ICD-10-CM

## 2020-08-09 MED ORDER — OZEMPIC (0.25 OR 0.5 MG/DOSE) 2 MG/1.5ML ~~LOC~~ SOPN: 0.5000 mg | PEN_INJECTOR | SUBCUTANEOUS | 0 refills | Status: DC

## 2020-08-10 LAB — COMPREHENSIVE METABOLIC PANEL
ALT: 35 IU/L — ABNORMAL HIGH (ref 0–32)
AST: 24 IU/L (ref 0–40)
Albumin/Globulin Ratio: 1.3 (ref 1.2–2.2)
Albumin: 4.1 g/dL (ref 3.8–4.9)
Alkaline Phosphatase: 107 IU/L (ref 44–121)
BUN/Creatinine Ratio: 18 (ref 9–23)
BUN: 14 mg/dL (ref 6–24)
Bilirubin Total: 0.4 mg/dL (ref 0.0–1.2)
CO2: 24 mmol/L (ref 20–29)
Calcium: 9.8 mg/dL (ref 8.7–10.2)
Chloride: 102 mmol/L (ref 96–106)
Creatinine, Ser: 0.77 mg/dL (ref 0.57–1.00)
Globulin, Total: 3.1 g/dL (ref 1.5–4.5)
Glucose: 85 mg/dL (ref 65–99)
Potassium: 5 mmol/L (ref 3.5–5.2)
Sodium: 139 mmol/L (ref 134–144)
Total Protein: 7.2 g/dL (ref 6.0–8.5)
eGFR: 90 mL/min/{1.73_m2} (ref >59)

## 2020-08-10 LAB — VITAMIN D 25 HYDROXY (VIT D DEFICIENCY, FRACTURES): Vit D, 25-Hydroxy: 65.9 ng/mL (ref 30.0–100.0)

## 2020-08-10 LAB — HEMOGLOBIN A1C
Est. average glucose Bld gHb Est-mCnc: 126 mg/dL
Hgb A1c MFr Bld: 6 % — ABNORMAL HIGH (ref 4.8–5.6)

## 2020-08-10 LAB — LIPID PANEL
Chol/HDL Ratio: 3.8 ratio (ref 0.0–4.4)
Cholesterol, Total: 193 mg/dL (ref 100–199)
HDL: 51 mg/dL (ref >39)
LDL Chol Calc (NIH): 129 mg/dL — ABNORMAL HIGH (ref 0–99)
Triglycerides: 68 mg/dL (ref 0–149)
VLDL Cholesterol Cal: 13 mg/dL (ref 5–40)

## 2020-08-10 LAB — INSULIN, RANDOM: INSULIN: 16 u[IU]/mL (ref 2.6–24.9)

## 2020-08-16 ENCOUNTER — Encounter: Payer: Self-pay | Admitting: Orthopaedic Surgery

## 2020-08-16 ENCOUNTER — Ambulatory Visit (INDEPENDENT_AMBULATORY_CARE_PROVIDER_SITE_OTHER): Payer: BC Managed Care – PPO | Admitting: Orthopaedic Surgery

## 2020-08-16 VITALS — BP 119/77 | HR 66 | Ht 60.0 in | Wt 220.0 lb

## 2020-08-16 DIAGNOSIS — Z6841 Body Mass Index (BMI) 40.0 and over, adult: Secondary | ICD-10-CM

## 2020-08-16 DIAGNOSIS — Z981 Arthrodesis status: Secondary | ICD-10-CM

## 2020-08-16 NOTE — Progress Notes (Signed)
Office Visit Note   Patient: Linda Olson           Date of Birth: March 12, 1960           MRN: [id number] Visit Date: 08/16/2020              Requested by: [name], [place],  [place] [id number] PCP: [name], MD   Assessment & Plan: Visit Diagnoses:  1. History of lumbar fusion   2. BMI 40.0-44.9, adult [facility])     Plan: We reviewed her last MRI scan.  She does have some mild narrowing but no severe central stenosis.  There is facet arthropathy and she is getting improvement in her symptoms with weight loss.  Recheck 3 months.  We will hold off on epidural at this time.  Follow-Up Instructions: Return in about 3 months (around 11/16/2020).   Orders:  No orders of the defined types were placed in this encounter.  No orders of the defined types were placed in this encounter.     Procedures: No procedures performed   Clinical Data: No additional findings.   Subjective: Chief Complaint  Patient presents with   Lower Back - Pain    HPI 60 year old female returns she had single level lumbar fusion 10 years ago.  She is lost 17 pounds with Dr. Leafy Ro and her medications have been switched for diabetes and she is currently on Ozempic and states her A1c is 6.0.  She is happy with the follow-up with Dr. Leafy Ro and she states she is noticed some improvement in her back symptoms.  Patient states last injection gave her good improvement lasted for several months.  Sometimes she has discomfort when she rolls over in bed.  She states her pain is better her mobility is improved.  Review of Systems all systems noncontributory to HPI.   Objective: Vital Signs: BP 119/77   Pulse 66   Ht 5' (1.524 m)   Wt 220 lb (99.8 kg)   BMI 42.97 kg/m   Physical Exam Constitutional:      Appearance: She is well-developed.  HENT:     Head: Normocephalic.     Right Ear: External ear normal.     Left Ear: External ear normal. There is no impacted cerumen.   Eyes:     Pupils: Pupils are equal, round, and reactive to light.  Neck:     Thyroid: No thyromegaly.     Trachea: No tracheal deviation.  Cardiovascular:     Rate and Rhythm: Normal rate.  Pulmonary:     Effort: Pulmonary effort is normal.  Abdominal:     Palpations: Abdomen is soft.  Musculoskeletal:     Cervical back: No rigidity.  Skin:    General: Skin is warm and dry.  Neurological:     Mental Status: She is alert and oriented to person, place, and time.  Psychiatric:        Behavior: Behavior normal.    Ortho Exam patient is able to get from sitting to standing.  Heel toe gait is normal.  Well-healed lumbar incision.  Anterior tib EHL is intact.  No atrophy lower extremities.  Specialty Comments:  No specialty comments available.  Imaging: No results found.   PMFS History: Patient Active Problem List   Diagnosis Date Noted   BMI 40.0-44.9, adult ([place])    Hypertension    Bell's palsy 02/11/2020   Lumbar contusion 11/04/2019   History of lumbar fusion 10/06/2019  Fall 10/06/2019   Contusion of right shoulder 10/06/2019   Chest pain 03/19/2019   HTN (hypertension) 03/19/2019   HLD (hyperlipidemia) 03/19/2019   Type 2 diabetes mellitus ([place]) 03/19/2019   Pre-diabetes 11/12/2017   Past Medical History:  Diagnosis Date   Asthma    Back pain    Bell's palsy    BMI 40.0-44.9, adult (HCC)    Chest pain    Chewing difficulty    Constipation    GERD (gastroesophageal reflux disease)    Hyperlipidemia    Hypertension    Low vitamin D level    per notes from [place]   Obesity    per notes from [place]   Orbital floor fracture [facility])    [facility]; per notes from [place]   Prediabetes    per notes from [place]   Preeclampsia    per notes from [place]    Family History  Problem Relation Age of Onset   High blood pressure Sister    Stroke Maternal Aunt    Hypertension Mother    Hypertension Father     Hyperlipidemia Father    Breast cancer Neg Hx     Past Surgical History:  Procedure Laterality Date   BACK SURGERY     COLONOSCOPY     per notes from [place] ARTHROSCOPY     VAGINAL HYSTERECTOMY     Social History   Occupational History   Not on file  Tobacco Use   Smoking status: Never   Smokeless tobacco: Never  Substance and Sexual Activity   Alcohol use: No   Drug use: No   Sexual activity: Not on file

## 2020-08-17 NOTE — Progress Notes (Signed)
Chief Complaint:   OBESITY Linda Olson is here to discuss her progress with her obesity treatment plan along with follow-up of her obesity related diagnoses. Linda Olson is on the Category 2 Plan and states she is following her eating plan approximately 1% of the time. Linda Olson states she is doing 0 minutes 0 times per week.  Today's visit was #: 5 Starting weight: 237 lbs Starting date: 04/28/2020 Today's weight: 220 lbs Today's date: 08/09/2020 Total lbs lost to date: 17 Total lbs lost since last in-office visit: 1  Interim History: Linda Olson states that she has not been doing what she is supposed to be doing. She has been at [employer] and she had celebration occasions. She also indulged in fried chicken from [employer].  Subjective:   1. Type 2 diabetes mellitus with other specified complication, without long-term current use of insulin (Linda Olson) Linda Olson is on Ozempic.  2. Linda Olson Linda Olson is on Vit D weekly, and she is tolerating it well.  3. Hyperlipidemia associated with type 2 diabetes mellitus (Linda Olson) Linda Olson is on Crestor, and she denies myalgias.  4. At risk for heart disease Linda Olson is at a higher than average risk for cardiovascular disease due to obesity.   Assessment/Plan:   1. Type 2 diabetes mellitus with other specified complication, without long-term current use of insulin (Linda Olson) We will check labs today, and we will refill Ozempic for 1 month. Good blood sugar control is important to decrease the likelihood of diabetic complications such as nephropathy, neuropathy, limb loss, blindness, coronary artery disease, and death. Intensive lifestyle modification including diet, exercise and weight loss are the first line of treatment for diabetes.   - Semaglutide,0.25 or 0.5MG /DOS, (OZEMPIC, 0.25 OR 0.5 MG/DOSE,) 2 MG/1.5ML SOPN; Inject 0.5 mg into the skin once a week.  Dispense: 1.5 mL; Refill: 0 - Comprehensive metabolic panel - Hemoglobin A1c - Insulin,  random  2. Linda Olson Low Linda D level contributes to fatigue and are associated with obesity, breast, and colon cancer. We will check labs today. Linda Olson will continue taking prescription Linda D 50,000 IU every week and will follow-up for routine testing of Linda D, at least 2-3 times per year to avoid over-replacement.  - Linda D 25 Hydroxy (Vit-D Olson, Linda Olson)  3. Hyperlipidemia associated with type 2 diabetes mellitus (Linda Olson) Cardiovascular risk and specific lipid/LDL goals reviewed. We discussed several lifestyle modifications today. We will check labs today. Linda Olson will continue Crestor, and will continue to work on diet, exercise and weight loss efforts. Orders and follow up as documented in patient record.   - Lipid panel  4. At risk for heart disease Linda Olson was given approximately 15 minutes of coronary artery disease prevention counseling today. She is 60 y.o. female and has risk factors for heart disease including obesity. We discussed intensive lifestyle modifications today with an emphasis on specific weight loss instructions and strategies.   Linda Olson was employed today to elicit superior memory formation and behavioral change.  5. Class 3 severe obesity with serious comorbidity and body mass index (BMI) of 45.0 to 49.9 in adult, unspecified obesity type (Linda Olson) Linda Olson is currently in the action stage of change. As such, her goal is to continue with weight loss efforts. She has agreed to the Category 2 Plan.   Exercise goals: No exercise has been prescribed at this time.  Behavioral modification strategies: meal planning and cooking strategies and celebration eating strategies.  Linda Olson has agreed to follow-up with our clinic  in 3 weeks. She was informed of the importance of frequent follow-up visits to maximize her success with intensive lifestyle modifications for her multiple health conditions.   Objective:   Blood  pressure 134/81, pulse 72, temperature 98 F (36.7 C), height 5' (1.524 m), weight 220 lb (99.8 kg), SpO2 98 %. Body mass index is 42.97 kg/m.  General: Cooperative, alert, well developed, in no acute distress. HEENT: Conjunctivae and lids unremarkable. Cardiovascular: Regular rhythm.  Lungs: Normal work of breathing. Neurologic: No focal deficits.   Lab Results  Component Value Date   CREATININE 0.77 08/09/2020   BUN 14 08/09/2020   NA 139 08/09/2020   K 5.0 08/09/2020   CL 102 08/09/2020   CO2 24 08/09/2020   Lab Results  Component Value Date   ALT 35 (H) 08/09/2020   AST 24 08/09/2020   ALKPHOS 107 08/09/2020   BILITOT 0.4 08/09/2020   Lab Results  Component Value Date   HGBA1C 6.0 (H) 08/09/2020   HGBA1C 8.2 (H) 04/29/2020   HGBA1C 6.2 (H) 03/19/2019   Lab Results  Component Value Date   INSULIN 16.0 08/09/2020   INSULIN 22.1 04/29/2020   Lab Results  Component Value Date   TSH 1.210 04/29/2020   Lab Results  Component Value Date   CHOL 193 08/09/2020   HDL 51 08/09/2020   LDLCALC 129 (H) 08/09/2020   TRIG 68 08/09/2020   CHOLHDL 3.8 08/09/2020   Lab Results  Component Value Date   WBC 5.4 04/29/2020   HGB 12.8 04/29/2020   HCT 38.3 04/29/2020   MCV 90 04/29/2020   PLT 327 04/29/2020   Lab Results  Component Value Date   IRON 69 04/29/2020   TIBC 311 04/29/2020   FERRITIN 100 04/29/2020   Attestation Statements:   Reviewed by clinician on day of visit: allergies, medications, problem list, medical history, surgical history, family history, social history, and previous encounter notes.   Wilhemena Durie, am acting as transcriptionist for [employer], PA-C.  I have reviewed the above documentation for accuracy and completeness, and I agree with the above. Abby Potash, PA-C

## 2020-08-23 ENCOUNTER — Other Ambulatory Visit (INDEPENDENT_AMBULATORY_CARE_PROVIDER_SITE_OTHER): Payer: Self-pay | Admitting: Family Medicine

## 2020-08-23 DIAGNOSIS — E559 Vitamin D deficiency, unspecified: Secondary | ICD-10-CM

## 2020-08-23 NOTE — Telephone Encounter (Signed)
DR Wallace 

## 2020-08-24 NOTE — Telephone Encounter (Signed)
Last seen by Tracey 

## 2020-08-24 NOTE — Telephone Encounter (Signed)
Ok to refill 

## 2020-09-01 ENCOUNTER — Other Ambulatory Visit: Payer: Self-pay

## 2020-09-01 ENCOUNTER — Encounter (INDEPENDENT_AMBULATORY_CARE_PROVIDER_SITE_OTHER): Payer: Self-pay | Admitting: Physician Assistant

## 2020-09-01 ENCOUNTER — Ambulatory Visit (INDEPENDENT_AMBULATORY_CARE_PROVIDER_SITE_OTHER): Payer: BC Managed Care – PPO | Admitting: Physician Assistant

## 2020-09-01 VITALS — BP 136/81 | HR 65 | Temp 97.7°F | Ht 60.0 in | Wt 221.0 lb

## 2020-09-01 DIAGNOSIS — E1169 Type 2 diabetes mellitus with other specified complication: Secondary | ICD-10-CM

## 2020-09-01 DIAGNOSIS — Z9189 Other specified personal risk factors, not elsewhere classified: Secondary | ICD-10-CM | POA: Diagnosis not present

## 2020-09-01 DIAGNOSIS — E559 Vitamin D deficiency, unspecified: Secondary | ICD-10-CM | POA: Diagnosis not present

## 2020-09-01 DIAGNOSIS — Z6841 Body Mass Index (BMI) 40.0 and over, adult: Secondary | ICD-10-CM | POA: Diagnosis not present

## 2020-09-01 MED ORDER — VITAMIN D3 125 MCG (5000 UT) PO CAPS: 5000.0000 [IU] | ORAL_CAPSULE | Freq: Every day | ORAL | 0 refills | Status: DC

## 2020-09-08 NOTE — Progress Notes (Signed)
Chief Complaint:   OBESITY Linda Olson is here to discuss her progress with her obesity treatment plan along with follow-up of her obesity related diagnoses. Linda Olson is on the Category 2 Plan and states she is following her eating plan approximately 75% of the time. Linda Olson states she is walking 1 mile 1 time per week.    Today's visit was #: 6 Starting weight: 237 lbs Starting date: 04/28/2020 Today's weight: 221 lbs Today's date: 09/01/2020 Total lbs lost to date: 16 Total lbs lost since last in-office visit: 0  Interim History: Linda Olson states that she ate regular rice, and she has been weighing her meat prior to cooking it.  Subjective:   1. Vitamin D deficiency Linda Olson's last Vit D level was 65.9. She is on Vit D weekly. I discussed labs with the patient today.  2. Type 2 diabetes mellitus with other specified complication, without long-term current use of insulin (HCC) Linda Olson's last A1c was 6.0, which has improved from 8.2. She is on Ozempic 0.5 mg and finds herself hungry between meals. I discussed labs with the patient today.  3. At risk for hypoglycemia Linda Olson is at increased risk for hypoglycemia due to changes in diet, diagnosis of diabetes, and/or insulin use.   Assessment/Plan:   1. Vitamin D deficiency Low Vitamin D level contributes to fatigue and are associated with obesity, breast, and colon cancer. Linda Olson agreed to start OTC Vit D 5,000 IU daily, and she will discontinue prescription Vitamin D 50,000 IU every week. Linda Olson will follow-up for routine testing of Vitamin D, at least 2-3 times per year to avoid over-replacement.  - Cholecalciferol (VITAMIN D3) 125 MCG (5000 UT) CAPS; Take 1 capsule (5,000 Units total) by mouth daily.  Dispense: 30 capsule; Refill: 0  2. Type 2 diabetes mellitus with other specified complication, without long-term current use of insulin (HCC) Linda Olson will continue Ozempic and her meal plan. Good blood sugar  control is important to decrease the likelihood of diabetic complications such as nephropathy, neuropathy, limb loss, blindness, coronary artery disease, and death. Intensive lifestyle modification including diet, exercise and weight loss are the first line of treatment for diabetes.   3. At risk for hypoglycemia Linda Olson was given approximately 15 minutes of counseling today regarding prevention of hypoglycemia. She was advised of symptoms of hypoglycemia. Linda Olson was instructed to avoid skipping meals, eat regular protein rich meals and schedule low calorie snacks as needed.   Repetitive spaced learning was employed today to elicit superior memory formation and behavioral change  4. Class 3 severe obesity with serious comorbidity and body mass index (BMI) of 45.0 to 49.9 in adult, unspecified obesity type ([place]) Linda Olson is currently in the action stage of change. As such, her goal is to continue with weight loss efforts. She has agreed to the Category 2 Plan.   Exercise goals: As is.  Behavioral modification strategies: meal planning and cooking strategies and keeping healthy foods in the home.  Linda Olson has agreed to follow-up with our clinic in 3 weeks. She was informed of the importance of frequent follow-up visits to maximize her success with intensive lifestyle modifications for her multiple health conditions.   Objective:   Blood pressure 136/81, pulse 65, temperature 97.7 F (36.5 C), height 5' (1.524 m), weight 221 lb (100.2 kg), SpO2 99 %. Body mass index is 43.16 kg/m.  General: Cooperative, alert, well developed, in no acute distress. HEENT: Conjunctivae and lids unremarkable. Cardiovascular: Regular rhythm.  Lungs: Normal work of breathing. Neurologic:  No focal deficits.   Lab Results  Component Value Date   CREATININE 0.77 08/09/2020   BUN 14 08/09/2020   NA 139 08/09/2020   K 5.0 08/09/2020   CL 102 08/09/2020   CO2 24 08/09/2020   Lab Results  Component  Value Date   ALT 35 (H) 08/09/2020   AST 24 08/09/2020   ALKPHOS 107 08/09/2020   BILITOT 0.4 08/09/2020   Lab Results  Component Value Date   HGBA1C 6.0 (H) 08/09/2020   HGBA1C 8.2 (H) 04/29/2020   HGBA1C 6.2 (H) 03/19/2019   Lab Results  Component Value Date   INSULIN 16.0 08/09/2020   INSULIN 22.1 04/29/2020   Lab Results  Component Value Date   TSH 1.210 04/29/2020   Lab Results  Component Value Date   CHOL 193 08/09/2020   HDL 51 08/09/2020   LDLCALC 129 (H) 08/09/2020   TRIG 68 08/09/2020   CHOLHDL 3.8 08/09/2020   Lab Results  Component Value Date   VD25OH 65.9 08/09/2020   VD25OH 21.9 (L) 04/29/2020   Lab Results  Component Value Date   WBC 5.4 04/29/2020   HGB 12.8 04/29/2020   HCT 38.3 04/29/2020   MCV 90 04/29/2020   PLT 327 04/29/2020   Lab Results  Component Value Date   IRON 69 04/29/2020   TIBC 311 04/29/2020   FERRITIN 100 04/29/2020   Attestation Statements:   Reviewed by clinician on day of visit: allergies, medications, problem list, medical history, surgical history, family history, social history, and previous encounter notes.   Wilhemena Durie, am acting as transcriptionist for [employer], PA-C.  I have reviewed the above documentation for accuracy and completeness, and I agree with the above. Abby Potash, PA-C

## 2020-09-14 ENCOUNTER — Encounter (INDEPENDENT_AMBULATORY_CARE_PROVIDER_SITE_OTHER): Payer: Self-pay

## 2020-09-14 ENCOUNTER — Other Ambulatory Visit (INDEPENDENT_AMBULATORY_CARE_PROVIDER_SITE_OTHER): Payer: Self-pay | Admitting: Physician Assistant

## 2020-09-14 DIAGNOSIS — E1169 Type 2 diabetes mellitus with other specified complication: Secondary | ICD-10-CM

## 2020-09-14 NOTE — Telephone Encounter (Signed)
Pt last seen by Tracey Aguilar, PA-C.  

## 2020-09-14 NOTE — Telephone Encounter (Signed)
Message sent to [facility] 

## 2020-09-19 ENCOUNTER — Other Ambulatory Visit (INDEPENDENT_AMBULATORY_CARE_PROVIDER_SITE_OTHER): Payer: Self-pay | Admitting: Physician Assistant

## 2020-09-19 DIAGNOSIS — E1169 Type 2 diabetes mellitus with other specified complication: Secondary | ICD-10-CM

## 2020-09-19 NOTE — Telephone Encounter (Signed)
Refill request sent to [facility]

## 2020-09-19 NOTE — Telephone Encounter (Signed)
Needs refill of Ozempic.Ok to refill?  LAST APPOINTMENT DATE: 09/14/2020  NEXT APPOINTMENT DATE: 09/22/2020   [id number] [place], [place] - [address] [place] [address] Phone: [phone] Fax: [phone]  Patient is requesting a refill of the following medications: Ozempic No prescriptions requested or ordered in this encounter   Date last filled: 08/09/20 Previously prescribed by Abby Potash  Lab Results      Component                Value               Date                      HGBA1C                   6.0 (H)             08/09/2020                HGBA1C                   8.2 (H)             04/29/2020                HGBA1C                   6.2 (H)             03/19/2019           Lab Results      Component                Value               Date                      LDLCALC                  129 (H)             08/09/2020                CREATININE               0.77                08/09/2020           Lab Results      Component                Value               Date                      VD25OH                   65.9                08/09/2020                VD25OH                   21.9 (L)            04/29/2020            BP Readings from Last 3 Encounters: 09/01/20 : 136/81 08/16/20 : 119/77 08/09/20 : 134/81

## 2020-09-19 NOTE — Telephone Encounter (Signed)
Refill request sent To [facility]

## 2020-09-20 NOTE — Telephone Encounter (Signed)
Patient checking status on her Ozempic refill request.  Thank you

## 2020-09-22 ENCOUNTER — Other Ambulatory Visit: Payer: Self-pay

## 2020-09-22 ENCOUNTER — Ambulatory Visit (INDEPENDENT_AMBULATORY_CARE_PROVIDER_SITE_OTHER): Payer: BC Managed Care – PPO | Admitting: Physician Assistant

## 2020-09-22 ENCOUNTER — Encounter (INDEPENDENT_AMBULATORY_CARE_PROVIDER_SITE_OTHER): Payer: Self-pay | Admitting: Physician Assistant

## 2020-09-22 VITALS — BP 119/77 | HR 77 | Temp 98.3°F | Ht 60.0 in | Wt 217.0 lb

## 2020-09-22 DIAGNOSIS — E1159 Type 2 diabetes mellitus with other circulatory complications: Secondary | ICD-10-CM

## 2020-09-22 DIAGNOSIS — I152 Hypertension secondary to endocrine disorders: Secondary | ICD-10-CM

## 2020-09-22 DIAGNOSIS — Z9189 Other specified personal risk factors, not elsewhere classified: Secondary | ICD-10-CM

## 2020-09-22 DIAGNOSIS — Z6841 Body Mass Index (BMI) 40.0 and over, adult: Secondary | ICD-10-CM

## 2020-09-22 DIAGNOSIS — E1169 Type 2 diabetes mellitus with other specified complication: Secondary | ICD-10-CM | POA: Diagnosis not present

## 2020-09-22 MED ORDER — SEMAGLUTIDE (1 MG/DOSE) 4 MG/3ML ~~LOC~~ SOPN: 1.0000 mg | PEN_INJECTOR | SUBCUTANEOUS | 0 refills | Status: DC

## 2020-09-28 NOTE — Progress Notes (Signed)
Chief Complaint:   OBESITY Linda Olson is here to discuss her progress with her obesity treatment plan along with follow-up of her obesity related diagnoses. Rilley is on the Category 2 Plan and states she is following her eating plan approximately 50% of the time. Jimesha states she is on the treadmill and weights for 10-20 minutes 2 times per week.  Today's visit was #: 7 Starting weight: 237 lbs Starting date: 04/28/2020 Today's weight: 217 lbs Today's date: 09/22/2020 Total lbs lost to date: 20 Total lbs lost since last in-office visit: 4  Interim History: Sharae did very well with weight loss. She traveled to [place] and made good choices with food. She has been more active.  Subjective:   1. Type 2 diabetes mellitus with other specified complication, without long-term current use of insulin (HCC) Linda Olson is on Ozempic 0.5 mg. She is feeling fuller faster but she feels that she may need an increase in her dose.  2. Hypertension associated with type 2 diabetes mellitus ([place]) Linda Olson's blood pressure is well controlled. She is on Hyzaar, and she denies chest pain.  3. At risk for heart disease Linda Olson is at a higher than average risk for cardiovascular disease due to obesity.   Assessment/Plan:   1. Type 2 diabetes mellitus with other specified complication, without long-term current use of insulin ([place]) Adine agreed to increase Ozempic to 1 mg with no refills. Good blood sugar control is important to decrease the likelihood of diabetic complications such as nephropathy, neuropathy, limb loss, blindness, coronary artery disease, and death. Intensive lifestyle modification including diet, exercise and weight loss are the first line of treatment for diabetes.   - Semaglutide, 1 MG/DOSE, 4 MG/3ML SOPN; Inject 1 mg as directed once a week.  Dispense: 3 mL; Refill: 0  2. Hypertension associated with type 2 diabetes mellitus ([place]) Marciana will continue her  medications and weight loss to improve blood pressure control. We will watch for signs of hypotension as she continues her lifestyle modifications.  3. At risk for heart disease Adriyanna was given approximately 15 minutes of coronary artery disease prevention counseling today. She is 60 y.o. female and has risk factors for heart disease including obesity. We discussed intensive lifestyle modifications today with an emphasis on specific weight loss instructions and strategies.   Repetitive spaced learning was employed today to elicit superior memory formation and behavioral change.  4. Class 3 severe obesity with serious comorbidity and body mass index (BMI) of 45.0 to 49.9 in adult, unspecified obesity type ([place]), current bmi 42.38 Linda Olson is currently in the action stage of change. As such, her goal is to continue with weight loss efforts. She has agreed to the Category 2 Plan.   Exercise goals: As is.  Behavioral modification strategies: meal planning and cooking strategies and keeping healthy foods in the home.  Kemani has agreed to follow-up with our clinic in 3 weeks. She was informed of the importance of frequent follow-up visits to maximize her success with intensive lifestyle modifications for her multiple health conditions.   Objective:   Blood pressure 119/77, pulse 77, temperature 98.3 F (36.8 C), height 5' (1.524 m), weight 217 lb (98.4 kg), SpO2 99 %. Body mass index is 42.38 kg/m.  General: Cooperative, alert, well developed, in no acute distress. HEENT: Conjunctivae and lids unremarkable. Cardiovascular: Regular rhythm.  Lungs: Normal work of breathing. Neurologic: No focal deficits.   Lab Results  Component Value Date   CREATININE 0.77 08/09/2020  BUN 14 08/09/2020   NA 139 08/09/2020   K 5.0 08/09/2020   CL 102 08/09/2020   CO2 24 08/09/2020   Lab Results  Component Value Date   ALT 35 (H) 08/09/2020   AST 24 08/09/2020   ALKPHOS 107 08/09/2020    BILITOT 0.4 08/09/2020   Lab Results  Component Value Date   HGBA1C 6.0 (H) 08/09/2020   HGBA1C 8.2 (H) 04/29/2020   HGBA1C 6.2 (H) 03/19/2019   Lab Results  Component Value Date   INSULIN 16.0 08/09/2020   INSULIN 22.1 04/29/2020   Lab Results  Component Value Date   TSH 1.210 04/29/2020   Lab Results  Component Value Date   CHOL 193 08/09/2020   HDL 51 08/09/2020   LDLCALC 129 (H) 08/09/2020   TRIG 68 08/09/2020   CHOLHDL 3.8 08/09/2020   Lab Results  Component Value Date   VD25OH 65.9 08/09/2020   VD25OH 21.9 (L) 04/29/2020   Lab Results  Component Value Date   WBC 5.4 04/29/2020   HGB 12.8 04/29/2020   HCT 38.3 04/29/2020   MCV 90 04/29/2020   PLT 327 04/29/2020   Lab Results  Component Value Date   IRON 69 04/29/2020   TIBC 311 04/29/2020   FERRITIN 100 04/29/2020   Attestation Statements:   Reviewed by clinician on day of visit: allergies, medications, problem list, medical history, surgical history, family history, social history, and previous encounter notes.   Wilhemena Durie, am acting as transcriptionist for [employer], PA-C.  I have reviewed the above documentation for accuracy and completeness, and I agree with the above. Abby Potash, PA-C

## 2020-10-04 DIAGNOSIS — Z0000 Encounter for general adult medical examination without abnormal findings: Secondary | ICD-10-CM | POA: Diagnosis not present

## 2020-10-09 ENCOUNTER — Other Ambulatory Visit: Payer: Self-pay | Admitting: Orthopaedic Surgery

## 2020-10-10 NOTE — Telephone Encounter (Signed)
Please advise 

## 2020-10-11 ENCOUNTER — Other Ambulatory Visit: Payer: Self-pay | Admitting: Family Medicine

## 2020-10-11 ENCOUNTER — Encounter (INDEPENDENT_AMBULATORY_CARE_PROVIDER_SITE_OTHER): Payer: Self-pay

## 2020-10-11 DIAGNOSIS — I712 Thoracic aortic aneurysm, without rupture, unspecified: Secondary | ICD-10-CM

## 2020-10-17 DIAGNOSIS — H40023 Open angle with borderline findings, high risk, bilateral: Secondary | ICD-10-CM | POA: Diagnosis not present

## 2020-10-18 ENCOUNTER — Ambulatory Visit (INDEPENDENT_AMBULATORY_CARE_PROVIDER_SITE_OTHER): Payer: BC Managed Care – PPO | Admitting: Physician Assistant

## 2020-10-20 ENCOUNTER — Other Ambulatory Visit (INDEPENDENT_AMBULATORY_CARE_PROVIDER_SITE_OTHER): Payer: Self-pay | Admitting: Physician Assistant

## 2020-10-20 DIAGNOSIS — E1169 Type 2 diabetes mellitus with other specified complication: Secondary | ICD-10-CM

## 2020-10-24 NOTE — Telephone Encounter (Signed)
LAST APPOINTMENT DATE: 09/22/2020 NEXT APPOINTMENT DATE: 11/16/2020 Last appt rescheduled due to provider out of office   [id number] [id number] [id number]: [phone]: [phone] Patient is requesting a refill of the following medications: Requested Prescriptions   Pending Prescriptions Disp Refills   OZEMPIC, 1 MG/DOSE, 4 MG/3ML SOPN [Pharmacy Med Name: OZEMPIC 1 MG/DOSE (4 MG/3 ML)] 3 mL 0    Sig: INJECT 1 MG AS DIRECTED ONCE A WEEK.    Date last filled: 09/22/2020 Previously prescribed by [facility] Lab Results  Component Value Date   HGBA1C 6.0 (H) 08/09/2020   HGBA1C 8.2 (H) 04/29/2020   HGBA1C 6.2 (H) 03/19/2019   Lab Results  Component Value Date   LDLCALC 129 (H) 08/09/2020   CREATININE 0.77 08/09/2020   Lab Results  Component Value Date   VD25OH 65.9 08/09/2020   VD25OH 21.9 (L) 04/29/2020    BP Readings from Last 3 Encounters:  09/22/20 119/77  09/01/20 136/81  08/16/20 119/77

## 2020-10-24 NOTE — Telephone Encounter (Signed)
Pt last seen by Tracey Aguilar, PA-C.  

## 2020-10-29 ENCOUNTER — Other Ambulatory Visit: Payer: Self-pay

## 2020-10-29 ENCOUNTER — Ambulatory Visit
Admission: RE | Admit: 2020-10-29 | Discharge: 2020-10-29 | Disposition: A | Discharge status: Home / Self Care | Payer: BC Managed Care – PPO | Source: Ambulatory Visit | Attending: Family Medicine | Admitting: Family Medicine

## 2020-10-29 DIAGNOSIS — I712 Thoracic aortic aneurysm, without rupture, unspecified: Secondary | ICD-10-CM

## 2020-10-29 DIAGNOSIS — J9859 Other diseases of mediastinum, not elsewhere classified: Secondary | ICD-10-CM | POA: Diagnosis not present

## 2020-10-29 MED ORDER — GADOBENATE DIMEGLUMINE 529 MG/ML IV SOLN
20.0000 mL | Freq: Once | INTRAVENOUS | Status: AC | PRN
  Administered 2020-10-29: 20 mL via INTRAVENOUS

## 2020-11-01 ENCOUNTER — Other Ambulatory Visit: Payer: Self-pay | Admitting: Family Medicine

## 2020-11-01 DIAGNOSIS — R9389 Abnormal findings on diagnostic imaging of other specified body structures: Secondary | ICD-10-CM

## 2020-11-15 ENCOUNTER — Ambulatory Visit
Admission: RE | Admit: 2020-11-15 | Discharge: 2020-11-15 | Disposition: A | Discharge status: Home / Self Care | Payer: BC Managed Care – PPO | Source: Ambulatory Visit | Attending: Family Medicine | Admitting: Family Medicine

## 2020-11-15 DIAGNOSIS — E041 Nontoxic single thyroid nodule: Secondary | ICD-10-CM | POA: Diagnosis not present

## 2020-11-15 DIAGNOSIS — R9389 Abnormal findings on diagnostic imaging of other specified body structures: Secondary | ICD-10-CM

## 2020-11-16 ENCOUNTER — Ambulatory Visit (INDEPENDENT_AMBULATORY_CARE_PROVIDER_SITE_OTHER): Payer: BC Managed Care – PPO | Admitting: Orthopaedic Surgery

## 2020-11-16 ENCOUNTER — Other Ambulatory Visit: Payer: Self-pay

## 2020-11-16 ENCOUNTER — Ambulatory Visit (INDEPENDENT_AMBULATORY_CARE_PROVIDER_SITE_OTHER): Payer: BC Managed Care – PPO | Admitting: Family Medicine

## 2020-11-16 ENCOUNTER — Encounter: Payer: Self-pay | Admitting: Orthopaedic Surgery

## 2020-11-16 ENCOUNTER — Encounter (INDEPENDENT_AMBULATORY_CARE_PROVIDER_SITE_OTHER): Payer: Self-pay | Admitting: Family Medicine

## 2020-11-16 VITALS — BP 149/85 | HR 67 | Temp 98.0°F | Ht 60.0 in | Wt 216.0 lb

## 2020-11-16 VITALS — BP 141/89 | HR 76 | Ht 60.0 in | Wt 220.0 lb

## 2020-11-16 DIAGNOSIS — M51369 Other intervertebral disc degeneration, lumbar region without mention of lumbar back pain or lower extremity pain: Secondary | ICD-10-CM | POA: Insufficient documentation

## 2020-11-16 DIAGNOSIS — E785 Hyperlipidemia, unspecified: Secondary | ICD-10-CM

## 2020-11-16 DIAGNOSIS — E66813 Obesity, class 3: Secondary | ICD-10-CM

## 2020-11-16 DIAGNOSIS — M5126 Other intervertebral disc displacement, lumbar region: Secondary | ICD-10-CM

## 2020-11-16 DIAGNOSIS — I152 Hypertension secondary to endocrine disorders: Secondary | ICD-10-CM

## 2020-11-16 DIAGNOSIS — Z6841 Body Mass Index (BMI) 40.0 and over, adult: Secondary | ICD-10-CM

## 2020-11-16 DIAGNOSIS — Z981 Arthrodesis status: Secondary | ICD-10-CM

## 2020-11-16 DIAGNOSIS — E1169 Type 2 diabetes mellitus with other specified complication: Secondary | ICD-10-CM | POA: Diagnosis not present

## 2020-11-16 DIAGNOSIS — E1159 Type 2 diabetes mellitus with other circulatory complications: Secondary | ICD-10-CM | POA: Diagnosis not present

## 2020-11-16 DIAGNOSIS — M5136 Other intervertebral disc degeneration, lumbar region: Secondary | ICD-10-CM | POA: Insufficient documentation

## 2020-11-16 DIAGNOSIS — Z9189 Other specified personal risk factors, not elsewhere classified: Secondary | ICD-10-CM

## 2020-11-16 MED ORDER — TIRZEPATIDE 5 MG/0.5ML ~~LOC~~ SOAJ: 5.0000 mg | SUBCUTANEOUS | 0 refills | Status: DC

## 2020-11-16 NOTE — Progress Notes (Signed)
Office Visit Note   Patient: Linda Olson           Date of Birth: 14-Oct-1960           MRN: [id number] Visit Date: 11/16/2020              Requested by: [name], [place],  [place] [address] PCP: [name], MD   Assessment & Plan: Visit Diagnoses:  1. History of lumbar fusion   2. Bulge of lumbar disc without myelopathy     Plan: We reviewed her MRI scan 2021 as well as plain radiographs with her again today.  She got great relief for many months with the epidural.  We will order a repeat epidural.  Continue weight loss with weight loss clinic.  She can follow-up with me as needed.  Follow-Up Instructions: No follow-ups on file.   Orders:  No orders of the defined types were placed in this encounter.  No orders of the defined types were placed in this encounter.     Procedures: No procedures performed   Clinical Data: No additional findings.   Subjective: Chief Complaint  Patient presents with   Lower Back - Follow-up    HPI 60 year old female returns last seen in June.  Previous lumbar fusion L5-S1.  She had an epidural in December got great relief states her pain is gradually getting worse and is requesting a repeat epidural with Dr. Ernestina Patches.  She is currently taking ibuprofen 800 mg p.o. twice daily.  She is going to the weight loss clinic states she is at 3 today and has gradually lost some weight and she is encouraged to continue.  Review of Systems all the systems update noncontributory.   Objective: Vital Signs: BP (!) 141/89   Pulse 76   Ht 5' (1.524 m)   Wt 220 lb (99.8 kg)   BMI 42.97 kg/m   Physical Exam Constitutional:      Appearance: She is well-developed.  HENT:     Head: Normocephalic.     Right Ear: External ear normal.     Left Ear: External ear normal. There is no impacted cerumen.  Eyes:     Pupils: Pupils are equal, round, and reactive to light.  Neck:     Thyroid: No thyromegaly.     Trachea:  No tracheal deviation.  Cardiovascular:     Rate and Rhythm: Normal rate.  Pulmonary:     Effort: Pulmonary effort is normal.  Abdominal:     Palpations: Abdomen is soft.  Musculoskeletal:     Cervical back: No rigidity.  Skin:    General: Skin is warm and dry.  Neurological:     Mental Status: She is alert and oriented to person, place, and time.  Psychiatric:        Behavior: Behavior normal.    Ortho Exam well-healed lumbar incision.  Negative straight leg raising.  Discomfort with lateral bending forward flexion extension.  Anterior tib gastrocsoleus is intact distal pulses are intact.  Specialty Comments:  No specialty comments available.  Imaging: US THYROID  Result Date: 11/15/2020 CLINICAL DATA:  Abnormal magnetic resonance angiography (MRA) of chest. DX THYROID NODULE SEEN ON MRA CHEST EXAM: THYROID ULTRASOUND TECHNIQUE: Ultrasound examination of the thyroid gland and adjacent soft tissues was performed. COMPARISON:  None. FINDINGS: Parenchymal Echotexture: Mildly heterogenous Isthmus: 0.4 cm Right lobe: 5.7 x 1.9 x 2.7 cm Left lobe: 5.4 x 1.7 x 2.5 cm _________________________________________________________ Estimated total number  of nodules >/= 1 cm: 2 Number of spongiform nodules >/=  2 cm not described below (TR1): 0 Number of mixed cystic and solid nodules >/= 1.5 cm not described below ([place]): 0 _________________________________________________________ Nodule # 1: Location: Right; Inferior Maximum size: 2.1 cm; Other 2 dimensions: 1.9 x 1.5 cm Composition: mixed cystic and solid (1) Echogenicity: hypoechoic (2) Shape: not taller-than-wide (0) Margins: smooth (0) Echogenic foci: macrocalcifications (1) ACR TI-RADS total points: 4. ACR TI-RADS risk category: TR4 (4-6 points). ACR TI-RADS recommendations: **Given size (>/= 1.5 cm) and appearance, fine needle aspiration of this moderately suspicious nodule should be considered based on TI-RADS criteria.  _________________________________________________________ Nodule # 2: Location: Left; Mid Maximum size: 2.5 cm; Other 2 dimensions: 2.3 x 2.1 cm Composition: solid/almost completely solid (2) Echogenicity: hypoechoic (2) Shape: not taller-than-wide (0) Margins: smooth (0) Echogenic foci: none (0) ACR TI-RADS total points: 4. ACR TI-RADS risk category: TR4 (4-6 points). ACR TI-RADS recommendations: **Given size (>/= 1.5 cm) and appearance, fine needle aspiration of this moderately suspicious nodule should be considered based on TI-RADS criteria. _________________________________________________________ IMPRESSION: 1. Multinodular thyroid gland. 2. Nodule labeled 1 in the right thyroid lobe meets criteria for biopsy. 3. Nodule labeled 2 in the left thyroid lobe meets criteria for biopsy. The above is in keeping with the ACR TI-RADS recommendations - J Am Coll Radiol 2017;14:587-595. Electronically Signed   By: Albin Felling M.D.   On: 11/15/2020 16:27   CLINICAL DATA:  History of lumbar fusion. Low back pain, unspecified back pain laterality, unspecified chronicity, unspecified whether sciatica present. Low back pain, prior surgery, new symptoms; history of L5-S1 fusion, fall 09/07/2019 with low back and left buttock pain. Additional history provided by scanning technologist: Patient reports 4 months of low back pain a central and radiates into both legs, weakness, previous lumbar fusion 2011.   EXAM: MRI LUMBAR SPINE WITHOUT CONTRAST   TECHNIQUE: Multiplanar, multisequence MR imaging of the lumbar spine was performed. No intravenous contrast was administered.   COMPARISON:  Lumbar spine radiographs 10/06/2019. Lumbar spine MRI 02/09/2010.   FINDINGS: Segmentation: 5 lumbar vertebrae (correlating with prior lumbar spine radiographs).   Alignment:  Mild L5-S1 fused grade 1 anterolisthesis.   Vertebrae: Prior L5-S1 posterior decompression and posterior spinal fusion. Susceptibility artifact  arising from the spinal fusion construct limits evaluation of marrow signal at this level. Vertebral body height is maintained. Partially imaged nonspecific edema within the right iliac bone (series 4, image 1). No significant marrow edema or suspicious osseous lesion identified elsewhere.   Conus medullaris and cauda equina: Conus extends to the L1 level. No signal abnormality within the visualized distal spinal cord.   Paraspinal and other soft tissues: Incompletely assessed left renal cysts. Atrophy of the lumbar paraspinal musculature. Postsurgical changes to the dorsal soft tissues overlying the lower lumbar spine.   Disc levels:   Unless otherwise stated, the level by level findings below have not significantly changed since prior MRI 02/10/2010.   No more than mild disc degeneration at the non fused levels.   T12-L1: Small disc bulge. Mild facet arthrosis. No significant spinal canal or foraminal narrowing.   L1-L2: Mild facet arthrosis. No significant disc herniation or stenosis.   L2-L3: Minimal disc bulge. Mild facet arthrosis/ligamentum flavum hypertrophy. Mild left subarticular narrowing without nerve root impingement. Central canal patent. Mild bilateral neural foraminal narrowing.   L3-L4: Disc bulge. Superimposed shallow broad-based central disc protrusion. Moderate facet arthrosis. Mild bilateral subarticular narrowing with slight crowding of the descending L4 nerve  roots (series 6, image 20). Mild relative narrowing of the central canal. Mild bilateral neural foraminal narrowing.   L4-L5: Post laminectomy changes, new from the prior examination. Tiny left center disc protrusion, new from the prior examination (series 6, image 26). Posterior element hypertrophy. No significant spinal canal stenosis. Mild right neural foraminal narrowing.   L5-S1: Interval posterior decompression and posterior spinal fusion. Small fluid collection within the laminectomy bed  posterior to the thecal sac, measuring 1.5 x 0.4 cm in transaxial dimensions and likely reflecting a seroma (series 6, image 29). Posterior element hypertrophy. No significant spinal canal stenosis. Susceptibility artifact limits evaluation of the neural foramina. No appreciable left neural foraminal narrowing. Suspected mild right neural foraminal narrowing related to endplate spurring and posterior element hypertrophy.   IMPRESSION: Comparison is made with the prior lumbar spine MRI of 02/10/2010. Lumbar spondylosis and postsurgical changes as described. Findings are most notably as follows.   At L5-S1, there has been interval posterior decompression and posterior spinal fusion. No significant spinal canal stenosis remains. Susceptibility artifact limits evaluation of the neural foramina. Suspected mild right neural foraminal narrowing secondary to endplate spurring and posterior element hypertrophy.   At L4-L5, post laminectomy changes are new from the prior MRI. There is a new tiny left center disc protrusion without spinal canal stenosis. Posterior element hypertrophy contributes to mild right neural foraminal narrowing.   At L3-L4, there is a disc bulge. Superimposed shallow broad-based central disc protrusion. Moderate facet arthrosis. Mild bilateral subarticular narrowing with slight crowding of the descending L4 nerve roots. Mild relative narrowing of the central canal. Mild bilateral neural foraminal narrowing. These findings are unchanged.   Partially imaged nonspecific edema within the right iliac bone.     Electronically Signed   By: Kellie Simmering DO   On: 01/10/2020 08:15  PMFS History: Patient Active Problem List   Diagnosis Date Noted   Bulge of lumbar disc without myelopathy 11/16/2020   BMI 40.0-44.9, adult (HCC)    Hypertension    Bell's palsy 02/11/2020   Lumbar contusion 11/04/2019   History of lumbar fusion 10/06/2019   Fall 10/06/2019   Contusion  of right shoulder 10/06/2019   Chest pain 03/19/2019   HTN (hypertension) 03/19/2019   HLD (hyperlipidemia) 03/19/2019   Type 2 diabetes mellitus ([place]) 03/19/2019   Pre-diabetes 11/12/2017   Past Medical History:  Diagnosis Date   Asthma    Back pain    Bell's palsy    BMI 40.0-44.9, adult (HCC)    Chest pain    Chewing difficulty    Constipation    GERD (gastroesophageal reflux disease)    Hyperlipidemia    Hypertension    Low vitamin D level    per notes from [place]   Obesity    per notes from [place]   Orbital floor fracture [facility])    [facility]; per notes from [place]   Prediabetes    per notes from [place]   Preeclampsia    per notes from [place]    Family History  Problem Relation Age of Onset   High blood pressure Sister    Stroke Maternal Aunt    Hypertension Mother    Hypertension Father    Hyperlipidemia Father    Breast cancer Neg Hx     Past Surgical History:  Procedure Laterality Date   BACK SURGERY     COLONOSCOPY     per notes from [place] ARTHROSCOPY  VAGINAL HYSTERECTOMY     Social History   Occupational History   Not on file  Tobacco Use   Smoking status: Never   Smokeless tobacco: Never  Substance and Sexual Activity   Alcohol use: No   Drug use: No   Sexual activity: Not on file

## 2020-11-16 NOTE — Addendum Note (Signed)
Addended by: Meyer Cory on: 11/16/2020 08:56 AM   Modules accepted: Orders

## 2020-11-17 NOTE — Progress Notes (Signed)
Chief Complaint:   OBESITY Linda Olson is here to discuss her progress with her obesity treatment plan along with follow-up of her obesity related diagnoses.   Today's visit was #: 8 Starting weight: 237 lbs Starting date: 04/28/2020 Today's weight: 216 lbs Today's date: 11/16/2020 Weight change since last visit: 1 lb Total lbs lost to date: 21 lbs Body mass index is 42.18 kg/m.  Total weight loss percentage to date: -8.86%  Current Meal Plan: the Category 2 Plan for 50% of the time.  Current Exercise Plan: Increased walking. Current Anti-Obesity Medications: Ozempic 1 mg subcutaneously weekly. Side effects: None.  Interim History:  Linda Olson says that this is a celebration month.  She is happy that she has not gained.  Assessment/Plan:   1. Type 2 diabetes mellitus with other specified complication, without long-term current use of insulin (HCC) Diabetes Mellitus: Not at goal. Medication: Ozempic 1 mg .subuc weekly. Issues reviewed: blood sugar goals, complications of diabetes mellitus, hypoglycemia prevention and treatment, exercise, and nutrition.   Plan:  Stop Ozempic and start Mounjaro 5 mg .subuc weekly.  We will recheck labs in 3 months if not done with Endocrinology or PCP.   Lab Results  Component Value Date   HGBA1C 6.0 (H) 08/09/2020   HGBA1C 8.2 (H) 04/29/2020   HGBA1C 6.2 (H) 03/19/2019   Lab Results  Component Value Date   LDLCALC 129 (H) 08/09/2020   CREATININE 0.77 08/09/2020   - Start tirzepatide [facility]) 5 MG/0.5ML Pen; Inject 5 mg into the skin once a week.  Dispense: 6 mL; Refill: 0  2. Hypertension associated with type 2 diabetes mellitus (HCC) Elevated today. Medications: Hyzaar 50-12.5 mg daily.   Plan: Avoid buying foods that are: processed, frozen, or prepackaged to avoid excess salt. We will watch for signs of hypotension as she continues lifestyle modifications.  BP Readings from Last 3 Encounters:  11/16/20 (!) 149/85  11/16/20 (!)  141/89  09/22/20 119/77   Lab Results  Component Value Date   CREATININE 0.77 08/09/2020   3. Hyperlipidemia associated with type 2 diabetes mellitus ([place]) Course: Improving, but not optimized. Lipid-lowering medications: Lipitor 40 mg daily.   Plan: Dietary changes: Increase soluble fiber, decrease simple carbohydrates, decrease saturated fat. Exercise changes: Moderate to vigorous-intensity aerobic activity 150 minutes per week or as tolerated. We will continue to monitor along with PCP/specialists as it pertains to her weight loss journey.  Lab Results  Component Value Date   CHOL 193 08/09/2020   HDL 51 08/09/2020   LDLCALC 129 (H) 08/09/2020   TRIG 68 08/09/2020   CHOLHDL 3.8 08/09/2020   Lab Results  Component Value Date   ALT 35 (H) 08/09/2020   AST 24 08/09/2020   ALKPHOS 107 08/09/2020   BILITOT 0.4 08/09/2020   The 10-year ASCVD risk score (Arnett DK, et al., 2019) is: 21.8%   Values used to calculate the score:     Age: 60 years     Sex: Female     Is Non-Hispanic African American: Yes     Diabetic: Yes     Tobacco smoker: No     Systolic Blood Pressure: 123456 mmHg     Is BP treated: Yes     HDL Cholesterol: 51 mg/dL     Total Cholesterol: 193 mg/dL  4. At risk for heart disease Due to Linda Olson's current state of health and medical condition(s), she is at a higher risk for heart disease.  This puts the patient at much  greater risk to subsequently develop cardiopulmonary conditions that can significantly affect patient's quality of life in a negative manner.    At least 8 minutes were spent on counseling Linda Olson about these concerns today. Evidence-based interventions for health behavior change were utilized today including the discussion of self monitoring techniques, problem-solving barriers, and SMART goal setting techniques.  Specifically, regarding patient's less desirable eating habits and patterns, we employed the technique of small changes when Linda Olson  has not been able to fully commit to her prudent nutritional plan.  5. Obesity, current BMI 42.3  Course: Linda Olson is currently in the action stage of change. As such, her goal is to continue with weight loss efforts.   Nutrition goals: She has agreed to the Category 2 Plan.   Exercise goals:  As is.  Behavioral modification strategies: increasing lean protein intake, decreasing simple carbohydrates, increasing vegetables, and increasing water intake.  Linda Olson has agreed to follow-up with our clinic in 3 weeks. She was informed of the importance of frequent follow-up visits to maximize her success with intensive lifestyle modifications for her multiple health conditions.   Objective:   Blood pressure (!) 149/85, pulse 67, temperature 98 F (36.7 C), temperature source Oral, height 5' (1.524 m), weight 216 lb (98 kg), SpO2 99 %. Body mass index is 42.18 kg/m.  General: Cooperative, alert, well developed, in no acute distress. HEENT: Conjunctivae and lids unremarkable. Cardiovascular: Regular rhythm.  Lungs: Normal work of breathing. Neurologic: No focal deficits.   Lab Results  Component Value Date   CREATININE 0.77 08/09/2020   BUN 14 08/09/2020   NA 139 08/09/2020   K 5.0 08/09/2020   CL 102 08/09/2020   CO2 24 08/09/2020   Lab Results  Component Value Date   ALT 35 (H) 08/09/2020   AST 24 08/09/2020   ALKPHOS 107 08/09/2020   BILITOT 0.4 08/09/2020   Lab Results  Component Value Date   HGBA1C 6.0 (H) 08/09/2020   HGBA1C 8.2 (H) 04/29/2020   HGBA1C 6.2 (H) 03/19/2019   Lab Results  Component Value Date   INSULIN 16.0 08/09/2020   INSULIN 22.1 04/29/2020   Lab Results  Component Value Date   TSH 1.210 04/29/2020   Lab Results  Component Value Date   CHOL 193 08/09/2020   HDL 51 08/09/2020   LDLCALC 129 (H) 08/09/2020   TRIG 68 08/09/2020   CHOLHDL 3.8 08/09/2020   Lab Results  Component Value Date   VD25OH 65.9 08/09/2020   VD25OH 21.9 (L)  04/29/2020   Lab Results  Component Value Date   WBC 5.4 04/29/2020   HGB 12.8 04/29/2020   HCT 38.3 04/29/2020   MCV 90 04/29/2020   PLT 327 04/29/2020   Lab Results  Component Value Date   IRON 69 04/29/2020   TIBC 311 04/29/2020   FERRITIN 100 04/29/2020   Attestation Statements:   Reviewed by clinician on day of visit: allergies, medications, problem list, medical history, surgical history, family history, social history, and previous encounter notes.  I, Water quality scientist, CMA, am acting as transcriptionist for Briscoe Deutscher, DO  I have reviewed the above documentation for accuracy and completeness, and I agree with the above. Briscoe Deutscher, DO

## 2020-11-18 ENCOUNTER — Other Ambulatory Visit: Payer: Self-pay | Admitting: Family Medicine

## 2020-11-21 ENCOUNTER — Telehealth (INDEPENDENT_AMBULATORY_CARE_PROVIDER_SITE_OTHER): Payer: Self-pay | Admitting: Family Medicine

## 2020-11-21 ENCOUNTER — Encounter (INDEPENDENT_AMBULATORY_CARE_PROVIDER_SITE_OTHER): Payer: Self-pay

## 2020-11-21 ENCOUNTER — Other Ambulatory Visit: Payer: Self-pay | Admitting: Family Medicine

## 2020-11-21 DIAGNOSIS — E041 Nontoxic single thyroid nodule: Secondary | ICD-10-CM

## 2020-11-21 NOTE — Telephone Encounter (Signed)
Prior authorization has been started for Mounjaro. Will notify patient and provider once response is received.  

## 2020-11-21 NOTE — Telephone Encounter (Signed)
Kenlee wants to know when Dr. Juleen China is going to approve her medicine at the pharmacy.  Please give the patient a call to let her know

## 2020-11-25 ENCOUNTER — Emergency Department (HOSPITAL_COMMUNITY)
Admission: EM | Admit: 2020-11-25 | Discharge: 2020-11-25 | Disposition: A | Discharge status: Home / Self Care | Payer: BC Managed Care – PPO | Attending: Emergency Medicine | Admitting: Emergency Medicine

## 2020-11-25 ENCOUNTER — Encounter (HOSPITAL_COMMUNITY): Payer: Self-pay | Admitting: Emergency Medicine

## 2020-11-25 ENCOUNTER — Other Ambulatory Visit: Payer: Self-pay

## 2020-11-25 ENCOUNTER — Emergency Department (HOSPITAL_COMMUNITY): Payer: BC Managed Care – PPO

## 2020-11-25 DIAGNOSIS — E041 Nontoxic single thyroid nodule: Secondary | ICD-10-CM | POA: Diagnosis not present

## 2020-11-25 DIAGNOSIS — R6884 Jaw pain: Secondary | ICD-10-CM | POA: Insufficient documentation

## 2020-11-25 DIAGNOSIS — M542 Cervicalgia: Secondary | ICD-10-CM | POA: Insufficient documentation

## 2020-11-25 DIAGNOSIS — J45909 Unspecified asthma, uncomplicated: Secondary | ICD-10-CM | POA: Insufficient documentation

## 2020-11-25 DIAGNOSIS — Z794 Long term (current) use of insulin: Secondary | ICD-10-CM | POA: Insufficient documentation

## 2020-11-25 DIAGNOSIS — Z79899 Other long term (current) drug therapy: Secondary | ICD-10-CM | POA: Insufficient documentation

## 2020-11-25 DIAGNOSIS — E119 Type 2 diabetes mellitus without complications: Secondary | ICD-10-CM | POA: Insufficient documentation

## 2020-11-25 DIAGNOSIS — I10 Essential (primary) hypertension: Secondary | ICD-10-CM | POA: Diagnosis not present

## 2020-11-25 DIAGNOSIS — M47812 Spondylosis without myelopathy or radiculopathy, cervical region: Secondary | ICD-10-CM | POA: Diagnosis not present

## 2020-11-25 DIAGNOSIS — R1310 Dysphagia, unspecified: Secondary | ICD-10-CM | POA: Diagnosis not present

## 2020-11-25 LAB — COMPREHENSIVE METABOLIC PANEL
ALT: 62 U/L — ABNORMAL HIGH (ref 0–44)
AST: 33 U/L (ref 15–41)
Albumin: 3.1 g/dL — ABNORMAL LOW (ref 3.5–5.0)
Alkaline Phosphatase: 98 U/L (ref 38–126)
Anion gap: 7 (ref 5–15)
BUN: 16 mg/dL (ref 6–20)
CO2: 26 mmol/L (ref 22–32)
Calcium: 9.5 mg/dL (ref 8.9–10.3)
Chloride: 104 mmol/L (ref 98–111)
Creatinine, Ser: 0.84 mg/dL (ref 0.44–1.00)
GFR, Estimated: >60 mL/min (ref >60)
Glucose, Bld: 104 mg/dL — ABNORMAL HIGH (ref 70–99)
Potassium: 4.3 mmol/L (ref 3.5–5.1)
Sodium: 137 mmol/L (ref 135–145)
Total Bilirubin: 0.4 mg/dL (ref 0.3–1.2)
Total Protein: 7.3 g/dL (ref 6.5–8.1)

## 2020-11-25 LAB — CBC WITH DIFFERENTIAL/PLATELET
Abs Immature Granulocytes: 0.02 10*3/uL (ref 0.00–0.07)
Basophils Absolute: 0 10*3/uL (ref 0.0–0.1)
Basophils Relative: 0 %
Eosinophils Absolute: 0.2 10*3/uL (ref 0.0–0.5)
Eosinophils Relative: 3 %
HCT: 38.1 % (ref 36.0–46.0)
Hemoglobin: 11.9 g/dL — ABNORMAL LOW (ref 12.0–15.0)
Immature Granulocytes: 0 %
Lymphocytes Relative: 14 %
Lymphs Abs: 1 10*3/uL (ref 0.7–4.0)
MCH: 28.7 pg (ref 26.0–34.0)
MCHC: 31.2 g/dL (ref 30.0–36.0)
MCV: 91.8 fL (ref 80.0–100.0)
Monocytes Absolute: 0.5 10*3/uL (ref 0.1–1.0)
Monocytes Relative: 7 %
Neutro Abs: 5.4 10*3/uL (ref 1.7–7.7)
Neutrophils Relative %: 76 %
Platelets: 363 10*3/uL (ref 150–400)
RBC: 4.15 MIL/uL (ref 3.87–5.11)
RDW: 12.6 % (ref 11.5–15.5)
WBC: 7.1 10*3/uL (ref 4.0–10.5)
nRBC: 0 % (ref 0.0–0.2)

## 2020-11-25 LAB — SEDIMENTATION RATE: Sed Rate: 82 mm/hr — ABNORMAL HIGH (ref 0–22)

## 2020-11-25 LAB — C-REACTIVE PROTEIN: CRP: 3.1 mg/dL — ABNORMAL HIGH (ref <1.0)

## 2020-11-25 LAB — TSH: TSH: 1.145 u[IU]/mL (ref 0.350–4.500)

## 2020-11-25 LAB — T4, FREE: Free T4: 1.07 ng/dL (ref 0.61–1.12)

## 2020-11-25 MED ORDER — CYCLOBENZAPRINE HCL 5 MG PO TABS: 5.0000 mg | ORAL_TABLET | Freq: Three times a day (TID) | ORAL | 0 refills | Status: DC | PRN

## 2020-11-25 MED ORDER — KETOROLAC TROMETHAMINE 15 MG/ML IJ SOLN
15.0000 mg | Freq: Once | INTRAMUSCULAR | Status: AC
  Administered 2020-11-25: 15 mg via INTRAVENOUS
  Filled 2020-11-25: qty 1

## 2020-11-25 MED ORDER — IOHEXOL 300 MG/ML  SOLN
75.0000 mL | Freq: Once | INTRAMUSCULAR | Status: AC | PRN
  Administered 2020-11-25: 75 mL via INTRAVENOUS

## 2020-11-25 MED ORDER — LACTATED RINGERS IV BOLUS
1000.0000 mL | Freq: Once | INTRAVENOUS | Status: AC
  Administered 2020-11-25: 1000 mL via INTRAVENOUS

## 2020-11-25 NOTE — ED Triage Notes (Addendum)
Patient reports persistent pain at right jaw/right lateral neck with swelling ; hard to swallow onset 2 weeks ago , denies injury , no fever or chills .

## 2020-11-25 NOTE — Discharge Instructions (Signed)
Continue to treat pain with ibuprofen.  Additionally, there is a prescription for a muscle relaxer which may help as well.  Take this only as needed.  Be advised that this medication can cause some drowsiness and you should take with caution.  Please follow-up with your primary care doctor to ensure resolution of your current symptoms.

## 2020-11-25 NOTE — ED Provider Notes (Signed)
[facility] EMERGENCY DEPARTMENT Provider Note   CSN: [id number] Arrival date & time: 11/25/20  2620     History Chief Complaint  Patient presents with   Jaw/Neck pain     Linda Olson is a 60 y.o. female.  HPI Patient presents for pain and swelling in the area of her right jaw and lateral neck.  Progressive symptoms over the past 2 weeks.  Pain is worsened with palpation and swallowing.  Symptoms have progressed to the point that she has now eating soft foods only.  She denies ever having swelling like this in the past.  She has not had any known fevers but does feel like she may have had some chills over the past few days.  She presents to the ED today because the pain became so severe last night that she has difficult time sleeping.  She has been taking 800 mg ibuprofen for symptomatic relief.  She has not taken anything today.  Currently, pain is rated at 8/10 in severity.  Her medical history is notable for chronic back pain, prediabetes, and obesity.  She has also been told that she has some thyroid nodules.    Past Medical History:  Diagnosis Date   Asthma    Back pain    Bell's palsy    BMI 40.0-44.9, adult (HCC)    Chest pain    Chewing difficulty    Constipation    GERD (gastroesophageal reflux disease)    Hyperlipidemia    Hypertension    Low vitamin D level    per notes from [place]   Obesity    per notes from [place]   Orbital floor fracture [facility])    [facility]; per notes from [place]   Prediabetes    per notes from [place]   Preeclampsia    per notes from [place]    Patient Active Problem List   Diagnosis Date Noted   Bulge of lumbar disc without myelopathy 11/16/2020   BMI 40.0-44.9, adult ([place])    Hypertension    Bell's palsy 02/11/2020   Lumbar contusion 11/04/2019   History of lumbar fusion 10/06/2019   Fall 10/06/2019   Contusion of right shoulder 10/06/2019   Chest pain  03/19/2019   HTN (hypertension) 03/19/2019   HLD (hyperlipidemia) 03/19/2019   Type 2 diabetes mellitus ([place]) 03/19/2019   Pre-diabetes 11/12/2017    Past Surgical History:  Procedure Laterality Date   BACK SURGERY     COLONOSCOPY     per notes from [place] ARTHROSCOPY     VAGINAL HYSTERECTOMY       OB History     Gravida  1   Para  1   Term      Preterm      AB      Living         SAB      IAB      Ectopic      Multiple      Live Births              Family History  Problem Relation Age of Onset   High blood pressure Sister    Stroke Maternal Aunt    Hypertension Mother    Hypertension Father    Hyperlipidemia Father    Breast cancer Neg Hx     Social History   Tobacco Use   Smoking status: Never   Smokeless tobacco: Never  Substance Use Topics   Alcohol use: No   Drug use: No    Home Medications Prior to Admission medications   Medication Sig Start Date End Date Taking? Authorizing Provider  cyclobenzaprine (FLEXERIL) 5 MG tablet Take 1 tablet (5 mg total) by mouth 3 (three) times daily as needed for up to 6 doses for muscle spasms. 11/25/20  Yes Godfrey Pick, MD  atorvastatin (LIPITOR) 40 MG tablet Take 1 tablet (40 mg total) by mouth daily. 03/21/19   Black, Lezlie Octave, NP  ibuprofen (ADVIL) 800 MG tablet TAKE 1 TABLET BY MOUTH EVERY 12 HOURS AS NEEDED 10/10/20   Marybelle Killings, MD  losartan-hydrochlorothiazide (HYZAAR) 50-12.5 MG tablet Take 1 tablet by mouth daily. 07/21/18   [provider]  tirzepatide [name]) 5 MG/0.5ML Pen Inject 5 mg into the skin once a week. 11/16/20   Briscoe Deutscher, DO    Allergies    Other and Oxycontin [oxycodone]  Review of Systems   Review of Systems  Constitutional:  Positive for appetite change and chills. Negative for fatigue and fever.  HENT:  Positive for ear pain, facial swelling, sore throat and trouble swallowing. Negative for congestion, dental problem, drooling, ear  discharge, hearing loss, mouth sores and voice change.   Eyes:  Negative for photophobia, pain, redness and visual disturbance.  Respiratory:  Negative for cough, choking, chest tightness, shortness of breath and wheezing.   Cardiovascular:  Negative for chest pain and palpitations.  Gastrointestinal:  Negative for abdominal pain, diarrhea, nausea and vomiting.  Genitourinary:  Negative for dysuria, flank pain and hematuria.  Musculoskeletal:  Positive for neck pain. Negative for arthralgias, back pain, joint swelling and myalgias.  Skin:  Negative for color change and rash.  Neurological:  Negative for dizziness, seizures, syncope, speech difficulty, weakness, light-headedness, numbness and headaches.  Hematological:  Does not bruise/bleed easily.  Psychiatric/Behavioral:  Negative for confusion and decreased concentration.   All other systems reviewed and are negative.  Physical Exam Updated Vital Signs BP 126/78   Pulse 65   Temp 99.2 F (37.3 C) (Oral)   Resp 16   Ht 5' (1.524 m)   Wt 108 kg   SpO2 99%   BMI 46.50 kg/m   Physical Exam Vitals and nursing note reviewed.  Constitutional:      General: She is not in acute distress.    Appearance: Normal appearance. She is well-developed. She is not ill-appearing, toxic-appearing or diaphoretic.  HENT:     Head: Normocephalic and atraumatic.     Right Ear: Tympanic membrane, ear canal and external ear normal.     Left Ear: Tympanic membrane, ear canal and external ear normal.     Nose: Nose normal. No congestion.     Mouth/Throat:     Lips: No lesions.     Mouth: Mucous membranes are moist. No oral lesions.     Dentition: Normal dentition.     Tongue: No lesions.     Palate: No lesions.     Pharynx: Oropharynx is clear. Uvula midline. No posterior oropharyngeal erythema.     Tonsils: No tonsillar exudate. 2+ on the right. 3+ on the left.  Eyes:     Conjunctiva/sclera: Conjunctivae normal.  Cardiovascular:     Rate and  Rhythm: Normal rate and regular rhythm.     Heart sounds: No murmur heard. Pulmonary:     Effort: Pulmonary effort is normal. No respiratory distress.     Breath sounds: Normal breath sounds.  Abdominal:  Palpations: Abdomen is soft.     Tenderness: There is no abdominal tenderness.  Musculoskeletal:     Cervical back: Normal range of motion and neck supple. Tenderness present. No rigidity.  Lymphadenopathy:     Cervical: No cervical adenopathy.  Skin:    General: Skin is warm and dry.  Neurological:     General: No focal deficit present.     Mental Status: She is alert and oriented to person, place, and time.     Cranial Nerves: No cranial nerve deficit.     Sensory: No sensory deficit.     Motor: No weakness.  Psychiatric:        Mood and Affect: Mood normal.        Behavior: Behavior normal.        Thought Content: Thought content normal.        Judgment: Judgment normal.    ED Results / Procedures / Treatments   Labs (all labs ordered are listed, but only abnormal results are displayed) Labs Reviewed  CBC WITH DIFFERENTIAL/PLATELET - Abnormal; Notable for the following components:      Result Value   Hemoglobin 11.9 (*)    All other components within normal limits  COMPREHENSIVE METABOLIC PANEL - Abnormal; Notable for the following components:   Glucose, Bld 104 (*)    Albumin 3.1 (*)    ALT 62 (*)    All other components within normal limits  SEDIMENTATION RATE - Abnormal; Notable for the following components:   Sed Rate 82 (*)    All other components within normal limits  C-REACTIVE PROTEIN - Abnormal; Notable for the following components:   CRP 3.1 (*)    All other components within normal limits  T4, FREE  TSH    EKG None  Radiology CT Soft Tissue Neck W Contrast  Result Date: 11/25/2020 CLINICAL DATA:  Lump on right side of the neck, trouble swallowing for 2 weeks EXAM: CT NECK WITH CONTRAST TECHNIQUE: Multidetector CT imaging of the neck was  performed using the standard protocol following the bolus administration of intravenous contrast. CONTRAST:  38mL OMNIPAQUE IOHEXOL 300 MG/ML  SOLN COMPARISON:  None. FINDINGS: Pharynx and larynx: The nasal cavity and nasopharynx are normal. The oropharynx and oral cavity are normal. The hypopharynx and larynx are normal. There is no abnormal soft tissue mass or abscess. The parapharyngeal spaces are clear. Salivary glands: The parotid and submandibular glands are normal. Thyroid: There is a 1.3 cm right thyroid nodule. No follow-up imaging is recommended. Reference: J Am Coll Radiol. 2015 Feb;12(2): 143-50. Lymph nodes: There is no pathologic lymphadenopathy in the neck. Vascular: The vasculature is unremarkable. Limited intracranial: The imaged intracranial compartment is unremarkable. Visualized orbits: The globes and orbits are unremarkable. Mastoids and visualized paranasal sinuses: The paranasal sinuses are clear. The mastoid air cells are clear. Skeleton: There is mild multilevel degenerative change of the cervical spine, most advanced at C5-C6. Upper chest: The imaged lung apices are clear. Other: There is no abnormal finding at the indicated site of clinical concern. IMPRESSION: Unremarkable CT of the neck. Specifically, there is no abnormality at the indicated site of concern. Electronically Signed   By: Valetta Mole M.D.   On: 11/25/2020 10:51    Procedures Procedures   Medications Ordered in ED Medications  lactated ringers bolus 1,000 mL (1,000 mLs Intravenous New Bag/Given 11/25/20 1005)  ketorolac (TORADOL) 15 MG/ML injection 15 mg (15 mg Intravenous Given 11/25/20 1006)  iohexol (OMNIPAQUE) 300 MG/ML solution 75  mL (75 mLs Intravenous Contrast Given 11/25/20 1034)    ED Course  I have reviewed the triage vital signs and the nursing notes.  Pertinent labs & imaging results that were available during my care of the patient were reviewed by me and considered in my medical decision making  (see chart for details).    MDM Rules/Calculators/A&P                           Patient presents for left-sided neck swelling and pain.  She is afebrile upon arrival.  Vital signs are normal.  She has not had any known fevers but does feel like she may have had some chills recently.  On exam, neck swelling is difficult to appreciate due to habitus.  She does endorse tenderness in this area.  No masses or areas of fluctuance are appreciated.  Patient has no trismus.  Oral mucosa without lesions.  Oropharynx does not have any erythema.  There is slightly increased swelling of tonsillar area that is actually greater on the left, despite symptoms on the right.  Of note, patient has increased pain in the area of concern when rotating her head to the contralateral side, consistent with SCM inflammation.  Labs and CT studies ordered.  Patient was given IV fluids due to decreased p.o. intake.  Additionally, she was given Toradol for analgesia.  Labs show no leukocytosis.  Inflammatory markers were slightly elevated, which is a nonspecific finding.  On reassessment, patient reported improvement in pain.  CT scans did not show any abnormalities.  She was informed of these reassuring results.  She was advised to continue supportive care with NSAIDs for symptomatic relief and to follow-up with her primary care doctor..  Additionally as needed muscle relaxer prescribed.  She was discharged in good condition.  Final Clinical Impression(s) / ED Diagnoses Final diagnoses:  Neck pain    Rx / DC Orders ED Discharge Orders          Ordered    cyclobenzaprine (FLEXERIL) 5 MG tablet  3 times daily PRN        11/25/20 1115             Godfrey Pick, MD 11/25/20 1116

## 2020-11-25 NOTE — ED Notes (Signed)
Patient transported to CT 

## 2020-12-05 ENCOUNTER — Other Ambulatory Visit: Payer: Self-pay

## 2020-12-05 ENCOUNTER — Ambulatory Visit: Payer: Self-pay

## 2020-12-05 ENCOUNTER — Encounter: Payer: Self-pay | Admitting: Physical Medicine and Rehabilitation

## 2020-12-05 ENCOUNTER — Ambulatory Visit: Payer: BC Managed Care – PPO | Admitting: Physical Medicine and Rehabilitation

## 2020-12-05 VITALS — BP 131/85 | HR 70

## 2020-12-05 DIAGNOSIS — M961 Postlaminectomy syndrome, not elsewhere classified: Secondary | ICD-10-CM

## 2020-12-05 DIAGNOSIS — M5116 Intervertebral disc disorders with radiculopathy, lumbar region: Secondary | ICD-10-CM

## 2020-12-05 DIAGNOSIS — M5416 Radiculopathy, lumbar region: Secondary | ICD-10-CM

## 2020-12-05 MED ORDER — BETAMETHASONE SOD PHOS & ACET 6 (3-3) MG/ML IJ SUSP: 12.0000 mg | Freq: Once | INTRAMUSCULAR | Status: DC

## 2020-12-05 NOTE — Progress Notes (Signed)
Pt state lower back pain. Pt state bending or picking up items makes the pain worse.Pt state the comes worse at night time. Pt state she takes over the counter pain meds to help ease her pain.  Numeric Pain Rating Scale and Functional Assessment Average Pain 5    In the last MONTH (on 0-10 scale) has pain interfered with the following?  1. General activity like being  able to carry out your everyday physical activities such as walking, climbing stairs, carrying groceries, or moving a chair?  Rating(9)   +Driver, -BT, -Dye Allergies.

## 2020-12-05 NOTE — Patient Instructions (Signed)

## 2020-12-08 ENCOUNTER — Other Ambulatory Visit: Payer: Self-pay

## 2020-12-08 ENCOUNTER — Encounter (INDEPENDENT_AMBULATORY_CARE_PROVIDER_SITE_OTHER): Payer: Self-pay | Admitting: Physician Assistant

## 2020-12-08 ENCOUNTER — Ambulatory Visit (INDEPENDENT_AMBULATORY_CARE_PROVIDER_SITE_OTHER): Payer: BC Managed Care – PPO | Admitting: Physician Assistant

## 2020-12-08 VITALS — BP 115/76 | HR 68 | Temp 97.9°F | Ht 60.0 in | Wt 217.0 lb

## 2020-12-08 DIAGNOSIS — E1169 Type 2 diabetes mellitus with other specified complication: Secondary | ICD-10-CM

## 2020-12-08 DIAGNOSIS — Z6841 Body Mass Index (BMI) 40.0 and over, adult: Secondary | ICD-10-CM

## 2020-12-08 NOTE — Progress Notes (Signed)
Chief Complaint:   OBESITY Linda Olson is here to discuss her progress with her obesity treatment plan along with follow-up of her obesity related diagnoses. Linda Olson is on the Category 2 Plan and states she is following her eating plan approximately 50-60% of the time. Linda Olson states she is doing 0 minutes 0 times per week.  Today's visit was #: 9 Starting weight: 237 lbs Starting date: 04/28/2020 Today's weight: 217 lbs Today's date: 12/08/2020 Total lbs lost to date: 20 lbs Total lbs lost since last in-office visit: 0  Interim History: Linda Olson reports eating a lot of rice and potatoes the past few weeks. She wants to review Category 2 again today, as she has gotten away from eating things on plan.  Subjective:   1. Type 2 diabetes mellitus with other specified complication, without long-term current use of insulin (HCC) Linda Olson has been on Mounjaro 5 mg for 1 week. She states she hasn't seen a decrease in her appetite as of yet. She does not want to increase dose at this time.  Assessment/Plan:   1. Type 2 diabetes mellitus with other specified complication, without long-term current use of insulin ([place]) Linda Olson will continue Mounjaro. Good blood sugar control is important to decrease the likelihood of diabetic complications such as nephropathy, neuropathy, limb loss, blindness, coronary artery disease, and death. Intensive lifestyle modification including diet, exercise and weight loss are the first line of treatment for diabetes.   2. Obesity, current BMI 42.38 Linda Olson is currently in the action stage of change. As such, her goal is to continue with weight loss efforts. She has agreed to the Category 2 Plan.   Exercise goals: No exercise has been prescribed at this time.  Behavioral modification strategies: meal planning and cooking strategies and planning for success.  Linda Olson has agreed to follow-up with our clinic in 3 weeks. She was informed of the importance  of frequent follow-up visits to maximize her success with intensive lifestyle modifications for her multiple health conditions.   Objective:   Blood pressure 115/76, pulse 68, temperature 97.9 F (36.6 C), height 5' (1.524 m), weight 217 lb (98.4 kg), SpO2 100 %. Body mass index is 42.38 kg/m.  General: Cooperative, alert, well developed, in no acute distress. HEENT: Conjunctivae and lids unremarkable. Cardiovascular: Regular rhythm.  Lungs: Normal work of breathing. Neurologic: No focal deficits.   Lab Results  Component Value Date   CREATININE 0.84 11/25/2020   BUN 16 11/25/2020   NA 137 11/25/2020   K 4.3 11/25/2020   CL 104 11/25/2020   CO2 26 11/25/2020   Lab Results  Component Value Date   ALT 62 (H) 11/25/2020   AST 33 11/25/2020   ALKPHOS 98 11/25/2020   BILITOT 0.4 11/25/2020   Lab Results  Component Value Date   HGBA1C 6.0 (H) 08/09/2020   HGBA1C 8.2 (H) 04/29/2020   HGBA1C 6.2 (H) 03/19/2019   Lab Results  Component Value Date   INSULIN 16.0 08/09/2020   INSULIN 22.1 04/29/2020   Lab Results  Component Value Date   TSH 1.145 11/25/2020   Lab Results  Component Value Date   CHOL 193 08/09/2020   HDL 51 08/09/2020   LDLCALC 129 (H) 08/09/2020   TRIG 68 08/09/2020   CHOLHDL 3.8 08/09/2020   Lab Results  Component Value Date   VD25OH 65.9 08/09/2020   VD25OH 21.9 (L) 04/29/2020   Lab Results  Component Value Date   WBC 7.1 11/25/2020   HGB 11.9 (L) 11/25/2020  HCT 38.1 11/25/2020   MCV 91.8 11/25/2020   PLT 363 11/25/2020   Lab Results  Component Value Date   IRON 69 04/29/2020   TIBC 311 04/29/2020   FERRITIN 100 04/29/2020   Attestation Statements:   Reviewed by clinician on day of visit: allergies, medications, problem list, medical history, surgical history, family history, social history, and previous encounter notes.  Time spent on visit including pre-visit chart review and post-visit care and charting was 31 minutes.   I,  Tonye Pearson, am acting as Location manager for [employer], PA-C.   I have reviewed the above documentation for accuracy and completeness, and I agree with the above. Abby Potash, PA-C

## 2020-12-11 NOTE — Procedures (Signed)
Lumbar Epidural Steroid Injection - Interlaminar Approach with Fluoroscopic Guidance  Patient: Linda Olson      Date of Birth: 06-07-1960 MRN: [id number] PCP: [name], MD      Visit Date: 12/05/2020   Universal Protocol:     Consent Given By: the patient  Position: PRONE  Additional Comments: Vital signs were monitored before and after the procedure. Patient was prepped and draped in the usual sterile fashion. The correct patient, procedure, and site was verified.   Injection Procedure Details:   Procedure diagnoses: Lumbar radiculopathy [M54.16]   Meds Administered:  Meds ordered this encounter  Medications   betamethasone acetate-betamethasone sodium phosphate (CELESTONE) injection 12 mg     Laterality: Right  Location/Site:  L3-4  Needle: 3.5 in., 20 ga. Tuohy  Needle Placement: Paramedian epidural  Findings:   -Comments: Excellent flow of contrast into the epidural space.  Procedure Details: Using a paramedian approach from the side mentioned above, the region overlying the inferior lamina was localized under fluoroscopic visualization and the soft tissues overlying this structure were infiltrated with 4 ml. of 1% Lidocaine without Epinephrine. The Tuohy needle was inserted into the epidural space using a paramedian approach.   The epidural space was localized using loss of resistance along with counter oblique bi-planar fluoroscopic views.  After negative aspirate for air, blood, and CSF, a 2 ml. volume of Isovue-250 was injected into the epidural space and the flow of contrast was observed. Radiographs were obtained for documentation purposes.    The injectate was administered into the level noted above.   Additional Comments:  The patient tolerated the procedure well Dressing: 2 x 2 sterile gauze and Band-Aid    Post-procedure details: Patient was observed during the procedure. Post-procedure instructions were reviewed.  Patient left the  clinic in stable condition.

## 2020-12-11 NOTE — Progress Notes (Signed)
[name] - 60 y.o. female MRN [id number]  Date of birth: September 27, 1960  Office Visit Note: Visit Date: 12/05/2020 PCP: [name], MD Referred by: [name], MD  Subjective: Chief Complaint  Patient presents with   Lower Back - Pain   HPI:  Linda Olson is a 60 y.o. female who comes in today at the request of Dr. Rodell Perna for planned Right L3-4 Lumbar Interlaminar epidural steroid injection with fluoroscopic guidance.  The patient has failed conservative care including home exercise, medications, time and activity modification.  This injection will be diagnostic and hopefully therapeutic.  Please see requesting physician notes for further details and justification. MRI reviewed with images and spine model.  MRI reviewed in the note below.    ROS Otherwise per HPI.  Assessment & Plan: Visit Diagnoses:    ICD-10-CM   1. Lumbar radiculopathy  M54.16 XR C-ARM NO REPORT    Epidural Steroid injection    betamethasone acetate-betamethasone sodium phosphate (CELESTONE) injection 12 mg    2. Radiculopathy due to lumbar intervertebral disc disorder  M51.16     3. Post laminectomy syndrome  M96.1       Plan: No additional findings.   Meds & Orders:  Meds ordered this encounter  Medications   betamethasone acetate-betamethasone sodium phosphate (CELESTONE) injection 12 mg    Orders Placed This Encounter  Procedures   XR C-ARM NO REPORT   Epidural Steroid injection    Follow-up: No follow-ups on file.   Procedures: No procedures performed  Lumbar Epidural Steroid Injection - Interlaminar Approach with Fluoroscopic Guidance  Patient: Linda Olson      Date of Birth: Jul 15, 1960 MRN: [id number] PCP: [name], MD      Visit Date: 12/05/2020   Universal Protocol:     Consent Given By: the patient  Position: PRONE  Additional Comments: Vital signs were monitored before and after the procedure. Patient was prepped and draped in the usual  sterile fashion. The correct patient, procedure, and site was verified.   Injection Procedure Details:   Procedure diagnoses: Lumbar radiculopathy [M54.16]   Meds Administered:  Meds ordered this encounter  Medications   betamethasone acetate-betamethasone sodium phosphate (CELESTONE) injection 12 mg     Laterality: Right  Location/Site:  L3-4  Needle: 3.5 in., 20 ga. Tuohy  Needle Placement: Paramedian epidural  Findings:   -Comments: Excellent flow of contrast into the epidural space.  Procedure Details: Using a paramedian approach from the side mentioned above, the region overlying the inferior lamina was localized under fluoroscopic visualization and the soft tissues overlying this structure were infiltrated with 4 ml. of 1% Lidocaine without Epinephrine. The Tuohy needle was inserted into the epidural space using a paramedian approach.   The epidural space was localized using loss of resistance along with counter oblique bi-planar fluoroscopic views.  After negative aspirate for air, blood, and CSF, a 2 ml. volume of Isovue-250 was injected into the epidural space and the flow of contrast was observed. Radiographs were obtained for documentation purposes.    The injectate was administered into the level noted above.   Additional Comments:  The patient tolerated the procedure well Dressing: 2 x 2 sterile gauze and Band-Aid    Post-procedure details: Patient was observed during the procedure. Post-procedure instructions were reviewed.  Patient left the clinic in stable condition.   Clinical History: MRI LUMBAR SPINE WITHOUT CONTRAST    COMPARISON:  Lumbar spine radiographs 10/06/2019. Lumbar spine MRI 02/09/2010.  FINDINGS: Segmentation: 5 lumbar vertebrae (correlating with prior lumbar spine radiographs).   Alignment:  Mild L5-S1 fused grade 1 anterolisthesis.   Vertebrae: Prior L5-S1 posterior decompression and posterior spinal fusion. Susceptibility  artifact arising from the spinal fusion construct limits evaluation of marrow signal at this level. Vertebral body height is maintained. Partially imaged nonspecific edema within the right iliac bone (series 4, image 1). No significant marrow edema or suspicious osseous lesion identified elsewhere.   Conus medullaris and cauda equina: Conus extends to the L1 level. No signal abnormality within the visualized distal spinal cord.   Paraspinal and other soft tissues: Incompletely assessed left renal cysts. Atrophy of the lumbar paraspinal musculature. Postsurgical changes to the dorsal soft tissues overlying the lower lumbar spine.   Disc levels:   Unless otherwise stated, the level by level findings below have not significantly changed since prior MRI 02/10/2010.   No more than mild disc degeneration at the non fused levels.   T12-L1: Small disc bulge. Mild facet arthrosis. No significant spinal canal or foraminal narrowing.   L1-L2: Mild facet arthrosis. No significant disc herniation or stenosis.   L2-L3: Minimal disc bulge. Mild facet arthrosis/ligamentum flavum hypertrophy. Mild left subarticular narrowing without nerve root impingement. Central canal patent. Mild bilateral neural foraminal narrowing.   L3-L4: Disc bulge. Superimposed shallow broad-based central disc protrusion. Moderate facet arthrosis. Mild bilateral subarticular narrowing with slight crowding of the descending L4 nerve roots (series 6, image 20). Mild relative narrowing of the central canal. Mild bilateral neural foraminal narrowing.   L4-L5: Post laminectomy changes, new from the prior examination. Tiny left center disc protrusion, new from the prior examination (series 6, image 26). Posterior element hypertrophy. No significant spinal canal stenosis. Mild right neural foraminal narrowing.   L5-S1: Interval posterior decompression and posterior spinal fusion. Small fluid collection within the  laminectomy bed posterior to the thecal sac, measuring 1.5 x 0.4 cm in transaxial dimensions and likely reflecting a seroma (series 6, image 29). Posterior element hypertrophy. No significant spinal canal stenosis. Susceptibility artifact limits evaluation of the neural foramina. No appreciable left neural foraminal narrowing. Suspected mild right neural foraminal narrowing related to endplate spurring and posterior element hypertrophy.   IMPRESSION: Comparison is made with the prior lumbar spine MRI of 02/10/2010. Lumbar spondylosis and postsurgical changes as described. Findings are most notably as follows.   At L5-S1, there has been interval posterior decompression and posterior spinal fusion. No significant spinal canal stenosis remains. Susceptibility artifact limits evaluation of the neural foramina. Suspected mild right neural foraminal narrowing secondary to endplate spurring and posterior element hypertrophy.   At L4-L5, post laminectomy changes are new from the prior MRI. There is a new tiny left center disc protrusion without spinal canal stenosis. Posterior element hypertrophy contributes to mild right neural foraminal narrowing.   At L3-L4, there is a disc bulge. Superimposed shallow broad-based central disc protrusion. Moderate facet arthrosis. Mild bilateral subarticular narrowing with slight crowding of the descending L4 nerve roots. Mild relative narrowing of the central canal. Mild bilateral neural foraminal narrowing. These findings are unchanged.   Partially imaged nonspecific edema within the right iliac bone.     Electronically Signed   By: Kellie Simmering DO   On: 01/10/2020 08:15     Objective:  VS:  HT:    WT:   BMI:     BP:131/85  HR:70bpm  TEMP: ( )  RESP:  Physical Exam Vitals and nursing note reviewed.  Constitutional:  General: She is not in acute distress.    Appearance: Normal appearance. She is obese. She is not ill-appearing.   HENT:     Head: Normocephalic and atraumatic.     Right Ear: External ear normal.     Left Ear: External ear normal.  Eyes:     Extraocular Movements: Extraocular movements intact.  Cardiovascular:     Rate and Rhythm: Normal rate.     Pulses: Normal pulses.  Pulmonary:     Effort: Pulmonary effort is normal. No respiratory distress.  Abdominal:     General: There is no distension.     Palpations: Abdomen is soft.  Musculoskeletal:        General: Tenderness present.     Cervical back: Neck supple.     Right lower leg: No edema.     Left lower leg: No edema.     Comments: Patient has good distal strength with no pain over the greater trochanters.  No clonus or focal weakness.  Skin:    Findings: No erythema, lesion or rash.  Neurological:     General: No focal deficit present.     Mental Status: She is alert and oriented to person, place, and time.     Sensory: No sensory deficit.     Motor: No weakness or abnormal muscle tone.     Coordination: Coordination normal.  Psychiatric:        Mood and Affect: Mood normal.        Behavior: Behavior normal.     Imaging: No results found.

## 2020-12-27 ENCOUNTER — Ambulatory Visit
Admission: RE | Admit: 2020-12-27 | Discharge: 2020-12-27 | Disposition: A | Discharge status: Home / Self Care | Payer: BC Managed Care – PPO | Source: Ambulatory Visit | Attending: Family Medicine | Admitting: Family Medicine

## 2020-12-27 ENCOUNTER — Other Ambulatory Visit (HOSPITAL_COMMUNITY)
Admission: RE | Admit: 2020-12-27 | Discharge: 2020-12-27 | Disposition: A | Discharge status: Home / Self Care | Payer: BC Managed Care – PPO | Source: Ambulatory Visit | Attending: Radiology | Admitting: Radiology

## 2020-12-27 DIAGNOSIS — D34 Benign neoplasm of thyroid gland: Secondary | ICD-10-CM | POA: Diagnosis not present

## 2020-12-27 DIAGNOSIS — E041 Nontoxic single thyroid nodule: Secondary | ICD-10-CM

## 2020-12-27 DIAGNOSIS — E042 Nontoxic multinodular goiter: Secondary | ICD-10-CM | POA: Diagnosis not present

## 2020-12-28 LAB — CYTOLOGY - NON PAP

## 2020-12-30 LAB — HM DIABETES EYE EXAM

## 2021-01-02 ENCOUNTER — Other Ambulatory Visit: Payer: Self-pay

## 2021-01-02 ENCOUNTER — Ambulatory Visit (INDEPENDENT_AMBULATORY_CARE_PROVIDER_SITE_OTHER): Payer: BC Managed Care – PPO | Admitting: Physician Assistant

## 2021-01-02 ENCOUNTER — Encounter (INDEPENDENT_AMBULATORY_CARE_PROVIDER_SITE_OTHER): Payer: Self-pay | Admitting: Physician Assistant

## 2021-01-02 VITALS — BP 116/75 | HR 73 | Temp 98.2°F | Ht 60.0 in | Wt 218.0 lb

## 2021-01-02 DIAGNOSIS — E1169 Type 2 diabetes mellitus with other specified complication: Secondary | ICD-10-CM

## 2021-01-02 DIAGNOSIS — Z9189 Other specified personal risk factors, not elsewhere classified: Secondary | ICD-10-CM | POA: Diagnosis not present

## 2021-01-02 DIAGNOSIS — Z6841 Body Mass Index (BMI) 40.0 and over, adult: Secondary | ICD-10-CM | POA: Diagnosis not present

## 2021-01-02 MED ORDER — TIRZEPATIDE 7.5 MG/0.5ML ~~LOC~~ SOAJ: 7.5000 mg | SUBCUTANEOUS | 0 refills | Status: DC

## 2021-01-02 NOTE — Progress Notes (Signed)
Chief Complaint:   OBESITY Linda Olson is here to discuss her progress with her obesity treatment plan along with follow-up of her obesity related diagnoses. Linda Olson is on the Category 2 Plan and states she is following her eating plan approximately 50% of the time. Linda Olson states she is walking 7,000 steps 5 times per week.  Today's visit was #: 10 Starting weight: 237 lbs Starting date: 04/28/2020 Today's weight: 218 lbs Today's date: 01/02/2021 Total lbs lost to date: 19 Total lbs lost since last in-office visit: 0  Interim History: Linda Olson reports cravings and hunger despite being on Mounjaro 5 mg. She is eating more chocolate than normal. She is drinking smoothies for breakfast using yogurt and fruit.  Subjective:   1. Type 2 diabetes mellitus with other specified complication, without long-term current use of insulin ([place]) Linda Olson reports hunger and she is having cravings.  2. At risk for hypoglycemia Linda Olson is at increased risk for hypoglycemia due to changes in diet, diagnosis of diabetes, and/or insulin use.  Assessment/Plan:   1. Type 2 diabetes mellitus with other specified complication, without long-term current use of insulin ([place]) Linda Olson agreed to increase Mounjaro to 7.5 mg q weekly with no refills. Good blood sugar control is important to decrease the likelihood of diabetic complications such as nephropathy, neuropathy, limb loss, blindness, coronary artery disease, and death. Intensive lifestyle modification including diet, exercise and weight loss are the first line of treatment for diabetes.   2. At risk for hypoglycemia Linda Olson was given approximately 15 minutes of counseling today regarding prevention of hypoglycemia. She was advised of symptoms of hypoglycemia. Linda Olson was instructed to avoid skipping meals, eat regular protein rich meals and schedule low calorie snacks as needed.   Repetitive spaced learning was employed today to elicit  superior memory formation and behavioral change   3. Obesity, current BMI 42.58 Linda Olson is currently in the action stage of change. As such, her goal is to continue with weight loss efforts. She has agreed to the Category 2 Plan.   Exercise goals: As is, and 3 days of upper body strength training for 20 minutes.  Behavioral modification strategies: increasing lean protein intake.  Linda Olson has agreed to follow-up with our clinic in 3 weeks. She was informed of the importance of frequent follow-up visits to maximize her success with intensive lifestyle modifications for her multiple health conditions.   Objective:   Blood pressure 116/75, pulse 73, temperature 98.2 F (36.8 C), height 5' (1.524 m), weight 218 lb (98.9 kg), SpO2 97 %. Body mass index is 42.58 kg/m.  General: Cooperative, alert, well developed, in no acute distress. HEENT: Conjunctivae and lids unremarkable. Cardiovascular: Regular rhythm.  Lungs: Normal work of breathing. Neurologic: No focal deficits.   Lab Results  Component Value Date   CREATININE 0.84 11/25/2020   BUN 16 11/25/2020   NA 137 11/25/2020   K 4.3 11/25/2020   CL 104 11/25/2020   CO2 26 11/25/2020   Lab Results  Component Value Date   ALT 62 (H) 11/25/2020   AST 33 11/25/2020   ALKPHOS 98 11/25/2020   BILITOT 0.4 11/25/2020   Lab Results  Component Value Date   HGBA1C 6.0 (H) 08/09/2020   HGBA1C 8.2 (H) 04/29/2020   HGBA1C 6.2 (H) 03/19/2019   Lab Results  Component Value Date   INSULIN 16.0 08/09/2020   INSULIN 22.1 04/29/2020   Lab Results  Component Value Date   TSH 1.145 11/25/2020   Lab Results  Component  Value Date   CHOL 193 08/09/2020   HDL 51 08/09/2020   LDLCALC 129 (H) 08/09/2020   TRIG 68 08/09/2020   CHOLHDL 3.8 08/09/2020   Lab Results  Component Value Date   VD25OH 65.9 08/09/2020   VD25OH 21.9 (L) 04/29/2020   Lab Results  Component Value Date   WBC 7.1 11/25/2020   HGB 11.9 (L) 11/25/2020   HCT  38.1 11/25/2020   MCV 91.8 11/25/2020   PLT 363 11/25/2020   Lab Results  Component Value Date   IRON 69 04/29/2020   TIBC 311 04/29/2020   FERRITIN 100 04/29/2020   Attestation Statements:   Reviewed by clinician on day of visit: allergies, medications, problem list, medical history, surgical history, family history, social history, and previous encounter notes.   Wilhemena Durie, am acting as transcriptionist for [employer], PA-C.  I have reviewed the above documentation for accuracy and completeness, and I agree with the above. Abby Potash, PA-C

## 2021-01-04 ENCOUNTER — Encounter (INDEPENDENT_AMBULATORY_CARE_PROVIDER_SITE_OTHER): Payer: Self-pay | Admitting: Physician Assistant

## 2021-01-09 ENCOUNTER — Other Ambulatory Visit: Payer: Self-pay | Admitting: Orthopaedic Surgery

## 2021-01-09 NOTE — Telephone Encounter (Signed)
Please advise 

## 2021-01-22 ENCOUNTER — Encounter (INDEPENDENT_AMBULATORY_CARE_PROVIDER_SITE_OTHER): Payer: Self-pay

## 2021-01-23 ENCOUNTER — Ambulatory Visit (INDEPENDENT_AMBULATORY_CARE_PROVIDER_SITE_OTHER): Payer: BC Managed Care – PPO | Admitting: Physician Assistant

## 2021-01-25 ENCOUNTER — Telehealth: Payer: Self-pay | Admitting: Orthopaedic Surgery

## 2021-01-25 NOTE — Telephone Encounter (Signed)
Fax failed x 2 to [employer] [employer]. Records mailed [place]

## 2021-02-07 ENCOUNTER — Other Ambulatory Visit: Payer: Self-pay

## 2021-02-07 ENCOUNTER — Telehealth (INDEPENDENT_AMBULATORY_CARE_PROVIDER_SITE_OTHER): Payer: BC Managed Care – PPO | Admitting: Family Medicine

## 2021-02-07 ENCOUNTER — Encounter (INDEPENDENT_AMBULATORY_CARE_PROVIDER_SITE_OTHER): Payer: Self-pay | Admitting: Family Medicine

## 2021-02-07 DIAGNOSIS — E1159 Type 2 diabetes mellitus with other circulatory complications: Secondary | ICD-10-CM | POA: Diagnosis not present

## 2021-02-07 DIAGNOSIS — E1169 Type 2 diabetes mellitus with other specified complication: Secondary | ICD-10-CM | POA: Diagnosis not present

## 2021-02-07 DIAGNOSIS — I152 Hypertension secondary to endocrine disorders: Secondary | ICD-10-CM

## 2021-02-07 DIAGNOSIS — Z6841 Body Mass Index (BMI) 40.0 and over, adult: Secondary | ICD-10-CM

## 2021-02-07 DIAGNOSIS — F4321 Adjustment disorder with depressed mood: Secondary | ICD-10-CM

## 2021-02-07 MED ORDER — TIRZEPATIDE 7.5 MG/0.5ML ~~LOC~~ SOAJ: 7.5000 mg | SUBCUTANEOUS | 0 refills | Status: DC

## 2021-02-07 NOTE — Progress Notes (Signed)
TeleHealth Visit:  Due to the COVID-19 pandemic, this visit was completed with telemedicine (audio/video) technology to reduce patient and provider exposure as well as to preserve personal protective equipment.   [name] has verbally consented to this TeleHealth visit. The patient is located at home, the provider is located at the [facility] office. The participants in this visit include the listed provider and patient. The visit was conducted today via MyChart video.  Chief Complaint: OBESITY Linda Olson is here to discuss her progress with her obesity treatment plan along with follow-up of her obesity related diagnoses. Linda Olson is on the Category 2 Plan and states she is following her eating plan approximately 70% of the time. Linda Olson states she is walking/cardio/strength for 20 minutes 3 times per week.  Today's visit was #: 11 Starting weight: 237 lbs Starting date: 04/28/2020  Interim History: Linda Olson has been on Mounjaro 7.5 mg weekly.  She says that her father died a few weeks ago.  Assessment/Plan:   1. Type 2 diabetes mellitus with other specified complication, without long-term current use of insulin (HCC) Diabetes Mellitus: Not at goal. Medication: Mounjaro 7.5 mg subcutaneously weekly. Issues reviewed: blood sugar goals, complications of diabetes mellitus, hypoglycemia prevention and treatment, exercise, and nutrition.  Plan:  Continue Mounjaro 7.5 mg subcutaneously weekly.  Will refill today. The patient will continue to focus on protein-rich, low simple carbohydrate foods. We reviewed the importance of hydration, regular exercise for stress reduction, and restorative sleep.   Lab Results  Component Value Date   HGBA1C 6.0 (H) 08/09/2020   HGBA1C 8.2 (H) 04/29/2020   HGBA1C 6.2 (H) 03/19/2019   Lab Results  Component Value Date   LDLCALC 129 (H) 08/09/2020   CREATININE 0.84 11/25/2020   - Refill tirzepatide (MOUNJARO) 7.5 MG/0.5ML Pen; Inject  7.5 mg into the skin once a week.  Dispense: 2 mL; Refill: 0  2. Hypertension associated with type 2 diabetes mellitus ([place]) At goal. Medications: Hyzaar 50-12.5 mg daily.   Plan: Avoid buying foods that are: processed, frozen, or prepackaged to avoid excess salt. We will watch for signs of hypotension as she continues lifestyle modifications.  BP Readings from Last 3 Encounters:  01/02/21 116/75  12/08/20 115/76  12/05/20 131/85   Lab Results  Component Value Date   CREATININE 0.84 11/25/2020   3. Grief Linda Olson's father died a few weeks ago.  Discussed cues and consequences, how thoughts affect eating, model of thoughts, feelings, and behaviors, and strategies for change by focusing on the cue. Discussed cognitive distortions, coping thoughts, and how to change your thoughts.  4. Obesity, current BMI 42.58  [name] is currently in the action stage of change. As such, her goal is to continue with weight loss efforts. She has agreed to the Category 2 Plan.   Exercise goals:  As is.  Behavioral modification strategies: increasing lean protein intake, decreasing simple carbohydrates, increasing vegetables, and increasing water intake.  Linda Olson has agreed to follow-up with our clinic in 3 weeks. She was informed of the importance of frequent follow-up visits to maximize her success with intensive lifestyle modifications for her multiple health conditions.  Objective:   VITALS: Per patient if applicable, see vitals. GENERAL: Alert and in no acute distress. CARDIOPULMONARY: No increased WOB. Speaking in clear sentences.  PSYCH: Pleasant and cooperative. Speech normal rate and rhythm. Affect is appropriate. Insight and judgement are appropriate. Attention is focused, linear, and appropriate.  NEURO: Oriented as arrived to appointment on time with no  prompting.   Lab Results  Component Value Date   CREATININE 0.84 11/25/2020   BUN 16 11/25/2020   NA 137 11/25/2020   K 4.3  11/25/2020   CL 104 11/25/2020   CO2 26 11/25/2020   Lab Results  Component Value Date   ALT 62 (H) 11/25/2020   AST 33 11/25/2020   ALKPHOS 98 11/25/2020   BILITOT 0.4 11/25/2020   Lab Results  Component Value Date   HGBA1C 6.0 (H) 08/09/2020   HGBA1C 8.2 (H) 04/29/2020   HGBA1C 6.2 (H) 03/19/2019   Lab Results  Component Value Date   INSULIN 16.0 08/09/2020   INSULIN 22.1 04/29/2020   Lab Results  Component Value Date   TSH 1.145 11/25/2020   Lab Results  Component Value Date   CHOL 193 08/09/2020   HDL 51 08/09/2020   LDLCALC 129 (H) 08/09/2020   TRIG 68 08/09/2020   CHOLHDL 3.8 08/09/2020   Lab Results  Component Value Date   VD25OH 65.9 08/09/2020   VD25OH 21.9 (L) 04/29/2020   Lab Results  Component Value Date   WBC 7.1 11/25/2020   HGB 11.9 (L) 11/25/2020   HCT 38.1 11/25/2020   MCV 91.8 11/25/2020   PLT 363 11/25/2020   Lab Results  Component Value Date   IRON 69 04/29/2020   TIBC 311 04/29/2020   FERRITIN 100 04/29/2020   Attestation Statements:   Reviewed by clinician on day of visit: allergies, medications, problem list, medical history, surgical history, family history, social history, and previous encounter notes.  I, Water quality scientist, CMA, am acting as transcriptionist for Briscoe Deutscher, DO  I have reviewed the above documentation for accuracy and completeness, and I agree with the above. - Briscoe Deutscher, DO, MS, FAAFP, DABOM - Family and Bariatric Medicine.

## 2021-03-07 ENCOUNTER — Other Ambulatory Visit (HOSPITAL_COMMUNITY): Payer: Self-pay

## 2021-03-07 ENCOUNTER — Ambulatory Visit (INDEPENDENT_AMBULATORY_CARE_PROVIDER_SITE_OTHER): Payer: BC Managed Care – PPO | Admitting: Physician Assistant

## 2021-03-07 ENCOUNTER — Encounter (INDEPENDENT_AMBULATORY_CARE_PROVIDER_SITE_OTHER): Payer: Self-pay | Admitting: Physician Assistant

## 2021-03-07 ENCOUNTER — Other Ambulatory Visit: Payer: Self-pay

## 2021-03-07 VITALS — BP 151/88 | HR 72 | Temp 98.1°F | Ht 60.0 in | Wt 218.0 lb

## 2021-03-07 DIAGNOSIS — Z9189 Other specified personal risk factors, not elsewhere classified: Secondary | ICD-10-CM

## 2021-03-07 DIAGNOSIS — Z6841 Body Mass Index (BMI) 40.0 and over, adult: Secondary | ICD-10-CM

## 2021-03-07 DIAGNOSIS — E1169 Type 2 diabetes mellitus with other specified complication: Secondary | ICD-10-CM

## 2021-03-07 MED ORDER — TIRZEPATIDE 7.5 MG/0.5ML ~~LOC~~ SOAJ
7.5000 mg | SUBCUTANEOUS | 0 refills | Status: DC
  Filled 2021-03-07 – 2021-03-24 (×2): qty 2, 28d supply, fill #0

## 2021-03-07 NOTE — Progress Notes (Signed)
Chief Complaint:   OBESITY Linda Olson is here to discuss her progress with her obesity treatment plan along with follow-up of her obesity related diagnoses. Linda Olson is on the Category 2 Plan and states she is following her eating plan approximately 0% of the time. Linda Olson states she is not currently exercising.  Today's visit was #: 12 Starting weight: 237 lbs Starting date: 04/28/2020 Today's weight: 218 lbs Today's date: 03/07/2021 Total lbs lost to date: 19 Total lbs lost since last in-office visit: 0  Interim History: She is surprised that she didn't gain weight over the holiday and with her father's recent death. She is going to start working out at [employer].  Subjective:   1. Type 2 diabetes mellitus with other specified complication, without long-term current use of insulin (HCC) Pt's last A1c was 6.0. She had one week of Mounjaro 7.5 mg, then ran out. Pt tolerating Mounjaro well.  2. At risk for heart disease Linda Olson is at a higher than average risk for cardiovascular disease due to obesity.   Assessment/Plan:   1. Type 2 diabetes mellitus with other specified complication, without long-term current use of insulin (HCC) Good blood sugar control is important to decrease the likelihood of diabetic complications such as nephropathy, neuropathy, limb loss, blindness, coronary artery disease, and death. Intensive lifestyle modification including diet, exercise and weight loss are the first line of treatment for diabetes.   Refill- tirzepatide (MOUNJARO) 7.5 MG/0.5ML Pen; Inject 7.5 mg into the skin once a week.  Dispense: 2 mL; Refill: 0  2. At risk for heart disease Linda Olson was given approximately 15 minutes of coronary artery disease prevention counseling today. She is 61 y.o. female and has risk factors for heart disease including obesity. We discussed intensive lifestyle modifications today with an emphasis on specific weight loss instructions and  strategies.   Repetitive spaced learning was employed today to elicit superior memory formation and behavioral change.  3. Obesity, current BMI 42.58  Levia is currently in the action stage of change. As such, her goal is to continue with weight loss efforts. She has agreed to the Category 2 Plan.   Exercise goals: All adults should avoid inactivity. Some physical activity is better than none, and adults who participate in any amount of physical activity gain some health benefits.  Behavioral modification strategies: meal planning and cooking strategies and planning for success.  Linda Olson has agreed to follow-up with our clinic in 3 weeks. She was informed of the importance of frequent follow-up visits to maximize her success with intensive lifestyle modifications for her multiple health conditions.   Objective:   Blood pressure (!) 151/88, pulse 72, temperature 98.1 F (36.7 C), height 5' (1.524 m), weight 218 lb (98.9 kg), SpO2 100 %. Body mass index is 42.58 kg/m.  General: Cooperative, alert, well developed, in no acute distress. HEENT: Conjunctivae and lids unremarkable. Cardiovascular: Regular rhythm.  Lungs: Normal work of breathing. Neurologic: No focal deficits.   Lab Results  Component Value Date   CREATININE 0.84 11/25/2020   BUN 16 11/25/2020   NA 137 11/25/2020   K 4.3 11/25/2020   CL 104 11/25/2020   CO2 26 11/25/2020   Lab Results  Component Value Date   ALT 62 (H) 11/25/2020   AST 33 11/25/2020   ALKPHOS 98 11/25/2020   BILITOT 0.4 11/25/2020   Lab Results  Component Value Date   HGBA1C 6.0 (H) 08/09/2020   HGBA1C 8.2 (H) 04/29/2020   HGBA1C 6.2 (  H) 03/19/2019   Lab Results  Component Value Date   INSULIN 16.0 08/09/2020   INSULIN 22.1 04/29/2020   Lab Results  Component Value Date   TSH 1.145 11/25/2020   Lab Results  Component Value Date   CHOL 193 08/09/2020   HDL 51 08/09/2020   LDLCALC 129 (H) 08/09/2020   TRIG 68 08/09/2020    CHOLHDL 3.8 08/09/2020   Lab Results  Component Value Date   VD25OH 65.9 08/09/2020   VD25OH 21.9 (L) 04/29/2020   Lab Results  Component Value Date   WBC 7.1 11/25/2020   HGB 11.9 (L) 11/25/2020   HCT 38.1 11/25/2020   MCV 91.8 11/25/2020   PLT 363 11/25/2020   Lab Results  Component Value Date   IRON 69 04/29/2020   TIBC 311 04/29/2020   FERRITIN 100 04/29/2020    Attestation Statements:   Reviewed by clinician on day of visit: allergies, medications, problem list, medical history, surgical history, family history, social history, and previous encounter notes.  Coral Ceo, CMA, am acting as transcriptionist for [employer], PA-C.  I have reviewed the above documentation for accuracy and completeness, and I agree with the above. Abby Potash, PA-C

## 2021-03-24 ENCOUNTER — Other Ambulatory Visit (HOSPITAL_COMMUNITY): Payer: Self-pay

## 2021-03-30 ENCOUNTER — Encounter (INDEPENDENT_AMBULATORY_CARE_PROVIDER_SITE_OTHER): Payer: Self-pay | Admitting: Family Medicine

## 2021-03-30 ENCOUNTER — Ambulatory Visit (INDEPENDENT_AMBULATORY_CARE_PROVIDER_SITE_OTHER): Payer: BC Managed Care – PPO | Admitting: Family Medicine

## 2021-03-30 ENCOUNTER — Other Ambulatory Visit (HOSPITAL_COMMUNITY): Payer: Self-pay

## 2021-03-30 ENCOUNTER — Other Ambulatory Visit: Payer: Self-pay

## 2021-03-30 VITALS — BP 132/81 | HR 72 | Temp 97.8°F | Ht 60.0 in | Wt 219.0 lb

## 2021-03-30 DIAGNOSIS — Z862 Personal history of diseases of the blood and blood-forming organs and certain disorders involving the immune mechanism: Secondary | ICD-10-CM | POA: Diagnosis not present

## 2021-03-30 DIAGNOSIS — I2510 Atherosclerotic heart disease of native coronary artery without angina pectoris: Secondary | ICD-10-CM | POA: Insufficient documentation

## 2021-03-30 DIAGNOSIS — Z6841 Body Mass Index (BMI) 40.0 and over, adult: Secondary | ICD-10-CM

## 2021-03-30 DIAGNOSIS — E1159 Type 2 diabetes mellitus with other circulatory complications: Secondary | ICD-10-CM | POA: Diagnosis not present

## 2021-03-30 DIAGNOSIS — E785 Hyperlipidemia, unspecified: Secondary | ICD-10-CM

## 2021-03-30 DIAGNOSIS — E041 Nontoxic single thyroid nodule: Secondary | ICD-10-CM | POA: Diagnosis not present

## 2021-03-30 DIAGNOSIS — I152 Hypertension secondary to endocrine disorders: Secondary | ICD-10-CM

## 2021-03-30 DIAGNOSIS — E1169 Type 2 diabetes mellitus with other specified complication: Secondary | ICD-10-CM

## 2021-03-30 DIAGNOSIS — I712 Thoracic aortic aneurysm, without rupture, unspecified: Secondary | ICD-10-CM | POA: Insufficient documentation

## 2021-03-30 DIAGNOSIS — R7989 Other specified abnormal findings of blood chemistry: Secondary | ICD-10-CM | POA: Insufficient documentation

## 2021-03-30 DIAGNOSIS — E669 Obesity, unspecified: Secondary | ICD-10-CM

## 2021-03-30 DIAGNOSIS — E559 Vitamin D deficiency, unspecified: Secondary | ICD-10-CM

## 2021-03-30 MED ORDER — TIRZEPATIDE 10 MG/0.5ML ~~LOC~~ SOAJ
10.0000 mg | SUBCUTANEOUS | 1 refills | Status: DC
  Filled 2021-03-30 – 2021-04-10 (×2): qty 2, 28d supply, fill #0

## 2021-03-31 LAB — T4, FREE: Free T4: 1.22 ng/dL (ref 0.82–1.77)

## 2021-03-31 LAB — COMPREHENSIVE METABOLIC PANEL
ALT: 33 IU/L — ABNORMAL HIGH (ref 0–32)
AST: 24 IU/L (ref 0–40)
Albumin/Globulin Ratio: 1.2 (ref 1.2–2.2)
Albumin: 4.1 g/dL (ref 3.8–4.9)
Alkaline Phosphatase: 134 IU/L — ABNORMAL HIGH (ref 44–121)
BUN/Creatinine Ratio: 26 — ABNORMAL HIGH (ref 9–23)
BUN: 24 mg/dL (ref 6–24)
Bilirubin Total: 0.3 mg/dL (ref 0.0–1.2)
CO2: 21 mmol/L (ref 20–29)
Calcium: 9.6 mg/dL (ref 8.7–10.2)
Chloride: 102 mmol/L (ref 96–106)
Creatinine, Ser: 0.93 mg/dL (ref 0.57–1.00)
Globulin, Total: 3.4 g/dL (ref 1.5–4.5)
Glucose: 92 mg/dL (ref 70–99)
Potassium: 4.8 mmol/L (ref 3.5–5.2)
Sodium: 140 mmol/L (ref 134–144)
Total Protein: 7.5 g/dL (ref 6.0–8.5)
eGFR: 71 mL/min/{1.73_m2} (ref >59)

## 2021-03-31 LAB — CBC WITH DIFFERENTIAL/PLATELET
Basophils Absolute: 0 10*3/uL (ref 0.0–0.2)
Basos: 1 %
EOS (ABSOLUTE): 0.2 10*3/uL (ref 0.0–0.4)
Eos: 4 %
Hemoglobin: 12.4 g/dL (ref 11.1–15.9)
Immature Grans (Abs): 0 10*3/uL (ref 0.0–0.1)
Immature Granulocytes: 0 %
Lymphocytes Absolute: 1 10*3/uL (ref 0.7–3.1)
Lymphs: 19 %
MCH: 28.9 pg (ref 26.6–33.0)
MCHC: 33.4 g/dL (ref 31.5–35.7)
MCV: 87 fL (ref 79–97)
Monocytes Absolute: 0.5 10*3/uL (ref 0.1–0.9)
Monocytes: 9 %
Neutrophils Absolute: 3.6 10*3/uL (ref 1.4–7.0)
Neutrophils: 67 %
Platelets: 366 10*3/uL (ref 150–450)
RBC: 4.29 x10E6/uL (ref 3.77–5.28)
RDW: 12.7 % (ref 11.7–15.4)
WBC: 5.3 10*3/uL (ref 3.4–10.8)

## 2021-03-31 LAB — ANEMIA PANEL
Ferritin: 157 ng/mL — ABNORMAL HIGH (ref 15–150)
Folate, Hemolysate: 355 ng/mL
Folate, RBC: 957 ng/mL (ref >498)
Hematocrit: 37.1 % (ref 34.0–46.6)
Iron Saturation: 18 % (ref 15–55)
Iron: 53 ug/dL (ref 27–159)
Retic Ct Pct: 1.6 % (ref 0.6–2.6)
Total Iron Binding Capacity: 298 ug/dL (ref 250–450)
UIBC: 245 ug/dL (ref 131–425)
Vitamin B-12: 701 pg/mL (ref 232–1245)

## 2021-03-31 LAB — TSH: TSH: 1.38 u[IU]/mL (ref 0.450–4.500)

## 2021-03-31 LAB — HEMOGLOBIN A1C
Est. average glucose Bld gHb Est-mCnc: 131 mg/dL
Hgb A1c MFr Bld: 6.2 % — ABNORMAL HIGH (ref 4.8–5.6)

## 2021-03-31 LAB — LIPID PANEL
Chol/HDL Ratio: 4.3 ratio (ref 0.0–4.4)
Cholesterol, Total: 213 mg/dL — ABNORMAL HIGH (ref 100–199)
HDL: 49 mg/dL (ref >39)
LDL Chol Calc (NIH): 150 mg/dL — ABNORMAL HIGH (ref 0–99)
Triglycerides: 81 mg/dL (ref 0–149)
VLDL Cholesterol Cal: 14 mg/dL (ref 5–40)

## 2021-03-31 LAB — VITAMIN D 25 HYDROXY (VIT D DEFICIENCY, FRACTURES): Vit D, 25-Hydroxy: 40.3 ng/mL (ref 30.0–100.0)

## 2021-04-03 NOTE — Progress Notes (Signed)
Chief Complaint:   OBESITY [name] is here to discuss her progress with her obesity treatment plan along with follow-up of her obesity related diagnoses. See Medical Weight Management Flowsheet for complete bioelectrical impedance results.  Today's visit was #: 21 Starting weight: 237 lbs Starting date: 04/28/2020 Weight change since last visit: +1 lb Total lbs lost to date: 18 lbs Total weight loss percentage to date: -7.59%  Nutrition Plan: Category 2 Plan for 50% of the time.  Activity: None. Anti-obesity medications: Mounjaro 7.5 mg subcutaneously weekly. Reported side effects: None.  Interim History: Linda Olson has had no Mounjaro for 2 weeks (pharmacy back order).  She admits to having a hard time saying no to family and friends.  Exhausts herself and limits self-care focus.  Assessment/Plan:   1. Type 2 diabetes mellitus with other specified complication, without long-term current use of insulin (HCC) Diabetes Mellitus: Not at goal. Medication: Mounjaro 7.5 mg subcutaneously weekly. Issues reviewed: blood sugar goals, complications of diabetes mellitus, hypoglycemia prevention and treatment, exercise, and nutrition.  Plan:  Increase Mounjaro to 10 mg subcutaneously weekly. The patient will continue to focus on protein-rich, low simple carbohydrate foods. We reviewed the importance of hydration, regular exercise for stress reduction, and restorative sleep. Will check labs today.  Lab Results  Component Value Date   HGBA1C 6.2 (H) 03/30/2021   HGBA1C 6.0 (H) 08/09/2020   HGBA1C 8.2 (H) 04/29/2020   Lab Results  Component Value Date   LDLCALC 150 (H) 03/30/2021   CREATININE 0.93 03/30/2021   - Comprehensive metabolic panel - Hemoglobin A1c - Increase tirzepatide (MOUNJARO) 10 MG/0.5ML Pen; Inject 10 mg into the skin once a week.  Dispense: 6 mL; Refill: 1  2. Hyperlipidemia associated with type 2 diabetes mellitus ([place]) Course: Not at goal. Lipid-lowering  medications: Lipitor 40 mg daily.   Plan: Dietary changes: Increase soluble fiber, decrease simple carbohydrates, decrease saturated fat. Exercise changes: Moderate to vigorous-intensity aerobic activity 150 minutes per week or as tolerated. We will continue to monitor along with PCP/specialists as it pertains to her weight loss journey.  Will check lipid panel today.  Lab Results  Component Value Date   CHOL 213 (H) 03/30/2021   HDL 49 03/30/2021   LDLCALC 150 (H) 03/30/2021   TRIG 81 03/30/2021   CHOLHDL 4.3 03/30/2021   Lab Results  Component Value Date   ALT 33 (H) 03/30/2021   AST 24 03/30/2021   ALKPHOS 134 (H) 03/30/2021   BILITOT 0.3 03/30/2021   The 10-year ASCVD risk score (Arnett DK, et al., 2019) is: 17.3%   Values used to calculate the score:     Age: 71 years     Sex: Female     Is Non-Hispanic African American: Yes     Diabetic: Yes     Tobacco smoker: No     Systolic Blood Pressure: 875 mmHg     Is BP treated: Yes     HDL Cholesterol: 49 mg/dL     Total Cholesterol: 213 mg/dL  - Lipid panel  3. Hypertension associated with type 2 diabetes mellitus ([place]) At goal. Medications: Hyzaar 50-12.5 mg daily.   Plan: Avoid buying foods that are: processed, frozen, or prepackaged to avoid excess salt. We will watch for signs of hypotension as she continues lifestyle modifications.  BP Readings from Last 3 Encounters:  03/30/21 132/81  03/07/21 (!) 151/88  01/02/21 116/75   Lab Results  Component Value Date   CREATININE 0.93 03/30/2021  4. Coronary artery disease involving native coronary artery of native heart without angina pectoris We will continue to monitor as it relates to her weight loss journey.  5. Thoracic aortic aneurysm without rupture We will continue to monitor as it relates to her weight loss journey.  6. Abnormal liver function tests Elevated liver transaminases with an ALT predominance combined with obesity and insulin resistance is  characteristic, but not diagnostic of non-alcoholic fatty liver disease (NAFLD). NAFLD is the 2nd leading cause of liver transplant in adults. Treatment includes weight loss, elimination of sweet drinks, including juice, avoidance of high fructose corn syrup, and exercise. As always, avoiding alcohol consumption is important.  Lab Results  Component Value Date   ALT 33 (H) 03/30/2021   AST 24 03/30/2021   ALKPHOS 134 (H) 03/30/2021   BILITOT 0.3 03/30/2021   7. Thyroid nodule Will check TSH and free T4 today, as per below.  - TSH - T4, free  8. Vitamin D deficiency Not at goal.   Plan: Will check vitamin D level today.  Lab Results  Component Value Date   VD25OH 40.3 03/30/2021   VD25OH 65.9 08/09/2020   VD25OH 21.9 (L) 04/29/2020   - VITAMIN D 25 Hydroxy (Vit-D Deficiency, Fractures)  9. History of anemia Will check anemia panel and CBC with diff today.  - Anemia panel - CBC with Differential/Platelet  10. Obesity, current BMI 42.9  Course: [name] is currently in the action stage of change. As such, her goal is to continue with weight loss efforts.   Nutrition goals: She has agreed to the Category 2 Plan.   Exercise goals:  Increase NEAT.  Behavioral modification strategies: increasing lean protein intake, decreasing simple carbohydrates, increasing vegetables, and increasing water intake.  Linda Olson has agreed to follow-up with our clinic in 4 weeks. She was informed of the importance of frequent follow-up visits to maximize her success with intensive lifestyle modifications for her multiple health conditions.   Objective:   Blood pressure 132/81, pulse 72, temperature 97.8 F (36.6 C), temperature source Oral, height 5' (1.524 m), weight 219 lb (99.3 kg), SpO2 96 %. Body mass index is 42.77 kg/m.  General: Cooperative, alert, well developed, in no acute distress. HEENT: Conjunctivae and lids unremarkable. Cardiovascular: Regular rhythm.  Lungs: Normal  work of breathing. Neurologic: No focal deficits.   Lab Results  Component Value Date   CREATININE 0.93 03/30/2021   BUN 24 03/30/2021   NA 140 03/30/2021   K 4.8 03/30/2021   CL 102 03/30/2021   CO2 21 03/30/2021   Lab Results  Component Value Date   ALT 33 (H) 03/30/2021   AST 24 03/30/2021   ALKPHOS 134 (H) 03/30/2021   BILITOT 0.3 03/30/2021   Lab Results  Component Value Date   HGBA1C 6.2 (H) 03/30/2021   HGBA1C 6.0 (H) 08/09/2020   HGBA1C 8.2 (H) 04/29/2020   HGBA1C 6.2 (H) 03/19/2019   Lab Results  Component Value Date   INSULIN 16.0 08/09/2020   INSULIN 22.1 04/29/2020   Lab Results  Component Value Date   TSH 1.380 03/30/2021   Lab Results  Component Value Date   CHOL 213 (H) 03/30/2021   HDL 49 03/30/2021   LDLCALC 150 (H) 03/30/2021   TRIG 81 03/30/2021   CHOLHDL 4.3 03/30/2021   Lab Results  Component Value Date   VD25OH 40.3 03/30/2021   VD25OH 65.9 08/09/2020   VD25OH 21.9 (L) 04/29/2020   Lab Results  Component Value Date  WBC 5.3 03/30/2021   HGB 12.4 03/30/2021   HCT 37.1 03/30/2021   MCV 87 03/30/2021   PLT 366 03/30/2021   Lab Results  Component Value Date   IRON 53 03/30/2021   TIBC 298 03/30/2021   FERRITIN 157 (H) 03/30/2021   Attestation Statements:   Reviewed by clinician on day of visit: allergies, medications, problem list, medical history, surgical history, family history, social history, and previous encounter notes.  I, Water quality scientist, CMA, am acting as transcriptionist for Briscoe Deutscher, DO  I have reviewed the above documentation for accuracy and completeness, and I agree with the above. -  Briscoe Deutscher, DO, MS, FAAFP, DABOM - Family and Bariatric Medicine.

## 2021-04-08 ENCOUNTER — Other Ambulatory Visit (HOSPITAL_COMMUNITY): Payer: Self-pay

## 2021-04-09 ENCOUNTER — Other Ambulatory Visit: Payer: Self-pay | Admitting: Orthopaedic Surgery

## 2021-04-10 ENCOUNTER — Other Ambulatory Visit (HOSPITAL_COMMUNITY): Payer: Self-pay

## 2021-04-11 ENCOUNTER — Other Ambulatory Visit (HOSPITAL_COMMUNITY): Payer: Self-pay

## 2021-04-17 ENCOUNTER — Other Ambulatory Visit (HOSPITAL_COMMUNITY): Payer: Self-pay

## 2021-04-20 ENCOUNTER — Other Ambulatory Visit: Payer: Self-pay

## 2021-04-20 ENCOUNTER — Other Ambulatory Visit (HOSPITAL_COMMUNITY): Payer: Self-pay

## 2021-04-20 ENCOUNTER — Other Ambulatory Visit (INDEPENDENT_AMBULATORY_CARE_PROVIDER_SITE_OTHER): Payer: Self-pay | Admitting: Family Medicine

## 2021-04-20 ENCOUNTER — Encounter (INDEPENDENT_AMBULATORY_CARE_PROVIDER_SITE_OTHER): Payer: Self-pay | Admitting: Family Medicine

## 2021-04-20 ENCOUNTER — Ambulatory Visit (INDEPENDENT_AMBULATORY_CARE_PROVIDER_SITE_OTHER): Payer: BC Managed Care – PPO | Admitting: Family Medicine

## 2021-04-20 DIAGNOSIS — E1169 Type 2 diabetes mellitus with other specified complication: Secondary | ICD-10-CM

## 2021-04-20 DIAGNOSIS — E1159 Type 2 diabetes mellitus with other circulatory complications: Secondary | ICD-10-CM

## 2021-04-20 DIAGNOSIS — Z7985 Long-term (current) use of injectable non-insulin antidiabetic drugs: Secondary | ICD-10-CM

## 2021-04-20 DIAGNOSIS — E785 Hyperlipidemia, unspecified: Secondary | ICD-10-CM | POA: Diagnosis not present

## 2021-04-20 DIAGNOSIS — Z6841 Body Mass Index (BMI) 40.0 and over, adult: Secondary | ICD-10-CM

## 2021-04-20 DIAGNOSIS — I152 Hypertension secondary to endocrine disorders: Secondary | ICD-10-CM | POA: Diagnosis not present

## 2021-04-20 DIAGNOSIS — Z9189 Other specified personal risk factors, not elsewhere classified: Secondary | ICD-10-CM | POA: Diagnosis not present

## 2021-04-20 MED ORDER — TIRZEPATIDE 7.5 MG/0.5ML ~~LOC~~ SOAJ
7.5000 mg | SUBCUTANEOUS | 0 refills | Status: DC
Start: 2021-04-20 — End: 2021-06-01
  Filled 2021-04-20: qty 2, 28d supply, fill #0

## 2021-04-20 NOTE — Progress Notes (Signed)
Chief Complaint:   OBESITY Linda Olson is here to discuss her progress with her obesity treatment plan along with follow-up of her obesity related diagnoses. Linda Olson is on the Category 2 Plan and states she is following her eating plan approximately 50% of the time. Linda Olson states she is walking for 30 minutes 6 times per week.  Today's visit was #: 14 Starting weight: 237 lbs Starting date: 04/28/2020 Today's weight: 217 lbs Today's date: 04/20/2021 Total lbs lost to date: 20 Total lbs lost since last in-office visit: 2  Interim History: Linda Olson is doing well with weight loss. Her concern since her last visit is the shortage of Mounjaro. She is struggling to find Mounjaro 10 mg. She is skipping meals at times. She is sleeping 3-6 hours per night. She struggles with her Category 2 plan on the weekends.  Subjective:   1. Hypertension associated with type 2 diabetes mellitus ([place]) Linda Olson is taking Hyzaar 50-12.5 mg mg. She denies side effects. She notes some headache upon awakening. She has a blood pressure cuff at home. She randomly checks her blood pressure at home, but she doesn't remember the numbers. I discussed labs with the patient today.  2. Hyperlipidemia associated with type 2 diabetes mellitus ([place]) Linda Olson is not sure if she is taking Lipitor 40 mg. I discussed labs with the patient today.  3. Type 2 diabetes mellitus with other specified complication, without long-term current use of insulin (HCC) Linda Olson's last A1c was 6.2. She is taking Mounjaro 7.5 mg, and she denies side effects. She can't find Mounjaro at 10 mg dose. I discussed labs with the patient today.  4. At risk for obstructive sleep apnea Linda Olson is at increased risk for sleep apnea due to sleep deprivation, snoring, and daytime sleepiness.   Assessment/Plan:   1. Hypertension associated with type 2 diabetes mellitus ([place]) Shanora will continue Hyzaar, and she is to check her blood pressure  at home, especially check if headache is noted.  2. Hyperlipidemia associated with type 2 diabetes mellitus ([place]) Linda Olson will check her medications when she gets home to see if she is taking Lipitor, and she is to let us know. She will continue working on dietary changes, exercise, and weight loss.  3. Type 2 diabetes mellitus with other specified complication, without long-term current use of insulin (HCC) We will refill Mounjaro 7.5 mg for 1 month. Blood glucose log was given to [place] today. Good blood sugar control is important to decrease the likelihood of diabetic complications such as nephropathy, neuropathy, limb loss, blindness, coronary artery disease, and death. Intensive lifestyle modification including diet, exercise and weight loss are the first line of treatment for diabetes.   - tirzepatide (MOUNJARO) 7.5 MG/0.5ML Pen; Inject 7.5 mg into the skin once a week.  Dispense: 2 mL; Refill: 0  4. At risk for obstructive sleep apnea Linda Olson was given approximately 15 minutes of obstructive sleep apnea risk counseling today. She has risk factors for obstructive sleep apnea. She will consider a referral for an evaluation. We discussed the importance of sleep hygiene, a healthy relationship with food and a good support system.  Repetitive spaced learning was employed today to elicit superior memory formation and behavioral change.  5. Obesity, current BMI 42.4 Linda Olson is currently in the action stage of change. As such, her goal is to continue with weight loss efforts. She has agreed to the Category 2 Plan.   200 calorie snack ideas list was given.  Exercise goals: As is.  Behavioral modification strategies: increasing lean protein intake, increasing water intake, and no skipping meals.  Linda Olson has agreed to follow-up with our clinic in 3 weeks. She was informed of the importance of frequent follow-up visits to maximize her success with intensive lifestyle modifications for  her multiple health conditions.   Objective:   There were no vitals taken for this visit. There is no height or weight on file to calculate BMI.  General: Cooperative, alert, well developed, in no acute distress. HEENT: Conjunctivae and lids unremarkable. Cardiovascular: Regular rhythm.  Lungs: Normal work of breathing. Neurologic: No focal deficits.   Lab Results  Component Value Date   CREATININE 0.93 03/30/2021   BUN 24 03/30/2021   NA 140 03/30/2021   K 4.8 03/30/2021   CL 102 03/30/2021   CO2 21 03/30/2021   Lab Results  Component Value Date   ALT 33 (H) 03/30/2021   AST 24 03/30/2021   ALKPHOS 134 (H) 03/30/2021   BILITOT 0.3 03/30/2021   Lab Results  Component Value Date   HGBA1C 6.2 (H) 03/30/2021   HGBA1C 6.0 (H) 08/09/2020   HGBA1C 8.2 (H) 04/29/2020   HGBA1C 6.2 (H) 03/19/2019   Lab Results  Component Value Date   INSULIN 16.0 08/09/2020   INSULIN 22.1 04/29/2020   Lab Results  Component Value Date   TSH 1.380 03/30/2021   Lab Results  Component Value Date   CHOL 213 (H) 03/30/2021   HDL 49 03/30/2021   LDLCALC 150 (H) 03/30/2021   TRIG 81 03/30/2021   CHOLHDL 4.3 03/30/2021   Lab Results  Component Value Date   VD25OH 40.3 03/30/2021   VD25OH 65.9 08/09/2020   VD25OH 21.9 (L) 04/29/2020   Lab Results  Component Value Date   WBC 5.3 03/30/2021   HGB 12.4 03/30/2021   HCT 37.1 03/30/2021   MCV 87 03/30/2021   PLT 366 03/30/2021   Lab Results  Component Value Date   IRON 53 03/30/2021   TIBC 298 03/30/2021   FERRITIN 157 (H) 03/30/2021   Attestation Statements:   Reviewed by clinician on day of visit: allergies, medications, problem list, medical history, surgical history, family history, social history, and previous encounter notes.   I, Trixie Dredge, am acting as transcriptionist for Dennard Nip, MD.  I have reviewed the above documentation for accuracy and completeness, and I agree with the above. -  Dennard Nip,  MD

## 2021-05-08 DIAGNOSIS — H40023 Open angle with borderline findings, high risk, bilateral: Secondary | ICD-10-CM | POA: Diagnosis not present

## 2021-05-11 ENCOUNTER — Other Ambulatory Visit (INDEPENDENT_AMBULATORY_CARE_PROVIDER_SITE_OTHER): Payer: Self-pay | Admitting: Family Medicine

## 2021-05-11 ENCOUNTER — Encounter (INDEPENDENT_AMBULATORY_CARE_PROVIDER_SITE_OTHER): Payer: Self-pay

## 2021-05-11 ENCOUNTER — Other Ambulatory Visit (HOSPITAL_COMMUNITY): Payer: Self-pay

## 2021-05-11 ENCOUNTER — Ambulatory Visit (INDEPENDENT_AMBULATORY_CARE_PROVIDER_SITE_OTHER): Payer: BC Managed Care – PPO | Admitting: Family Medicine

## 2021-05-11 DIAGNOSIS — E1169 Type 2 diabetes mellitus with other specified complication: Secondary | ICD-10-CM

## 2021-05-12 ENCOUNTER — Other Ambulatory Visit (HOSPITAL_COMMUNITY): Payer: Self-pay

## 2021-05-31 ENCOUNTER — Encounter (INDEPENDENT_AMBULATORY_CARE_PROVIDER_SITE_OTHER): Payer: Self-pay | Admitting: Physician Assistant

## 2021-05-31 ENCOUNTER — Ambulatory Visit (INDEPENDENT_AMBULATORY_CARE_PROVIDER_SITE_OTHER): Payer: BC Managed Care – PPO | Admitting: Physician Assistant

## 2021-05-31 ENCOUNTER — Other Ambulatory Visit: Payer: Self-pay

## 2021-05-31 ENCOUNTER — Other Ambulatory Visit (HOSPITAL_COMMUNITY): Payer: Self-pay

## 2021-05-31 VITALS — BP 129/79 | HR 86 | Temp 98.0°F | Ht 60.0 in | Wt 219.0 lb

## 2021-05-31 DIAGNOSIS — E669 Obesity, unspecified: Secondary | ICD-10-CM

## 2021-05-31 DIAGNOSIS — Z9189 Other specified personal risk factors, not elsewhere classified: Secondary | ICD-10-CM

## 2021-05-31 DIAGNOSIS — Z6841 Body Mass Index (BMI) 40.0 and over, adult: Secondary | ICD-10-CM

## 2021-05-31 DIAGNOSIS — E785 Hyperlipidemia, unspecified: Secondary | ICD-10-CM | POA: Diagnosis not present

## 2021-05-31 DIAGNOSIS — E1169 Type 2 diabetes mellitus with other specified complication: Secondary | ICD-10-CM | POA: Diagnosis not present

## 2021-05-31 DIAGNOSIS — Z7985 Long-term (current) use of injectable non-insulin antidiabetic drugs: Secondary | ICD-10-CM

## 2021-05-31 MED ORDER — TIRZEPATIDE 10 MG/0.5ML ~~LOC~~ SOAJ
10.0000 mg | SUBCUTANEOUS | 0 refills | Status: DC
Start: 2021-05-31 — End: 2021-06-01
  Filled 2021-05-31: qty 2, 28d supply, fill #0

## 2021-05-31 NOTE — Progress Notes (Signed)
? ? ? ?Chief Complaint:  ? ?OBESITY ?Linda Olson is here to discuss her progress with her obesity treatment plan along with follow-up of her obesity related diagnoses. Linda Olson is on the Category 2 Plan and states she is following her eating plan approximately 70% of the time. Linda Olson states she is walking for 30 minutes 3 times per week. ? ?Today's visit was #: 15 ?Starting weight: 237 lbs ?Starting date: 04/28/2020 ?Today's weight: 219 lbs ?Today's date: 05/31/2021 ?Total lbs lost to date: 18 lbs ?Total lbs lost since last in-office visit: 0 ? ?Interim History: Linda Olson has been out of [facility] for 3 weeks and her sweet cravings have returned. She skips lunch on Tuesdays due to a church commitment. Weekends can be a struggle because she wakes up late and skips lunch due to running errands.  ? ?Subjective:  ? ?1. Type 2 diabetes mellitus with other specified complication, without long-term current use of insulin (Linda Olson) ?Linda Olson's last A1C was 6.2. She is on Linda Olson, but been out for 3 weeks. Her cravings for sweets are increased.  ? ?2. Hyperlipidemia associated with type 2 diabetes mellitus (Linda Olson) ?Linda Olson last lipid panel was not at goal.  ? ?3. At risk for heart disease ?Linda Olson is at higher than average risk for cardiovascular disease due to obesity.  ? ?Assessment/Plan:  ? ?1. Type 2 diabetes mellitus with other specified complication, without long-term current use of insulin (Linda Olson) ?Linda Olson agrees to increase Mounjaro to 10 mg for 1 month with no refills. Good blood sugar control is important to decrease the likelihood of diabetic complications such as nephropathy, neuropathy, limb loss, blindness, coronary artery disease, and death. Intensive lifestyle modification including diet, exercise and weight loss are the first line of treatment for diabetes.  ? ?- tirzepatide (MOUNJARO) 10 MG/0.5ML Pen; Inject 10 mg into the skin once a week.  Dispense: 2 mL; Refill: 0 ? ?2. Hyperlipidemia associated with  type 2 diabetes mellitus (Linda Olson) ?Cardiovascular risk and specific lipid/LDL goals reviewed.  Linda Olson will decrease fried foods and high fat meats. We discussed several lifestyle modifications today and Caitland will continue to work on diet, exercise and weight loss efforts. Orders and follow up as documented in patient record.  ? ?Counseling ?Intensive lifestyle modifications are the first line treatment for this issue. ?Dietary changes: Increase soluble fiber. Decrease simple carbohydrates. ?Exercise changes: Moderate to vigorous-intensity aerobic activity 150 minutes per week if tolerated. ?Lipid-lowering medications: see documented in medical record. ? ?3. At risk for heart disease ?Linda Olson was given approximately 15 minutes of coronary artery disease prevention counseling today. She is 61 y.o. female and has risk factors for heart disease including obesity. We discussed intensive lifestyle modifications today with an emphasis on specific weight loss instructions and strategies. ? ?Repetitive spaced learning was employed today to elicit superior memory formation and behavioral change.   ? ?4. Obesity, current BMI 42.4 ?Linda Olson is currently in the action stage of change. As such, her goal is to continue with weight loss efforts. She has agreed to the Category 2 Plan.  ? ?Exercise goals:  As is. ? ?Behavioral modification strategies: meal planning and cooking strategies and keeping healthy foods in the home. ? ?Linda Olson has agreed to follow-up with our clinic in 2 weeks. She was informed of the importance of frequent follow-up visits to maximize her success with intensive lifestyle modifications for her multiple health conditions.  ? ?Objective:  ? ?Blood pressure 129/79, pulse 86, temperature 98 ?F (36.7 ?C), height 5' (1.524 m), weight 219  lb (99.3 kg), SpO2 99 %. ?Body mass index is 42.77 kg/m?. ? ?General: Cooperative, alert, well developed, in no acute distress. ?HEENT: Conjunctivae and lids  unremarkable. ?Cardiovascular: Regular rhythm.  ?Lungs: Normal work of breathing. ?Neurologic: No focal deficits.  ? ?Lab Results  ?Component Value Date  ? CREATININE 0.93 03/30/2021  ? BUN 24 03/30/2021  ? NA 140 03/30/2021  ? K 4.8 03/30/2021  ? CL 102 03/30/2021  ? CO2 21 03/30/2021  ? ?Lab Results  ?Component Value Date  ? ALT 33 (H) 03/30/2021  ? AST 24 03/30/2021  ? ALKPHOS 134 (H) 03/30/2021  ? BILITOT 0.3 03/30/2021  ? ?Lab Results  ?Component Value Date  ? HGBA1C 6.2 (H) 03/30/2021  ? HGBA1C 6.0 (H) 08/09/2020  ? HGBA1C 8.2 (H) 04/29/2020  ? HGBA1C 6.2 (H) 03/19/2019  ? ?Lab Results  ?Component Value Date  ? INSULIN 16.0 08/09/2020  ? INSULIN 22.1 04/29/2020  ? ?Lab Results  ?Component Value Date  ? TSH 1.380 03/30/2021  ? ?Lab Results  ?Component Value Date  ? CHOL 213 (H) 03/30/2021  ? HDL 49 03/30/2021  ? [place] 150 (H) 03/30/2021  ? TRIG 81 03/30/2021  ? CHOLHDL 4.3 03/30/2021  ? ?Lab Results  ?Component Value Date  ? VD25OH 40.3 03/30/2021  ? VD25OH 65.9 08/09/2020  ? VD25OH 21.9 (L) 04/29/2020  ? ?Lab Results  ?Component Value Date  ? WBC 5.3 03/30/2021  ? HGB 12.4 03/30/2021  ? HCT 37.1 03/30/2021  ? MCV 87 03/30/2021  ? PLT 366 03/30/2021  ? ?Lab Results  ?Component Value Date  ? IRON 53 03/30/2021  ? TIBC 298 03/30/2021  ? FERRITIN 157 (H) 03/30/2021  ? ?Attestation Statements:  ? ?Reviewed by clinician on day of visit: allergies, medications, problem list, medical history, surgical history, family history, social history, and previous encounter notes. ? ?I, Tonye Pearson, am acting as Location manager for [employer], PA-C. ? ?I have reviewed the above documentation for accuracy and completeness, and I agree with the above. Abby Potash, PA-C ? ?

## 2021-06-01 ENCOUNTER — Other Ambulatory Visit (INDEPENDENT_AMBULATORY_CARE_PROVIDER_SITE_OTHER): Payer: Self-pay | Admitting: Physician Assistant

## 2021-06-01 ENCOUNTER — Other Ambulatory Visit (INDEPENDENT_AMBULATORY_CARE_PROVIDER_SITE_OTHER): Payer: Self-pay | Admitting: Bariatrics

## 2021-06-01 ENCOUNTER — Other Ambulatory Visit (HOSPITAL_COMMUNITY): Payer: Self-pay

## 2021-06-01 DIAGNOSIS — E1169 Type 2 diabetes mellitus with other specified complication: Secondary | ICD-10-CM

## 2021-06-01 MED ORDER — TIRZEPATIDE 12.5 MG/0.5ML ~~LOC~~ SOAJ
12.5000 mg | SUBCUTANEOUS | 0 refills | Status: DC
  Filled 2021-06-01: qty 2, 28d supply, fill #0

## 2021-06-02 ENCOUNTER — Other Ambulatory Visit (HOSPITAL_COMMUNITY): Payer: Self-pay

## 2021-06-15 ENCOUNTER — Ambulatory Visit (INDEPENDENT_AMBULATORY_CARE_PROVIDER_SITE_OTHER): Payer: BC Managed Care – PPO | Admitting: Physician Assistant

## 2021-06-22 ENCOUNTER — Encounter (INDEPENDENT_AMBULATORY_CARE_PROVIDER_SITE_OTHER): Payer: Self-pay | Admitting: Physician Assistant

## 2021-06-22 ENCOUNTER — Ambulatory Visit (INDEPENDENT_AMBULATORY_CARE_PROVIDER_SITE_OTHER): Payer: BC Managed Care – PPO | Admitting: Physician Assistant

## 2021-06-22 ENCOUNTER — Other Ambulatory Visit (HOSPITAL_COMMUNITY): Payer: Self-pay

## 2021-06-22 VITALS — BP 146/82 | Ht 60.0 in | Wt 216.2 lb

## 2021-06-22 DIAGNOSIS — E1159 Type 2 diabetes mellitus with other circulatory complications: Secondary | ICD-10-CM | POA: Diagnosis not present

## 2021-06-22 DIAGNOSIS — Z6841 Body Mass Index (BMI) 40.0 and over, adult: Secondary | ICD-10-CM

## 2021-06-22 DIAGNOSIS — E1169 Type 2 diabetes mellitus with other specified complication: Secondary | ICD-10-CM | POA: Diagnosis not present

## 2021-06-22 DIAGNOSIS — E669 Obesity, unspecified: Secondary | ICD-10-CM | POA: Diagnosis not present

## 2021-06-22 DIAGNOSIS — Z7985 Long-term (current) use of injectable non-insulin antidiabetic drugs: Secondary | ICD-10-CM

## 2021-06-22 DIAGNOSIS — I152 Hypertension secondary to endocrine disorders: Secondary | ICD-10-CM

## 2021-06-22 DIAGNOSIS — Z9189 Other specified personal risk factors, not elsewhere classified: Secondary | ICD-10-CM

## 2021-06-22 MED ORDER — TIRZEPATIDE 12.5 MG/0.5ML ~~LOC~~ SOAJ
12.5000 mg | SUBCUTANEOUS | 0 refills | Status: DC
  Filled 2021-06-22: qty 2, 28d supply, fill #0

## 2021-06-24 ENCOUNTER — Other Ambulatory Visit (HOSPITAL_COMMUNITY): Payer: Self-pay

## 2021-07-02 NOTE — Progress Notes (Signed)
? ? ? ?Chief Complaint:  ? ?OBESITY ?Linda Olson is here to discuss her progress with her obesity treatment plan along with follow-up of her obesity related diagnoses. Linda Olson is on the Category 2 Plan and states she is following her eating plan approximately 65-70% of the time. Linda Olson states she is walking for30 minutes 5 times per week, and weight training for 30-40 minutes 3 times per week. ? ?Today's visit was #: 16 ?Starting weight: 237 lbs ?Starting date: 04/28/2020 ?Today's weight: 216 lbs ?Today's date: 06/22/2021 ?Total lbs lost to date: 21 ?Total lbs lost since last in-office visit: 3 ? ?Interim History: Linda Olson has been walking more often. She has been watching her portions if she is not eating on the plan. She is not skipping lunch any longer. Her hunger is controlled. ? ?Subjective:  ? ?1. Type 2 diabetes mellitus with other specified complication, without long-term current use of insulin (Linda Olson) ?Linda Olson is on Mounjaro 12.5 mg. Her appetite is well controlled. Her last A1c was 6.2. ? ?2. Hypertension associated with type 2 diabetes mellitus (Linda Olson) ?Linda Olson is on Hyzaar, and she missed one dose this week. She notes some headache in the middle of the night. ? ?3. At risk for hypoglycemia ?Linda Olson is at increased risk for hypoglycemia due to changes in diet, diagnosis of diabetes, and/or insulin use.  ? ?Assessment/Plan:  ? ?1. Type 2 diabetes mellitus with other specified complication, without long-term current use of insulin (Linda Olson) ?Athira will continue Mounjaro 12.5 mg weekly, and we will refill for 1 month. ? ?- tirzepatide (MOUNJARO) 12.5 MG/0.5ML Pen; Inject 12.5 mg into the skin once a week.  Dispense: 2 mL; Refill: 0 ? ?2. Hypertension associated with type 2 diabetes mellitus (Linda Olson) ?Linda Olson is to check her blood pressure at home, and bring in a diary of her readings. She is to take her Hyzaar as prescribed, and we will follow up at her next visit. ? ?3. At risk for  hypoglycemia ?Linda Olson was given approximately 15 minutes of counseling today regarding prevention of hypoglycemia. She was advised of symptoms of hypoglycemia. Linda Olson was instructed to avoid skipping meals, eat regular protein rich meals, and schedule low calorie snacks as needed. ? ?Repetitive spaced learning was employed today to elicit superior memory formation and behavioral change. ? ?4. Obesity, current BMI 42.4 ?Linda Olson is currently in the action stage of change. As such, her goal is to continue with weight loss efforts. She has agreed to the Category 2 Plan.  ? ?Exercise goals: As is. ? ?Behavioral modification strategies: meal planning and cooking strategies and planning for success. ? ?Linda Olson has agreed to follow-up with our clinic in 4 weeks. She was informed of the importance of frequent follow-up visits to maximize her success with intensive lifestyle modifications for her multiple health conditions.  ? ?Objective:  ? ?Blood pressure (!) 146/82, height 5' (1.524 m), weight 216 lb 3.2 oz (98.1 kg). ?Body mass index is 42.22 kg/m?. ? ?General: Cooperative, alert, well developed, in no acute distress. ?HEENT: Conjunctivae and lids unremarkable. ?Cardiovascular: Regular rhythm.  ?Lungs: Normal work of breathing. ?Neurologic: No focal deficits.  ? ?Lab Results  ?Component Value Date  ? CREATININE 0.93 03/30/2021  ? BUN 24 03/30/2021  ? NA 140 03/30/2021  ? K 4.8 03/30/2021  ? CL 102 03/30/2021  ? CO2 21 03/30/2021  ? ?Lab Results  ?Component Value Date  ? ALT 33 (H) 03/30/2021  ? AST 24 03/30/2021  ? ALKPHOS 134 (H) 03/30/2021  ? BILITOT 0.3 03/30/2021  ? ?  Lab Results  ?Component Value Date  ? HGBA1C 6.2 (H) 03/30/2021  ? HGBA1C 6.0 (H) 08/09/2020  ? HGBA1C 8.2 (H) 04/29/2020  ? HGBA1C 6.2 (H) 03/19/2019  ? ?Lab Results  ?Component Value Date  ? INSULIN 16.0 08/09/2020  ? INSULIN 22.1 04/29/2020  ? ?Lab Results  ?Component Value Date  ? TSH 1.380 03/30/2021  ? ?Lab Results  ?Component Value Date  ?  CHOL 213 (H) 03/30/2021  ? HDL 49 03/30/2021  ? [place] 150 (H) 03/30/2021  ? TRIG 81 03/30/2021  ? CHOLHDL 4.3 03/30/2021  ? ?Lab Results  ?Component Value Date  ? VD25OH 40.3 03/30/2021  ? VD25OH 65.9 08/09/2020  ? VD25OH 21.9 (L) 04/29/2020  ? ?Lab Results  ?Component Value Date  ? WBC 5.3 03/30/2021  ? HGB 12.4 03/30/2021  ? HCT 37.1 03/30/2021  ? MCV 87 03/30/2021  ? PLT 366 03/30/2021  ? ?Lab Results  ?Component Value Date  ? IRON 53 03/30/2021  ? TIBC 298 03/30/2021  ? FERRITIN 157 (H) 03/30/2021  ? ?Attestation Statements:  ? ?Reviewed by clinician on day of visit: allergies, medications, problem list, medical history, surgical history, family history, social history, and previous encounter notes. ? ? ?I, Trixie Dredge, am acting as transcriptionist for Abby Potash, PA-C. ? ?I have reviewed the above documentation for accuracy and completeness, and I agree with the above. Abby Potash, PA-C ? ? ?

## 2021-07-10 ENCOUNTER — Other Ambulatory Visit: Payer: Self-pay | Admitting: Orthopaedic Surgery

## 2021-07-19 ENCOUNTER — Ambulatory Visit (INDEPENDENT_AMBULATORY_CARE_PROVIDER_SITE_OTHER): Payer: BC Managed Care – PPO | Admitting: Family Medicine

## 2021-07-19 ENCOUNTER — Encounter (INDEPENDENT_AMBULATORY_CARE_PROVIDER_SITE_OTHER): Payer: Self-pay | Admitting: Family Medicine

## 2021-07-19 VITALS — BP 130/72 | HR 64 | Temp 98.3°F | Ht 60.0 in | Wt 209.0 lb

## 2021-07-19 DIAGNOSIS — Z9189 Other specified personal risk factors, not elsewhere classified: Secondary | ICD-10-CM

## 2021-07-19 DIAGNOSIS — R519 Headache, unspecified: Secondary | ICD-10-CM | POA: Diagnosis not present

## 2021-07-19 DIAGNOSIS — Z6841 Body Mass Index (BMI) 40.0 and over, adult: Secondary | ICD-10-CM

## 2021-07-19 DIAGNOSIS — E669 Obesity, unspecified: Secondary | ICD-10-CM

## 2021-07-19 DIAGNOSIS — E1169 Type 2 diabetes mellitus with other specified complication: Secondary | ICD-10-CM | POA: Diagnosis not present

## 2021-07-19 DIAGNOSIS — Z7985 Long-term (current) use of injectable non-insulin antidiabetic drugs: Secondary | ICD-10-CM

## 2021-07-19 MED ORDER — TIRZEPATIDE 12.5 MG/0.5ML ~~LOC~~ SOAJ: 12.5000 mg | SUBCUTANEOUS | 0 refills | Status: DC

## 2021-08-01 ENCOUNTER — Other Ambulatory Visit (INDEPENDENT_AMBULATORY_CARE_PROVIDER_SITE_OTHER): Payer: Self-pay | Admitting: Physician Assistant

## 2021-08-01 ENCOUNTER — Other Ambulatory Visit (HOSPITAL_COMMUNITY): Payer: Self-pay

## 2021-08-01 DIAGNOSIS — E1169 Type 2 diabetes mellitus with other specified complication: Secondary | ICD-10-CM

## 2021-08-02 NOTE — Progress Notes (Signed)
Chief Complaint:   OBESITY Linda Olson is here to discuss her progress with her obesity treatment plan along with follow-up of her obesity related diagnoses. Linda Olson is on the Category 2 Plan and states she is following her eating plan approximately 70% of the time. Linda Olson states she is walking for 30 minutes 3 times per week.  Today's visit was #: 44 Starting weight: 237 lbs Starting date: 04/28/2020 Today's weight: 209 lbs Today's date: 07/19/2021 Total lbs lost to date: 28 Total lbs lost since last in-office visit: 7  Interim History: Linda Olson has done especially well with weight loss. She has increased her walking and she is doing better with portion control and making smarter choices. She is working on increasing her protein but she struggles sometimes.   Subjective:   1. Nonintractable headache, unspecified chronicity pattern, unspecified headache type Linda Olson has had more headaches recently. She is doing well with weight loss, but she notes increased headaches. This may be due to dehydration.  2. Type 2 diabetes mellitus with other specified complication, without long-term current use of insulin (Linda Olson) Linda Olson continues to work on her diet, and she is doing well on Linda Olson.   3. At risk for dehydration Linda Olson is at risk for dehydration due to inadequate water intake.   Assessment/Plan:   1. Nonintractable headache, unspecified chronicity pattern, unspecified headache type Linda Olson is to increase her water intake to 100+ oz daily.  2. Type 2 diabetes mellitus with other specified complication, without long-term current use of insulin (Linda Olson) We will refill Mounjaro for 1 month. Good blood sugar control is important to decrease the likelihood of diabetic complications such as nephropathy, neuropathy, limb loss, blindness, coronary artery disease, and death. Intensive lifestyle modification including diet, exercise and weight loss are the first line of  treatment for diabetes.   - tirzepatide (MOUNJARO) 12.5 MG/0.5ML Pen; Inject 12.5 mg into the skin once a week.  Dispense: 2 mL; Refill: 0  3. At risk for dehydration Linda Olson was given approximately 15 minutes of dehydration prevention counseling today. Linda Olson is at risk for dehydration due to weight loss and current medication(s). She was encouraged to hydrate and monitor fluid status to avoid dehydration as weight loss plateaus.  4. Obesity, Current BMI 40.9 Linda Olson is currently in the action stage of change. As such, her goal is to continue with weight loss efforts. She has agreed to the Category 2 Plan.   Exercise goals: As is.  Behavioral modification strategies: increasing lean protein intake.  Linda Olson has agreed to follow-up with our clinic in 3 weeks. She was informed of the importance of frequent follow-up visits to maximize her success with intensive lifestyle modifications for her multiple health conditions.   Objective:   Blood pressure 130/72, pulse 64, temperature 98.3 F (36.8 C), height 5' (1.524 m), weight 209 lb (94.8 kg), SpO2 98 %. Body mass index is 40.82 kg/m.  General: Cooperative, alert, well developed, in no acute distress. HEENT: Conjunctivae and lids unremarkable. Cardiovascular: Regular rhythm.  Lungs: Normal work of breathing. Neurologic: No focal deficits.   Lab Results  Component Value Date   CREATININE 0.93 03/30/2021   BUN 24 03/30/2021   NA 140 03/30/2021   K 4.8 03/30/2021   CL 102 03/30/2021   CO2 21 03/30/2021   Lab Results  Component Value Date   ALT 33 (H) 03/30/2021   AST 24 03/30/2021   ALKPHOS 134 (H) 03/30/2021   BILITOT 0.3 03/30/2021   Lab Results  Component Value  Date   HGBA1C 6.2 (H) 03/30/2021   HGBA1C 6.0 (H) 08/09/2020   HGBA1C 8.2 (H) 04/29/2020   HGBA1C 6.2 (H) 03/19/2019   Lab Results  Component Value Date   INSULIN 16.0 08/09/2020   INSULIN 22.1 04/29/2020   Lab Results  Component Value Date    TSH 1.380 03/30/2021   Lab Results  Component Value Date   CHOL 213 (H) 03/30/2021   HDL 49 03/30/2021   LDLCALC 150 (H) 03/30/2021   TRIG 81 03/30/2021   CHOLHDL 4.3 03/30/2021   Lab Results  Component Value Date   VD25OH 40.3 03/30/2021   VD25OH 65.9 08/09/2020   VD25OH 21.9 (L) 04/29/2020   Lab Results  Component Value Date   WBC 5.3 03/30/2021   HGB 12.4 03/30/2021   HCT 37.1 03/30/2021   MCV 87 03/30/2021   PLT 366 03/30/2021   Lab Results  Component Value Date   IRON 53 03/30/2021   TIBC 298 03/30/2021   FERRITIN 157 (H) 03/30/2021   Attestation Statements:   Reviewed by clinician on day of visit: allergies, medications, problem list, medical history, surgical history, family history, social history, and previous encounter notes.   I, Trixie Dredge, am acting as transcriptionist for Dennard Nip, MD.  I have reviewed the above documentation for accuracy and completeness, and I agree with the above. -  Dennard Nip, MD

## 2021-08-03 ENCOUNTER — Other Ambulatory Visit (HOSPITAL_COMMUNITY): Payer: Self-pay

## 2021-08-07 ENCOUNTER — Other Ambulatory Visit (INDEPENDENT_AMBULATORY_CARE_PROVIDER_SITE_OTHER): Payer: Self-pay | Admitting: Family Medicine

## 2021-08-07 DIAGNOSIS — E1169 Type 2 diabetes mellitus with other specified complication: Secondary | ICD-10-CM

## 2021-08-07 NOTE — Telephone Encounter (Signed)
Dr.Beasley 

## 2021-08-08 ENCOUNTER — Ambulatory Visit (INDEPENDENT_AMBULATORY_CARE_PROVIDER_SITE_OTHER): Payer: BC Managed Care – PPO | Admitting: Family Medicine

## 2021-08-15 ENCOUNTER — Other Ambulatory Visit (HOSPITAL_COMMUNITY): Payer: Self-pay

## 2021-09-04 ENCOUNTER — Other Ambulatory Visit: Payer: Self-pay

## 2021-09-04 ENCOUNTER — Encounter (HOSPITAL_COMMUNITY): Payer: Self-pay

## 2021-09-04 ENCOUNTER — Emergency Department (HOSPITAL_COMMUNITY)
Admission: EM | Admit: 2021-09-04 | Discharge: 2021-09-04 | Disposition: A | Discharge status: Home / Self Care | Payer: BC Managed Care – PPO | Attending: Emergency Medicine | Admitting: Emergency Medicine

## 2021-09-04 ENCOUNTER — Emergency Department (HOSPITAL_COMMUNITY): Payer: BC Managed Care – PPO

## 2021-09-04 DIAGNOSIS — I10 Essential (primary) hypertension: Secondary | ICD-10-CM | POA: Insufficient documentation

## 2021-09-04 DIAGNOSIS — S0990XA Unspecified injury of head, initial encounter: Secondary | ICD-10-CM | POA: Diagnosis not present

## 2021-09-04 DIAGNOSIS — Y92410 Unspecified street and highway as the place of occurrence of the external cause: Secondary | ICD-10-CM | POA: Diagnosis not present

## 2021-09-04 DIAGNOSIS — M25511 Pain in right shoulder: Secondary | ICD-10-CM | POA: Diagnosis not present

## 2021-09-04 DIAGNOSIS — M545 Low back pain, unspecified: Secondary | ICD-10-CM | POA: Diagnosis not present

## 2021-09-04 DIAGNOSIS — G4489 Other headache syndrome: Secondary | ICD-10-CM | POA: Diagnosis not present

## 2021-09-04 DIAGNOSIS — M25519 Pain in unspecified shoulder: Secondary | ICD-10-CM | POA: Diagnosis not present

## 2021-09-04 DIAGNOSIS — R519 Headache, unspecified: Secondary | ICD-10-CM | POA: Diagnosis not present

## 2021-09-04 DIAGNOSIS — Z79899 Other long term (current) drug therapy: Secondary | ICD-10-CM | POA: Insufficient documentation

## 2021-09-04 DIAGNOSIS — Z041 Encounter for examination and observation following transport accident: Secondary | ICD-10-CM | POA: Diagnosis not present

## 2021-09-04 DIAGNOSIS — J45909 Unspecified asthma, uncomplicated: Secondary | ICD-10-CM | POA: Diagnosis not present

## 2021-09-04 DIAGNOSIS — R4020 Unspecified coma: Secondary | ICD-10-CM | POA: Diagnosis not present

## 2021-09-04 DIAGNOSIS — R55 Syncope and collapse: Secondary | ICD-10-CM | POA: Diagnosis not present

## 2021-09-04 DIAGNOSIS — M542 Cervicalgia: Secondary | ICD-10-CM | POA: Insufficient documentation

## 2021-09-04 LAB — TROPONIN I (HIGH SENSITIVITY): Troponin I (High Sensitivity): 3 ng/L (ref <18)

## 2021-09-04 LAB — BASIC METABOLIC PANEL WITH GFR
Anion gap: 9 (ref 5–15)
BUN: 23 mg/dL — ABNORMAL HIGH (ref 6–20)
CO2: 24 mmol/L (ref 22–32)
Calcium: 9.5 mg/dL (ref 8.9–10.3)
Chloride: 105 mmol/L (ref 98–111)
Creatinine, Ser: 0.83 mg/dL (ref 0.44–1.00)
GFR, Estimated: >60 mL/min
Glucose, Bld: 119 mg/dL — ABNORMAL HIGH (ref 70–99)
Potassium: 4 mmol/L (ref 3.5–5.1)
Sodium: 138 mmol/L (ref 135–145)

## 2021-09-04 LAB — CBC
HCT: 39.4 % (ref 36.0–46.0)
Hemoglobin: 12.3 g/dL (ref 12.0–15.0)
MCH: 28.7 pg (ref 26.0–34.0)
MCHC: 31.2 g/dL (ref 30.0–36.0)
MCV: 91.8 fL (ref 80.0–100.0)
Platelets: 308 10*3/uL (ref 150–400)
RBC: 4.29 MIL/uL (ref 3.87–5.11)
RDW: 13.2 % (ref 11.5–15.5)
WBC: 7 10*3/uL (ref 4.0–10.5)
nRBC: 0 % (ref 0.0–0.2)

## 2021-09-04 MED ORDER — IBUPROFEN 200 MG PO TABS
600.0000 mg | ORAL_TABLET | Freq: Once | ORAL | Status: AC
  Administered 2021-09-04: 600 mg via ORAL
  Filled 2021-09-04: qty 3

## 2021-09-04 MED ORDER — METHOCARBAMOL 500 MG PO TABS
500.0000 mg | ORAL_TABLET | Freq: Two times a day (BID) | ORAL | 0 refills | Status: DC
Start: 2021-09-04 — End: 2022-04-22

## 2021-09-04 NOTE — Discharge Instructions (Signed)
Take NSAIDs or Tylenol as needed for the next week. Take this medicine with food. Take muscle relaxer at bedtime to help you sleep. This medicine makes you drowsy so do not take before driving or work Use a heating pad for sore muscles - use for 20 minutes several times a day Try gentle range of motion exercises Return for worsening symptoms  
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
## 2021-09-07 ENCOUNTER — Other Ambulatory Visit (HOSPITAL_COMMUNITY): Payer: Self-pay

## 2021-09-07 ENCOUNTER — Encounter (INDEPENDENT_AMBULATORY_CARE_PROVIDER_SITE_OTHER): Payer: Self-pay | Admitting: Family Medicine

## 2021-09-07 ENCOUNTER — Ambulatory Visit (INDEPENDENT_AMBULATORY_CARE_PROVIDER_SITE_OTHER): Payer: BC Managed Care – PPO | Admitting: Family Medicine

## 2021-09-07 VITALS — BP 151/72 | HR 68 | Temp 97.9°F | Ht 60.0 in | Wt 215.0 lb

## 2021-09-07 DIAGNOSIS — Z7985 Long-term (current) use of injectable non-insulin antidiabetic drugs: Secondary | ICD-10-CM

## 2021-09-07 DIAGNOSIS — E1169 Type 2 diabetes mellitus with other specified complication: Secondary | ICD-10-CM

## 2021-09-07 DIAGNOSIS — E669 Obesity, unspecified: Secondary | ICD-10-CM | POA: Diagnosis not present

## 2021-09-07 DIAGNOSIS — Z6841 Body Mass Index (BMI) 40.0 and over, adult: Secondary | ICD-10-CM

## 2021-09-07 DIAGNOSIS — E860 Dehydration: Secondary | ICD-10-CM

## 2021-09-07 MED ORDER — TIRZEPATIDE 12.5 MG/0.5ML ~~LOC~~ SOAJ
12.5000 mg | SUBCUTANEOUS | 0 refills | Status: DC
  Filled 2021-09-07: qty 2, 28d supply, fill #0

## 2021-09-11 NOTE — Progress Notes (Signed)
Chief Complaint:   OBESITY Linda Olson is here to discuss her progress with her obesity treatment plan along with follow-up of her obesity related diagnoses. Linda Olson is on the Category 2 Plan and states she is following her eating plan approximately 0% of the time. [name] states she is doing 0 minutes 0 times per week.  Today's visit was #: 59 Starting weight: 237 lbs Starting date: 04/28/2020 Today's weight: 215 lbs Today's date: 09/07/2021 Total lbs lost to date: 22 Total lbs lost since last in-office visit: 0  Interim History: Linda Olson has done more traveling recently and she has been on vacation.  She has been off her GLP-1 due to pharmacy shortages.  She is ready to get back on track.  Subjective:   1. Type 2 diabetes mellitus with other specified complication, without long-term current use of insulin (HCC) [name] has been out of her medications for 1 month.  She has struggled more with increased simple carbohydrates and increased polyphagia.  She is ready to restart her eating plan and her [name] is back in stock.  2. Dehydration [name] works in the heat and she has been dehydrated.  She notes some increase in muscle cramping as well.  Assessment/Plan:   1. Type 2 diabetes mellitus with other specified complication, without long-term current use of insulin (HCC) Linda Olson will continue Mounjaro 12.5 mg once weekly, and we will refill for 1 month.  We will recheck labs in 1 month.  - tirzepatide (MOUNJARO) 12.5 MG/0.5ML Pen; Inject 1 pen (12.5 mg) into the skin once a week.  Dispense: 2 mL; Refill: 0  2. Dehydration Linda Olson is to continue to increase her water intake, and increase low calorie electrolytes.  We will recheck labs in 1 month.  3. Obesity, Current BMI 42.1 Linda Olson is currently in the action stage of change. As such, her goal is to continue with weight loss efforts. She has agreed to the Category 2 Plan.   We will recheck fasting labs at her  next visit.  Behavioral modification strategies: increasing lean protein intake and increasing water intake.  Linda Olson has agreed to follow-up with our clinic in 3 weeks. She was informed of the importance of frequent follow-up visits to maximize her success with intensive lifestyle modifications for her multiple health conditions.   Objective:   Blood pressure (!) 151/72, pulse 68, temperature 97.9 F (36.6 C), height 5' (1.524 m), weight 215 lb (97.5 kg), SpO2 95 %. Body mass index is 41.99 kg/m.  General: Cooperative, alert, well developed, in no acute distress. HEENT: Conjunctivae and lids unremarkable. Cardiovascular: Regular rhythm.  Lungs: Normal work of breathing. Neurologic: No focal deficits.   Lab Results  Component Value Date   CREATININE 0.83 09/04/2021   BUN 23 (H) 09/04/2021   NA 138 09/04/2021   K 4.0 09/04/2021   CL 105 09/04/2021   CO2 24 09/04/2021   Lab Results  Component Value Date   ALT 33 (H) 03/30/2021   AST 24 03/30/2021   ALKPHOS 134 (H) 03/30/2021   BILITOT 0.3 03/30/2021   Lab Results  Component Value Date   HGBA1C 6.2 (H) 03/30/2021   HGBA1C 6.0 (H) 08/09/2020   HGBA1C 8.2 (H) 04/29/2020   HGBA1C 6.2 (H) 03/19/2019   Lab Results  Component Value Date   INSULIN 16.0 08/09/2020   INSULIN 22.1 04/29/2020   Lab Results  Component Value Date   TSH 1.380 03/30/2021   Lab Results  Component Value Date   CHOL 213 (  H) 03/30/2021   HDL 49 03/30/2021   LDLCALC 150 (H) 03/30/2021   TRIG 81 03/30/2021   CHOLHDL 4.3 03/30/2021   Lab Results  Component Value Date   VD25OH 40.3 03/30/2021   VD25OH 65.9 08/09/2020   VD25OH 21.9 (L) 04/29/2020   Lab Results  Component Value Date   WBC 7.0 09/04/2021   HGB 12.3 09/04/2021   HCT 39.4 09/04/2021   MCV 91.8 09/04/2021   PLT 308 09/04/2021   Lab Results  Component Value Date   IRON 53 03/30/2021   TIBC 298 03/30/2021   FERRITIN 157 (H) 03/30/2021   Attestation Statements:    Reviewed by clinician on day of visit: allergies, medications, problem list, medical history, surgical history, family history, social history, and previous encounter notes.   I, Trixie Dredge, am acting as transcriptionist for Dennard Nip, MD.  I have reviewed the above documentation for accuracy and completeness, and I agree with the above. -  Dennard Nip, MD

## 2021-09-13 ENCOUNTER — Ambulatory Visit: Payer: BC Managed Care – PPO | Admitting: Surgery

## 2021-09-13 ENCOUNTER — Other Ambulatory Visit (HOSPITAL_COMMUNITY): Payer: Self-pay

## 2021-09-18 IMAGING — US US THYROID
1 series · 13 of 25 positions shown · non-contrast
Comparison: None.

CLINICAL DATA: Abnormal magnetic resonance angiography (MRA) of
chest. DX THYROID NODULE SEEN ON MRA CHEST

EXAM:
THYROID ULTRASOUND
TECHNIQUE: Ultrasound examination of the thyroid gland and adjacent soft
tissues was performed.

[Series 1: us thyroid · 0.09mm/px · 13 of 37 slices shown]
[im 1/37]
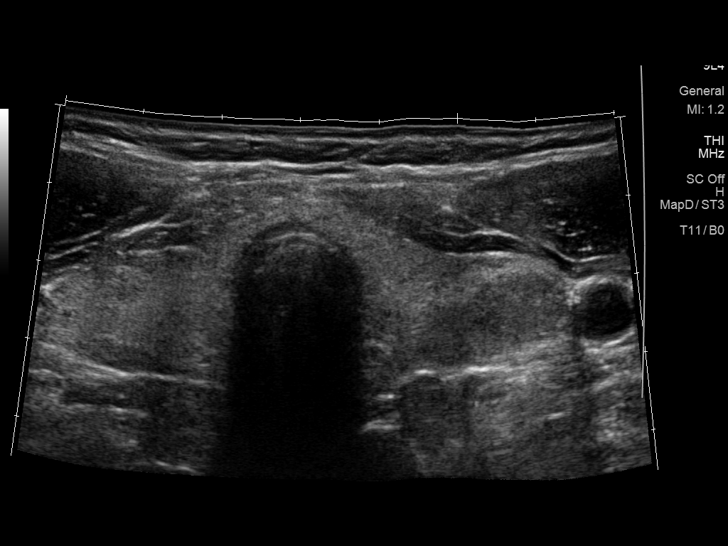
[im 4/37]
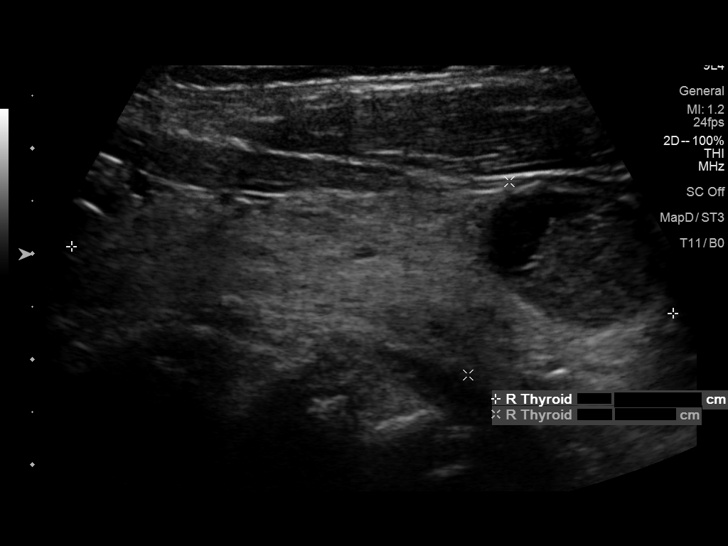
[im 7/37]
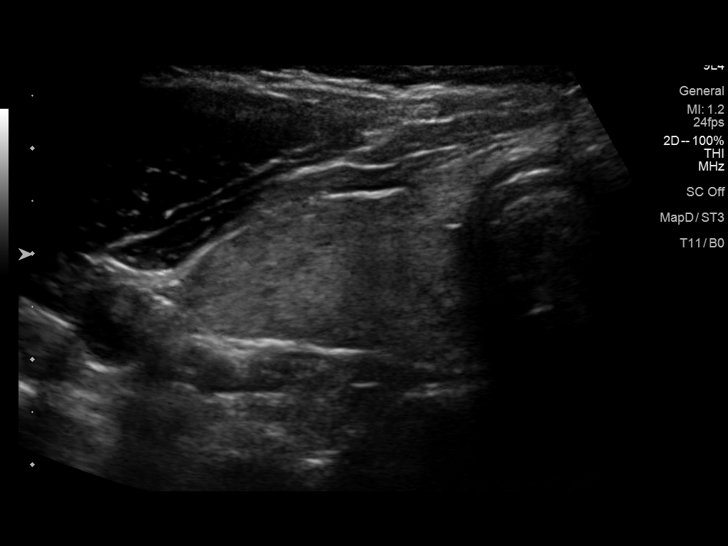
[im 10/37]
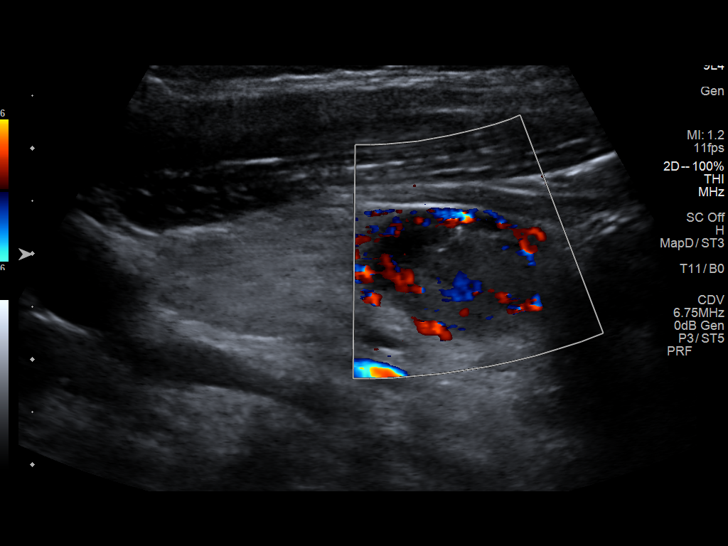
[im 13/37]
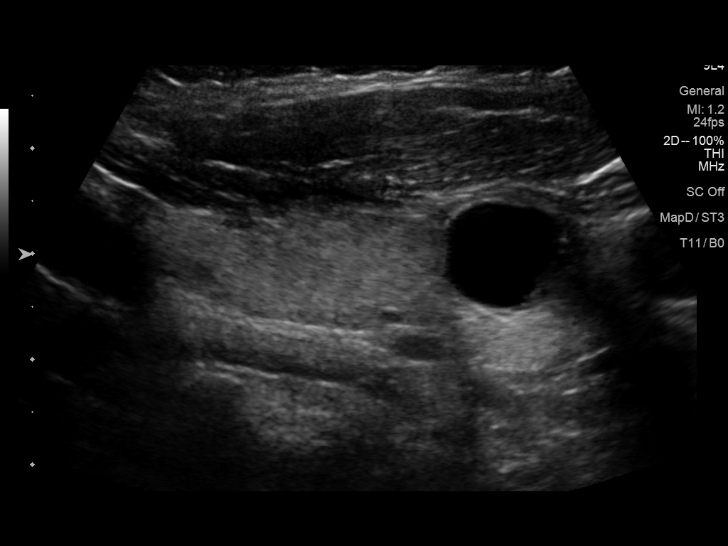
[im 16/37]
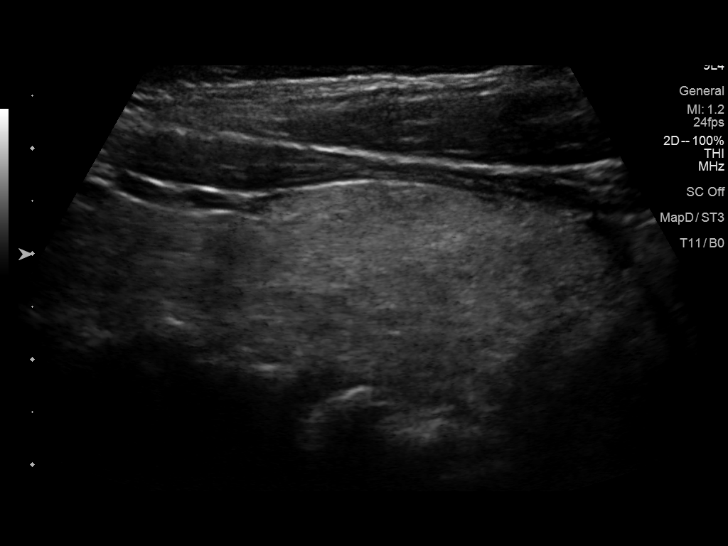
[im 19/37]
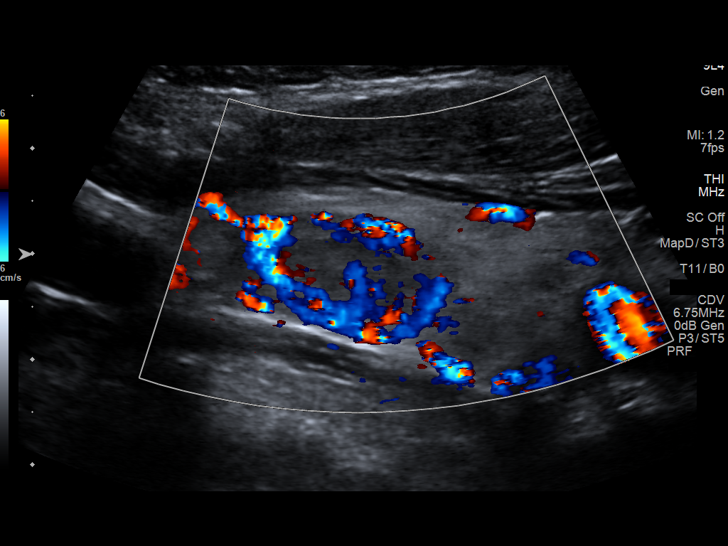
[im 22/37]
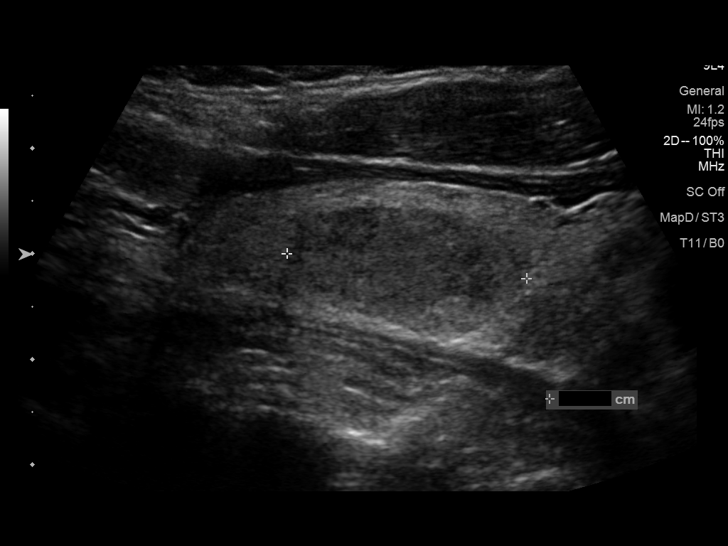
[im 25/37]
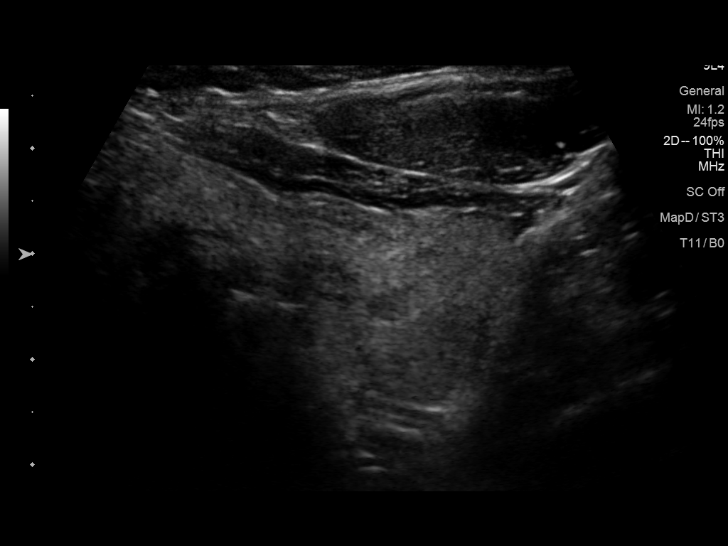
[im 28/37]
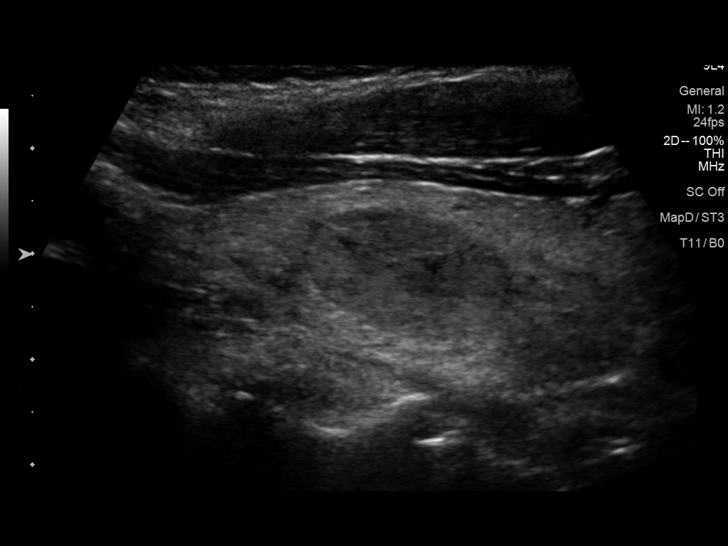
[im 31/37]
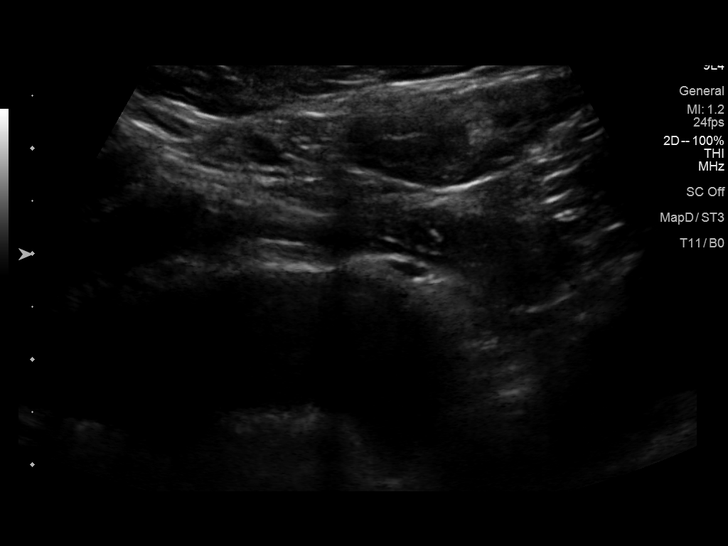
[im 34/37]
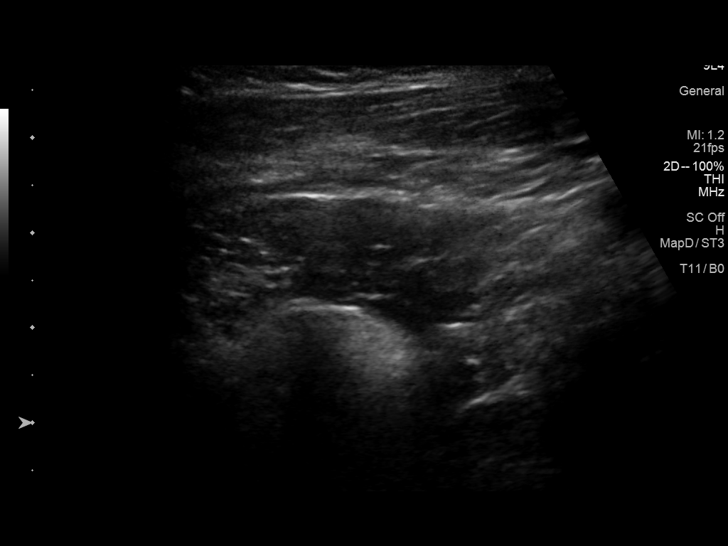
[im 37/37]
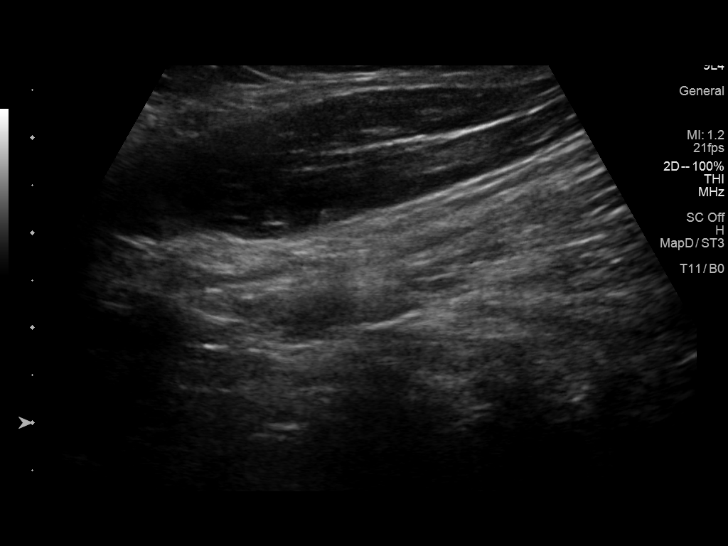

[13 of 25 positions shown; findings below may reference images not displayed]

FINDINGS: Parenchymal Echotexture: Mildly heterogenous

Isthmus: 0.4 cm

Right lobe: 5.7 x 1.9 x 2.7 cm

Left lobe: 5.4 x 1.7 x 2.5 cm

_________________________________________________________

Estimated total number of nodules >/= 1 cm: 2

Number of spongiform nodules >/=  2 cm not described below (TR1): 0

Number of mixed cystic and solid nodules >/= 1.5 cm not described
below (TR2): 0

_________________________________________________________

Nodule # 1:

Location: Right; Inferior

Maximum size: 2.1 cm; Other 2 dimensions: 1.9 x 1.5 cm

Composition: mixed cystic and solid (1)

Echogenicity: hypoechoic (2)

Shape: not taller-than-wide (0)

Margins: smooth (0)

Echogenic foci: macrocalcifications (1)

ACR TI-RADS total points: 4.

ACR TI-RADS risk category: TR4 (4-6 points).

ACR TI-RADS recommendations:

**Given size (>/= 1.5 cm) and appearance, fine needle aspiration of
this moderately suspicious nodule should be considered based on
TI-RADS criteria.

_________________________________________________________

Nodule # 2:

Location: Left; Mid

Maximum size: 2.5 cm; Other 2 dimensions: 2.3 x 2.1 cm

Composition: solid/almost completely solid (2)

Echogenicity: hypoechoic (2)

Shape: not taller-than-wide (0)

Margins: smooth (0)

Echogenic foci: none (0)

ACR TI-RADS total points: 4.

ACR TI-RADS risk category: TR4 (4-6 points).

ACR TI-RADS recommendations:

**Given size (>/= 1.5 cm) and appearance, fine needle aspiration of
this moderately suspicious nodule should be considered based on
TI-RADS criteria.

_________________________________________________________
IMPRESSION: 1. Multinodular thyroid gland.
2. Nodule labeled 1 in the right thyroid lobe meets criteria for
biopsy.
3. Nodule labeled 2 in the left thyroid lobe meets criteria for
biopsy.

The above is in keeping with the ACR TI-RADS recommendations - [HOSPITAL] 7896;[DATE].

## 2021-09-20 ENCOUNTER — Encounter: Payer: Self-pay | Admitting: Surgery

## 2021-09-20 ENCOUNTER — Ambulatory Visit: Payer: Self-pay

## 2021-09-20 ENCOUNTER — Ambulatory Visit (INDEPENDENT_AMBULATORY_CARE_PROVIDER_SITE_OTHER): Payer: BC Managed Care – PPO | Admitting: Surgery

## 2021-09-20 VITALS — BP 149/86 | HR 78 | Ht 60.0 in | Wt 215.0 lb

## 2021-09-20 DIAGNOSIS — M25512 Pain in left shoulder: Secondary | ICD-10-CM

## 2021-09-20 DIAGNOSIS — Z981 Arthrodesis status: Secondary | ICD-10-CM

## 2021-09-20 DIAGNOSIS — M545 Low back pain, unspecified: Secondary | ICD-10-CM | POA: Diagnosis not present

## 2021-09-20 MED ORDER — METHOCARBAMOL 500 MG PO TABS: 500.0000 mg | ORAL_TABLET | Freq: Three times a day (TID) | ORAL | 0 refills | Status: DC | PRN
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
## 2021-09-28 ENCOUNTER — Encounter: Payer: Self-pay | Admitting: Rehabilitative and Restorative Service Providers"

## 2021-09-28 ENCOUNTER — Ambulatory Visit (INDEPENDENT_AMBULATORY_CARE_PROVIDER_SITE_OTHER): Payer: BC Managed Care – PPO | Admitting: Rehabilitative and Restorative Service Providers"

## 2021-09-28 DIAGNOSIS — R293 Abnormal posture: Secondary | ICD-10-CM | POA: Diagnosis not present

## 2021-09-28 DIAGNOSIS — R262 Difficulty in walking, not elsewhere classified: Secondary | ICD-10-CM

## 2021-09-28 DIAGNOSIS — M6281 Muscle weakness (generalized): Secondary | ICD-10-CM

## 2021-09-28 DIAGNOSIS — M5459 Other low back pain: Secondary | ICD-10-CM

## 2021-09-28 DIAGNOSIS — M25512 Pain in left shoulder: Secondary | ICD-10-CM
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
## 2021-09-29 ENCOUNTER — Other Ambulatory Visit (INDEPENDENT_AMBULATORY_CARE_PROVIDER_SITE_OTHER): Payer: Self-pay | Admitting: Family Medicine

## 2021-09-29 DIAGNOSIS — E1169 Type 2 diabetes mellitus with other specified complication: Secondary | ICD-10-CM

## 2021-10-02 ENCOUNTER — Encounter (INDEPENDENT_AMBULATORY_CARE_PROVIDER_SITE_OTHER): Payer: Self-pay | Admitting: Family Medicine

## 2021-10-02 ENCOUNTER — Ambulatory Visit (INDEPENDENT_AMBULATORY_CARE_PROVIDER_SITE_OTHER): Payer: BC Managed Care – PPO | Admitting: Family Medicine

## 2021-10-02 VITALS — BP 118/73 | HR 68 | Temp 98.0°F | Ht 60.0 in | Wt 208.0 lb

## 2021-10-02 DIAGNOSIS — E669 Obesity, unspecified: Secondary | ICD-10-CM | POA: Diagnosis not present

## 2021-10-02 DIAGNOSIS — Z7985 Long-term (current) use of injectable non-insulin antidiabetic drugs: Secondary | ICD-10-CM

## 2021-10-02 DIAGNOSIS — Z6841 Body Mass Index (BMI) 40.0 and over, adult: Secondary | ICD-10-CM | POA: Diagnosis not present

## 2021-10-02 DIAGNOSIS — E1169 Type 2 diabetes mellitus with other specified complication: Secondary | ICD-10-CM

## 2021-10-02 DIAGNOSIS — I10 Essential (primary) hypertension: Secondary | ICD-10-CM

## 2021-10-03 MED ORDER — TIRZEPATIDE 12.5 MG/0.5ML ~~LOC~~ SOAJ: 12.5000 mg | SUBCUTANEOUS | 1 refills | Status: DC

## 2021-10-08 ENCOUNTER — Other Ambulatory Visit: Payer: Self-pay | Admitting: Orthopaedic Surgery

## 2021-10-10 NOTE — Progress Notes (Unsigned)
Chief Complaint:   OBESITY Linda Olson is here to discuss her progress with her obesity treatment plan along with follow-up of her obesity related diagnoses. Linda Olson is on the Category 2 Plan and states she is following her eating plan approximately 70% of the time. Linda Olson states she is walking and lifting weights for 25 minutes 2-3 times per week.  Today's visit was #: 77 Starting weight: 237 lbs Starting date: 04/28/2020 Today's weight: 208 lbs Today's date: 10/02/2021 Total lbs lost to date: 29 Total lbs lost since last in-office visit: 7  Interim History: Linda Olson has done well with getting back on track with her Category 2 plan. She is trying to avoid sweets and chips. Her hunger is mostly controlled.   Subjective:   1. Type 2 diabetes mellitus with other specified complication, without long-term current use of insulin (HCC) Linda Olson is doing especially well with her eating plan. She is doing well with her medications with no side effects noted.   2. Essential hypertension Linda Olson's blood pressure is well controlled with her diet and weight loss. She has no signs of hypotension.  Assessment/Plan:   1. Type 2 diabetes mellitus with other specified complication, without long-term current use of insulin (HCC) Linda Olson will continue Mounjaro 12.5 mg once weekly, and we will refill for 2 months.   - tirzepatide (MOUNJARO) 12.5 MG/0.5ML Pen; Inject 1 pen (12.5 mg) into the skin once a week.  Dispense: 2 mL; Refill: 1  2. Essential hypertension Linda Olson will continue her with her diet and weight loss as is, and we will continue to follow. She may be able to discontinue her medications with continued weight loss.   3. Obesity, Current BMI 40.7 Linda Olson is currently in the action stage of change. As such, her goal is to continue with weight loss efforts. She has agreed to the Category 2 Plan.   Exercise goals: As is.   Behavioral modification strategies: increasing  lean protein intake.  Linda Olson has agreed to follow-up with our clinic in 3 weeks. She was informed of the importance of frequent follow-up visits to maximize her success with intensive lifestyle modifications for her multiple health conditions.   Objective:   Blood pressure 118/73, pulse 68, temperature 98 F (36.7 C), height 5' (1.524 m), weight 208 lb (94.3 kg), SpO2 100 %. Body mass index is 40.62 kg/m.  General: Cooperative, alert, well developed, in no acute distress. HEENT: Conjunctivae and lids unremarkable. Cardiovascular: Regular rhythm.  Lungs: Normal work of breathing. Neurologic: No focal deficits.   Lab Results  Component Value Date   CREATININE 0.83 09/04/2021   BUN 23 (H) 09/04/2021   NA 138 09/04/2021   K 4.0 09/04/2021   CL 105 09/04/2021   CO2 24 09/04/2021   Lab Results  Component Value Date   ALT 33 (H) 03/30/2021   AST 24 03/30/2021   ALKPHOS 134 (H) 03/30/2021   BILITOT 0.3 03/30/2021   Lab Results  Component Value Date   HGBA1C 6.2 (H) 03/30/2021   HGBA1C 6.0 (H) 08/09/2020   HGBA1C 8.2 (H) 04/29/2020   HGBA1C 6.2 (H) 03/19/2019   Lab Results  Component Value Date   INSULIN 16.0 08/09/2020   INSULIN 22.1 04/29/2020   Lab Results  Component Value Date   TSH 1.380 03/30/2021   Lab Results  Component Value Date   CHOL 213 (H) 03/30/2021   HDL 49 03/30/2021   LDLCALC 150 (H) 03/30/2021   TRIG 81 03/30/2021   CHOLHDL 4.3 03/30/2021  Lab Results  Component Value Date   VD25OH 40.3 03/30/2021   VD25OH 65.9 08/09/2020   VD25OH 21.9 (L) 04/29/2020   Lab Results  Component Value Date   WBC 7.0 09/04/2021   HGB 12.3 09/04/2021   HCT 39.4 09/04/2021   MCV 91.8 09/04/2021   PLT 308 09/04/2021   Lab Results  Component Value Date   IRON 53 03/30/2021   TIBC 298 03/30/2021   FERRITIN 157 (H) 03/30/2021   Attestation Statements:   Reviewed by clinician on day of visit: allergies, medications, problem list, medical history,  surgical history, family history, social history, and previous encounter notes.   I, Trixie Dredge, am acting as transcriptionist for Dennard Nip, MD.  I have reviewed the above documentation for accuracy and completeness, and I agree with the above. -  Dennard Nip, MD

## 2021-10-11 ENCOUNTER — Ambulatory Visit: Payer: BC Managed Care – PPO | Admitting: Orthopaedic Surgery

## 2021-10-11 ENCOUNTER — Encounter: Payer: Self-pay | Admitting: Physical Therapy

## 2021-10-11 ENCOUNTER — Encounter (INDEPENDENT_AMBULATORY_CARE_PROVIDER_SITE_OTHER): Payer: Self-pay

## 2021-10-11 ENCOUNTER — Ambulatory Visit (INDEPENDENT_AMBULATORY_CARE_PROVIDER_SITE_OTHER): Payer: BC Managed Care – PPO | Admitting: Physical Therapy

## 2021-10-11 DIAGNOSIS — R293 Abnormal posture: Secondary | ICD-10-CM | POA: Diagnosis not present

## 2021-10-11 DIAGNOSIS — M6281 Muscle weakness (generalized): Secondary | ICD-10-CM

## 2021-10-11 DIAGNOSIS — R262 Difficulty in walking, not elsewhere classified: Secondary | ICD-10-CM | POA: Diagnosis not present

## 2021-10-11 DIAGNOSIS — M5459 Other low back pain: Secondary | ICD-10-CM | POA: Diagnosis not present
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
## 2021-10-13 ENCOUNTER — Encounter: Payer: BC Managed Care – PPO | Admitting: Rehabilitative and Restorative Service Providers"

## 2021-10-13 DIAGNOSIS — L309 Dermatitis, unspecified: Secondary | ICD-10-CM | POA: Diagnosis not present

## 2021-10-13 DIAGNOSIS — Z6841 Body Mass Index (BMI) 40.0 and over, adult: Secondary | ICD-10-CM | POA: Diagnosis not present

## 2021-10-18 ENCOUNTER — Ambulatory Visit (INDEPENDENT_AMBULATORY_CARE_PROVIDER_SITE_OTHER): Payer: BC Managed Care – PPO | Admitting: Rehabilitative and Restorative Service Providers"

## 2021-10-18 ENCOUNTER — Encounter: Payer: Self-pay | Admitting: Rehabilitative and Restorative Service Providers"

## 2021-10-18 DIAGNOSIS — M6281 Muscle weakness (generalized): Secondary | ICD-10-CM | POA: Diagnosis not present

## 2021-10-18 DIAGNOSIS — M5459 Other low back pain: Secondary | ICD-10-CM

## 2021-10-18 DIAGNOSIS — R262 Difficulty in walking, not elsewhere classified: Secondary | ICD-10-CM

## 2021-10-18 DIAGNOSIS — R293 Abnormal posture: Secondary | ICD-10-CM | POA: Diagnosis not present

## 2021-10-18 DIAGNOSIS — M25512 Pain in left shoulder: Secondary | ICD-10-CM
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
## 2021-10-20 ENCOUNTER — Encounter: Payer: Self-pay | Admitting: Physical Therapy

## 2021-10-20 ENCOUNTER — Ambulatory Visit (INDEPENDENT_AMBULATORY_CARE_PROVIDER_SITE_OTHER): Payer: BC Managed Care – PPO | Admitting: Physical Therapy

## 2021-10-20 DIAGNOSIS — R293 Abnormal posture: Secondary | ICD-10-CM

## 2021-10-20 DIAGNOSIS — M5459 Other low back pain: Secondary | ICD-10-CM | POA: Diagnosis not present

## 2021-10-20 DIAGNOSIS — M6281 Muscle weakness (generalized): Secondary | ICD-10-CM | POA: Diagnosis not present

## 2021-10-20 DIAGNOSIS — R262 Difficulty in walking, not elsewhere classified: Secondary | ICD-10-CM | POA: Diagnosis not present
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
## 2021-10-23 ENCOUNTER — Ambulatory Visit (INDEPENDENT_AMBULATORY_CARE_PROVIDER_SITE_OTHER): Payer: BC Managed Care – PPO | Admitting: Family Medicine

## 2021-10-23 ENCOUNTER — Encounter (INDEPENDENT_AMBULATORY_CARE_PROVIDER_SITE_OTHER): Payer: Self-pay | Admitting: Family Medicine

## 2021-10-23 VITALS — BP 113/71 | HR 57 | Temp 97.9°F | Ht 60.0 in | Wt 205.0 lb

## 2021-10-23 DIAGNOSIS — I10 Essential (primary) hypertension: Secondary | ICD-10-CM

## 2021-10-23 DIAGNOSIS — Z6841 Body Mass Index (BMI) 40.0 and over, adult: Secondary | ICD-10-CM

## 2021-10-23 DIAGNOSIS — E669 Obesity, unspecified: Secondary | ICD-10-CM

## 2021-10-23 DIAGNOSIS — E785 Hyperlipidemia, unspecified: Secondary | ICD-10-CM

## 2021-10-23 DIAGNOSIS — E1169 Type 2 diabetes mellitus with other specified complication: Secondary | ICD-10-CM | POA: Diagnosis not present

## 2021-10-23 DIAGNOSIS — Z7985 Long-term (current) use of injectable non-insulin antidiabetic drugs: Secondary | ICD-10-CM

## 2021-10-23 MED ORDER — TIRZEPATIDE 12.5 MG/0.5ML ~~LOC~~ SOAJ: 12.5000 mg | SUBCUTANEOUS | 0 refills | Status: DC

## 2021-10-23 MED ORDER — ATORVASTATIN CALCIUM 40 MG PO TABS: 40.0000 mg | ORAL_TABLET | Freq: Every day | ORAL | 0 refills | Status: DC

## 2021-10-25 ENCOUNTER — Encounter: Payer: Self-pay | Admitting: Physical Therapy

## 2021-10-25 ENCOUNTER — Ambulatory Visit (INDEPENDENT_AMBULATORY_CARE_PROVIDER_SITE_OTHER): Payer: BC Managed Care – PPO | Admitting: Physical Therapy

## 2021-10-25 DIAGNOSIS — R262 Difficulty in walking, not elsewhere classified: Secondary | ICD-10-CM | POA: Diagnosis not present

## 2021-10-25 DIAGNOSIS — R293 Abnormal posture: Secondary | ICD-10-CM | POA: Diagnosis not present

## 2021-10-25 DIAGNOSIS — M5459 Other low back pain: Secondary | ICD-10-CM

## 2021-10-25 DIAGNOSIS — M6281 Muscle weakness (generalized): Secondary | ICD-10-CM
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
## 2021-10-31 NOTE — Progress Notes (Signed)
Chief Complaint:   OBESITY Linda Olson is here to discuss her progress with her obesity treatment plan along with follow-up of her obesity related diagnoses. Linda Olson is on the Category 2 Plan and states she is following her eating plan approximately 0% of the time. Linda Olson states she is doing 0 minutes 0 times per week.  Today's visit was #: 20 Starting weight: 237 lbs Starting date: 04/28/2020 Today's weight: 205 lbs Today's date: 10/23/2021 Total lbs lost to date: 32 Total lbs lost since last in-office visit: 3  Interim History: Linda Olson continues to do well with weight loss.  She did some celebration eating for her birthday.  Her hunger is controlled.  She did some emotional eating, but tried to portion control.  Subjective:   1. Essential hypertension Linda Olson's blood pressure is well controlled on her medications, but she has had a rash recently and her Dermatologist thinks it is related to Hyzaar and sun exposure.  2. Hyperlipidemia, unspecified hyperlipidemia type Linda Olson is stable on Lipitor.  She is working on her diet, but she has been eating some high cholesterol foods recently.  3. Type 2 diabetes mellitus with other specified complication, unspecified whether long term insulin use (Linda Olson) Linda Olson is doing well with her diet.  She denies nausea, vomiting, or hypoglycemia.  Assessment/Plan:   1. Essential hypertension [name] is to continue with her diet and exercise, she is okay with me to stop her medication and see if her blood pressure is well enough controlled with weight loss.  2. Hyperlipidemia, unspecified hyperlipidemia type [name] will continue Lipitor 40 mg once daily, and we will refill for 1 month.  - atorvastatin (LIPITOR) 40 MG tablet; Take 1 tablet (40 mg total) by mouth daily.  Dispense: 30 tablet; Refill: 0  3. Type 2 diabetes mellitus with other specified complication, unspecified whether long term insulin use (Linda Olson) [name] will  continue Mounjaro 12.5 mg once weekly, and we will refill for 1 month.  - tirzepatide (MOUNJARO) 12.5 MG/0.5ML Pen; Inject 1 pen (12.5 mg) into the skin once a week.  Dispense: 2 mL; Refill: 0  4. Obesity, Current BMI 40.2 Linda Olson is currently in the action stage of change. As such, her goal is to continue with weight loss efforts. She has agreed to the Category 2 Plan.   Behavioral modification strategies: increasing lean protein intake, increasing water intake, and travel eating strategies.  Linda Olson has agreed to follow-up with our clinic in 3 weeks. She was informed of the importance of frequent follow-up visits to maximize her success with intensive lifestyle modifications for her multiple health conditions.   Objective:   Blood pressure 113/71, pulse (!) 57, temperature 97.9 F (36.6 C), height 5' (1.524 m), weight 205 lb (93 kg), SpO2 98 %. Body mass index is 40.04 kg/m.  General: Cooperative, alert, well developed, in no acute distress. HEENT: Conjunctivae and lids unremarkable. Cardiovascular: Regular rhythm.  Lungs: Normal work of breathing. Neurologic: No focal deficits.   Lab Results  Component Value Date   CREATININE 0.83 09/04/2021   BUN 23 (H) 09/04/2021   NA 138 09/04/2021   K 4.0 09/04/2021   CL 105 09/04/2021   CO2 24 09/04/2021   Lab Results  Component Value Date   ALT 33 (H) 03/30/2021   AST 24 03/30/2021   ALKPHOS 134 (H) 03/30/2021   BILITOT 0.3 03/30/2021   Lab Results  Component Value Date   HGBA1C 6.2 (H) 03/30/2021   HGBA1C 6.0 (H) 08/09/2020   HGBA1C  8.2 (H) 04/29/2020   HGBA1C 6.2 (H) 03/19/2019   Lab Results  Component Value Date   INSULIN 16.0 08/09/2020   INSULIN 22.1 04/29/2020   Lab Results  Component Value Date   TSH 1.380 03/30/2021   Lab Results  Component Value Date   CHOL 213 (H) 03/30/2021   HDL 49 03/30/2021   LDLCALC 150 (H) 03/30/2021   TRIG 81 03/30/2021   CHOLHDL 4.3 03/30/2021   Lab Results  Component  Value Date   VD25OH 40.3 03/30/2021   VD25OH 65.9 08/09/2020   VD25OH 21.9 (L) 04/29/2020   Lab Results  Component Value Date   WBC 7.0 09/04/2021   HGB 12.3 09/04/2021   HCT 39.4 09/04/2021   MCV 91.8 09/04/2021   PLT 308 09/04/2021   Lab Results  Component Value Date   IRON 53 03/30/2021   TIBC 298 03/30/2021   FERRITIN 157 (H) 03/30/2021   Attestation Statements:   Reviewed by clinician on day of visit: allergies, medications, problem list, medical history, surgical history, family history, social history, and previous encounter notes.   I, Trixie Dredge, am acting as transcriptionist for Dennard Nip, MD.  I have reviewed the above documentation for accuracy and completeness, and I agree with the above. -  Dennard Nip, MD

## 2021-11-01 ENCOUNTER — Encounter: Payer: Self-pay | Admitting: Rehabilitative and Restorative Service Providers"

## 2021-11-01 ENCOUNTER — Ambulatory Visit (INDEPENDENT_AMBULATORY_CARE_PROVIDER_SITE_OTHER): Payer: BC Managed Care – PPO | Admitting: Rehabilitative and Restorative Service Providers"

## 2021-11-01 DIAGNOSIS — R262 Difficulty in walking, not elsewhere classified: Secondary | ICD-10-CM | POA: Diagnosis not present

## 2021-11-01 DIAGNOSIS — R293 Abnormal posture: Secondary | ICD-10-CM

## 2021-11-01 DIAGNOSIS — M5459 Other low back pain: Secondary | ICD-10-CM | POA: Diagnosis not present

## 2021-11-01 DIAGNOSIS — M6281 Muscle weakness (generalized): Secondary | ICD-10-CM | POA: Diagnosis not present
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
## 2021-11-03 ENCOUNTER — Encounter: Payer: Self-pay | Admitting: Rehabilitative and Restorative Service Providers"

## 2021-11-03 ENCOUNTER — Ambulatory Visit (INDEPENDENT_AMBULATORY_CARE_PROVIDER_SITE_OTHER): Payer: BC Managed Care – PPO | Admitting: Rehabilitative and Restorative Service Providers"

## 2021-11-03 DIAGNOSIS — R262 Difficulty in walking, not elsewhere classified: Secondary | ICD-10-CM

## 2021-11-03 DIAGNOSIS — M5459 Other low back pain: Secondary | ICD-10-CM | POA: Diagnosis not present

## 2021-11-03 DIAGNOSIS — R293 Abnormal posture: Secondary | ICD-10-CM

## 2021-11-03 DIAGNOSIS — M6281 Muscle weakness (generalized): Secondary | ICD-10-CM | POA: Diagnosis not present
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
## 2021-11-05 ENCOUNTER — Other Ambulatory Visit (INDEPENDENT_AMBULATORY_CARE_PROVIDER_SITE_OTHER): Payer: Self-pay | Admitting: Family Medicine

## 2021-11-05 DIAGNOSIS — E785 Hyperlipidemia, unspecified: Secondary | ICD-10-CM

## 2021-11-07 ENCOUNTER — Ambulatory Visit (INDEPENDENT_AMBULATORY_CARE_PROVIDER_SITE_OTHER): Payer: BC Managed Care – PPO | Admitting: Orthopaedic Surgery

## 2021-11-07 ENCOUNTER — Encounter: Payer: Self-pay | Admitting: Orthopaedic Surgery

## 2021-11-07 VITALS — BP 104/69 | HR 81 | Ht 60.0 in | Wt 205.0 lb

## 2021-11-07 DIAGNOSIS — S40011D Contusion of right shoulder, subsequent encounter: Secondary | ICD-10-CM

## 2021-11-07 DIAGNOSIS — Z981 Arthrodesis status: Secondary | ICD-10-CM

## 2021-11-07 DIAGNOSIS — M5136 Other intervertebral disc degeneration, lumbar region: Secondary | ICD-10-CM | POA: Diagnosis not present
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
## 2021-11-08 ENCOUNTER — Encounter: Payer: Self-pay | Admitting: Rehabilitative and Restorative Service Providers"

## 2021-11-08 ENCOUNTER — Ambulatory Visit (INDEPENDENT_AMBULATORY_CARE_PROVIDER_SITE_OTHER): Payer: BC Managed Care – PPO | Admitting: Rehabilitative and Restorative Service Providers"

## 2021-11-08 DIAGNOSIS — R293 Abnormal posture: Secondary | ICD-10-CM | POA: Diagnosis not present

## 2021-11-08 DIAGNOSIS — M6281 Muscle weakness (generalized): Secondary | ICD-10-CM

## 2021-11-08 DIAGNOSIS — M5459 Other low back pain: Secondary | ICD-10-CM | POA: Diagnosis not present

## 2021-11-08 DIAGNOSIS — R262 Difficulty in walking, not elsewhere classified: Secondary | ICD-10-CM | POA: Diagnosis not present
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
## 2021-11-10 ENCOUNTER — Ambulatory Visit (INDEPENDENT_AMBULATORY_CARE_PROVIDER_SITE_OTHER): Payer: BC Managed Care – PPO | Admitting: Rehabilitative and Restorative Service Providers"

## 2021-11-10 ENCOUNTER — Encounter: Payer: BC Managed Care – PPO | Admitting: Rehabilitative and Restorative Service Providers"

## 2021-11-10 ENCOUNTER — Encounter: Payer: Self-pay | Admitting: Rehabilitative and Restorative Service Providers"

## 2021-11-10 DIAGNOSIS — R293 Abnormal posture: Secondary | ICD-10-CM | POA: Diagnosis not present

## 2021-11-10 DIAGNOSIS — M5459 Other low back pain: Secondary | ICD-10-CM

## 2021-11-10 DIAGNOSIS — M6281 Muscle weakness (generalized): Secondary | ICD-10-CM | POA: Diagnosis not present

## 2021-11-10 DIAGNOSIS — R262 Difficulty in walking, not elsewhere classified: Secondary | ICD-10-CM | POA: Diagnosis not present

## 2021-11-10 DIAGNOSIS — M25512 Pain in left shoulder: Secondary | ICD-10-CM
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
## 2021-11-16 ENCOUNTER — Ambulatory Visit (INDEPENDENT_AMBULATORY_CARE_PROVIDER_SITE_OTHER): Payer: BC Managed Care – PPO | Admitting: Physical Therapy

## 2021-11-16 ENCOUNTER — Encounter: Payer: Self-pay | Admitting: Physical Therapy

## 2021-11-16 DIAGNOSIS — R293 Abnormal posture: Secondary | ICD-10-CM

## 2021-11-16 DIAGNOSIS — M6281 Muscle weakness (generalized): Secondary | ICD-10-CM | POA: Diagnosis not present

## 2021-11-16 DIAGNOSIS — M5459 Other low back pain: Secondary | ICD-10-CM

## 2021-11-16 DIAGNOSIS — R262 Difficulty in walking, not elsewhere classified: Secondary | ICD-10-CM
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
## 2021-11-20 ENCOUNTER — Ambulatory Visit (INDEPENDENT_AMBULATORY_CARE_PROVIDER_SITE_OTHER): Payer: BC Managed Care – PPO | Admitting: Family Medicine

## 2021-11-21 ENCOUNTER — Ambulatory Visit (INDEPENDENT_AMBULATORY_CARE_PROVIDER_SITE_OTHER): Payer: BC Managed Care – PPO | Admitting: Physical Therapy

## 2021-11-21 ENCOUNTER — Encounter: Payer: Self-pay | Admitting: Physical Therapy

## 2021-11-21 DIAGNOSIS — R262 Difficulty in walking, not elsewhere classified: Secondary | ICD-10-CM | POA: Diagnosis not present

## 2021-11-21 DIAGNOSIS — M6281 Muscle weakness (generalized): Secondary | ICD-10-CM

## 2021-11-21 DIAGNOSIS — M5459 Other low back pain: Secondary | ICD-10-CM | POA: Diagnosis not present

## 2021-11-21 DIAGNOSIS — R293 Abnormal posture: Secondary | ICD-10-CM | POA: Diagnosis not present
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
## 2021-11-22 ENCOUNTER — Encounter (INDEPENDENT_AMBULATORY_CARE_PROVIDER_SITE_OTHER): Payer: Self-pay | Admitting: Family Medicine

## 2021-11-24 ENCOUNTER — Encounter: Payer: Self-pay | Admitting: Surgery

## 2021-11-24 ENCOUNTER — Encounter: Payer: BC Managed Care – PPO | Admitting: Physical Therapy

## 2021-11-28 ENCOUNTER — Encounter: Payer: BC Managed Care – PPO | Admitting: Physical Therapy

## 2021-12-01 ENCOUNTER — Encounter: Payer: BC Managed Care – PPO | Admitting: Physical Therapy

## 2021-12-20 ENCOUNTER — Ambulatory Visit (INDEPENDENT_AMBULATORY_CARE_PROVIDER_SITE_OTHER): Payer: BC Managed Care – PPO | Admitting: Family Medicine

## 2021-12-20 ENCOUNTER — Encounter (INDEPENDENT_AMBULATORY_CARE_PROVIDER_SITE_OTHER): Payer: Self-pay | Admitting: Family Medicine

## 2021-12-20 VITALS — BP 135/84 | HR 71 | Temp 97.9°F | Ht 60.0 in | Wt 203.0 lb

## 2021-12-20 DIAGNOSIS — R519 Headache, unspecified: Secondary | ICD-10-CM

## 2021-12-20 DIAGNOSIS — E1169 Type 2 diabetes mellitus with other specified complication: Secondary | ICD-10-CM

## 2021-12-20 DIAGNOSIS — E7800 Pure hypercholesterolemia, unspecified: Secondary | ICD-10-CM | POA: Diagnosis not present

## 2021-12-20 DIAGNOSIS — Z6839 Body mass index (BMI) 39.0-39.9, adult: Secondary | ICD-10-CM

## 2021-12-20 DIAGNOSIS — E669 Obesity, unspecified: Secondary | ICD-10-CM | POA: Diagnosis not present

## 2021-12-20 DIAGNOSIS — Z7985 Long-term (current) use of injectable non-insulin antidiabetic drugs: Secondary | ICD-10-CM

## 2021-12-20 MED ORDER — ATORVASTATIN CALCIUM 40 MG PO TABS
40.0000 mg | ORAL_TABLET | Freq: Every day | ORAL | 0 refills | Status: DC
Start: 2021-12-20 — End: 2022-01-16

## 2021-12-20 MED ORDER — TIRZEPATIDE 12.5 MG/0.5ML ~~LOC~~ SOAJ: 12.5000 mg | SUBCUTANEOUS | 0 refills | Status: DC

## 2021-12-20 NOTE — Progress Notes (Signed)
Office: [phone]  /  Fax: [phone]   Total lbs lost to date since first visit at [facility]: 34 Total lbs lost since last in-office visit: -2     BP 135/84   Pulse 71   Temp 97.9 F (36.6 C)   Ht 5' (1.524 m)   Wt 203 lb (92.1 kg)   SpO2 99%   BMI 39.65 kg/m  She was weighed on the bioimpedance scale:  Body mass index is 39.65 kg/m.  General:  Alert, oriented and cooperative. Patient is in no acute distress.  Mental Status: Normal mood and affect. Normal behavior. Normal judgment and thought content.        Patient past medical history includes:   Past Medical History:  Diagnosis Date   Asthma    Back pain    Bell's palsy    BMI 40.0-44.9, adult (HCC)    Chest pain    Chewing difficulty    Constipation    GERD (gastroesophageal reflux disease)    Hyperlipidemia    Hypertension    Low vitamin D level    per notes from [place]   Obesity    per notes from [facility]   Orbital floor fracture [facility])    [facility]; per notes from [place]   Prediabetes    per notes from [place]   Preeclampsia    per notes from [place]    History of Present Illness The patient presents with a history of obesity, diabetes, and hyperlipidemia. They have recently lost 2 pounds but are unsure how they achieved this weight loss. The patient reports that they have not been experiencing excessive hunger or cravings, and they believe they may be eating too little. They mention that they prepare the appropriate amount of food but often do not finish it all. The patient's hunger is well-controlled, but they may be struggling to consume enough of the right foods, particularly protein.  The patient has been experiencing morning headaches, which they are unsure of the cause. They have been trying to stay hydrated by drinking water, but they admit that they may not be consuming enough, especially during a recent trip to [facility]. The patient is aware of the  importance of hydration and tries to drink more water when they notice their urine is darker in color.  In addition to the headaches, the patient has been waking up with nausea. They are currently taking Monjaro, losartan, Advil, and methocarbamol (Robaxin) for their various conditions. The patient's last A1c was well-controlled at 6.2, and their last bad cholesterol level was 150 in January. They have not had any recent lab tests but are due for a check-up soon.  Assessment & Plan Obesity: Patient has lost 2 pounds but reports difficulty consuming adequate amounts of food due to lack of hunger. -Encouraged patient to meet a daily calorie goal of 1100-1400 calories and a protein goal of 80 grams or more per day to maintain metabolism and promote weight loss.  Type 2 Diabetes Mellitus: Last A1C was well controlled at 6.2. -Refill Monjaro prescription.  Hyperlipidemia: Last LDL cholesterol was 150 in January. -Refill Lipitor prescription and plan to recheck lipid panel.  Headaches: Patient reports frequent morning headaches. -Encouraged patient to maintain hydration and consider caffeine intake. -Plan to contact neurology for further evaluation.  Follow-up in 4 weeks.  I have personally spent 40 minutes total time today in preparation, patient care, and documentation for this visit, including the following: review of clinical  lab tests; review of medical tests/procedures/services.  Dennard Nip, MD

## 2021-12-21 LAB — HEMOGLOBIN A1C
Est. average glucose Bld gHb Est-mCnc: 126 mg/dL
Hgb A1c MFr Bld: 6 % — ABNORMAL HIGH (ref 4.8–5.6)

## 2021-12-21 LAB — CMP14+EGFR
ALT: 40 IU/L — ABNORMAL HIGH (ref 0–32)
AST: 33 IU/L (ref 0–40)
Albumin/Globulin Ratio: 1.3 (ref 1.2–2.2)
Albumin: 4 g/dL (ref 3.8–4.9)
Alkaline Phosphatase: 124 IU/L — ABNORMAL HIGH (ref 44–121)
BUN/Creatinine Ratio: 24 — ABNORMAL HIGH (ref 9–23)
BUN: 21 mg/dL (ref 6–24)
Bilirubin Total: 0.4 mg/dL (ref 0.0–1.2)
CO2: 24 mmol/L (ref 20–29)
Calcium: 9.5 mg/dL (ref 8.7–10.2)
Chloride: 103 mmol/L (ref 96–106)
Creatinine, Ser: 0.87 mg/dL (ref 0.57–1.00)
Globulin, Total: 3.1 g/dL (ref 1.5–4.5)
Glucose: 85 mg/dL (ref 70–99)
Potassium: 4.6 mmol/L (ref 3.5–5.2)
Sodium: 140 mmol/L (ref 134–144)
Total Protein: 7.1 g/dL (ref 6.0–8.5)
eGFR: 77 mL/min/{1.73_m2} (ref >59)

## 2021-12-21 LAB — VITAMIN B12: Vitamin B-12: 1156 pg/mL (ref 232–1245)

## 2021-12-21 LAB — LIPID PANEL WITH LDL/HDL RATIO
Cholesterol, Total: 235 mg/dL — ABNORMAL HIGH (ref 100–199)
HDL: 57 mg/dL (ref >39)
LDL Chol Calc (NIH): 165 mg/dL — ABNORMAL HIGH (ref 0–99)
LDL/HDL Ratio: 2.9 ratio (ref 0.0–3.2)
Triglycerides: 77 mg/dL (ref 0–149)
VLDL Cholesterol Cal: 13 mg/dL (ref 5–40)

## 2021-12-21 LAB — INSULIN, RANDOM: INSULIN: 19.5 u[IU]/mL (ref 2.6–24.9)

## 2021-12-21 LAB — VITAMIN D 25 HYDROXY (VIT D DEFICIENCY, FRACTURES): Vit D, 25-Hydroxy: 28.8 ng/mL — ABNORMAL LOW (ref 30.0–100.0)

## 2022-01-05 ENCOUNTER — Other Ambulatory Visit: Payer: Self-pay | Admitting: Family Medicine

## 2022-01-05 DIAGNOSIS — Z1231 Encounter for screening mammogram for malignant neoplasm of breast: Secondary | ICD-10-CM

## 2022-01-09 ENCOUNTER — Encounter: Payer: Self-pay | Admitting: Orthopaedic Surgery

## 2022-01-09 ENCOUNTER — Ambulatory Visit (INDEPENDENT_AMBULATORY_CARE_PROVIDER_SITE_OTHER): Payer: BC Managed Care – PPO

## 2022-01-09 ENCOUNTER — Ambulatory Visit (INDEPENDENT_AMBULATORY_CARE_PROVIDER_SITE_OTHER): Payer: BC Managed Care – PPO | Admitting: Orthopaedic Surgery

## 2022-01-09 VITALS — BP 128/81 | HR 77 | Ht 60.0 in | Wt 203.0 lb

## 2022-01-09 DIAGNOSIS — M79645 Pain in left finger(s): Secondary | ICD-10-CM | POA: Diagnosis not present

## 2022-01-09 DIAGNOSIS — M79642 Pain in left hand: Secondary | ICD-10-CM

## 2022-01-09 DIAGNOSIS — M51369 Other intervertebral disc degeneration, lumbar region without mention of lumbar back pain or lower extremity pain: Secondary | ICD-10-CM

## 2022-01-09 DIAGNOSIS — M5136 Other intervertebral disc degeneration, lumbar region: Secondary | ICD-10-CM
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
## 2022-01-16 ENCOUNTER — Ambulatory Visit (INDEPENDENT_AMBULATORY_CARE_PROVIDER_SITE_OTHER): Payer: BC Managed Care – PPO | Admitting: Family Medicine

## 2022-01-16 ENCOUNTER — Encounter (INDEPENDENT_AMBULATORY_CARE_PROVIDER_SITE_OTHER): Payer: Self-pay | Admitting: Family Medicine

## 2022-01-16 VITALS — BP 134/75 | HR 60 | Temp 98.0°F | Ht 60.0 in | Wt 206.0 lb

## 2022-01-16 DIAGNOSIS — Z6841 Body Mass Index (BMI) 40.0 and over, adult: Secondary | ICD-10-CM | POA: Diagnosis not present

## 2022-01-16 DIAGNOSIS — Z7985 Long-term (current) use of injectable non-insulin antidiabetic drugs: Secondary | ICD-10-CM

## 2022-01-16 DIAGNOSIS — E669 Obesity, unspecified: Secondary | ICD-10-CM | POA: Diagnosis not present

## 2022-01-16 DIAGNOSIS — E7800 Pure hypercholesterolemia, unspecified: Secondary | ICD-10-CM | POA: Diagnosis not present

## 2022-01-16 DIAGNOSIS — E1169 Type 2 diabetes mellitus with other specified complication: Secondary | ICD-10-CM | POA: Diagnosis not present

## 2022-01-16 MED ORDER — TIRZEPATIDE 12.5 MG/0.5ML ~~LOC~~ SOAJ: 12.5000 mg | SUBCUTANEOUS | 0 refills | Status: DC

## 2022-01-16 MED ORDER — ATORVASTATIN CALCIUM 40 MG PO TABS: 40.0000 mg | ORAL_TABLET | Freq: Every day | ORAL | 0 refills | Status: DC

## 2022-01-22 ENCOUNTER — Other Ambulatory Visit: Payer: Self-pay | Admitting: Orthopaedic Surgery

## 2022-01-29 NOTE — Progress Notes (Signed)
Chief Complaint:   OBESITY Linda Olson is here to discuss her progress with her obesity treatment plan along with follow-up of her obesity related diagnoses. Linda Olson is on the Category 2 Plan and states she is following her eating plan approximately 10% of the time. Linda Olson states she is doing 0 minutes 0 times per week.  Today's visit was #: 21 Starting weight: 237 lbs Starting date: 04/28/2020 Today's weight: 206 lbs Today's date: 01/16/2022 Total lbs lost to date: 31 Total lbs lost since last in-office visit: 0  Interim History: Linda Olson has struggled with more snacking especially of Halloween leftovers. She tried to journal but she is still getting used to the app.   Subjective:   1. Type 2 diabetes mellitus with other specified complication, without long-term current use of insulin (HCC) Linda Olson is working on her diet, and she notes increased constipation on Mounjaro. She denies hypoglycemia.   2. Pure hypercholesterolemia Linda Olson is on Lipitor with no side effects noted.   Assessment/Plan:   1. Type 2 diabetes mellitus with other specified complication, without long-term current use of insulin (HCC) We will refill Mounjaro for 1 month. Laia is ok to take miralax as needed.   - tirzepatide (MOUNJARO) 12.5 MG/0.5ML Pen; Inject 1 pen (12.5 mg) into the skin once a week.  Dispense: 2 mL; Refill: 0  2. Pure hypercholesterolemia Linda Olson will continue Lipitor 40 mg once daily, and we will refill for 1 month.   - atorvastatin (LIPITOR) 40 MG tablet; Take 1 tablet (40 mg total) by mouth daily.  Dispense: 30 tablet; Refill: 0  3. Obesity, Current BMI 40.2 Linda Olson is currently in the action stage of change. As such, her goal is to continue with weight loss efforts. She has agreed to keeping a food journal and adhering to recommended goals of 1200 calories and 80+ grams of protein daily.   Behavioral modification strategies: increasing lean protein intake and  keeping a strict food journal.  Linda Olson has agreed to follow-up with our clinic in 4 weeks. She was informed of the importance of frequent follow-up visits to maximize her success with intensive lifestyle modifications for her multiple health conditions.   Objective:   Blood pressure 134/75, pulse 60, temperature 98 F (36.7 C), height 5' (1.524 m), weight 206 lb (93.4 kg), SpO2 100 %. Body mass index is 40.23 kg/m.  General: Cooperative, alert, well developed, in no acute distress. HEENT: Conjunctivae and lids unremarkable. Cardiovascular: Regular rhythm.  Lungs: Normal work of breathing. Neurologic: No focal deficits.   Lab Results  Component Value Date   CREATININE 0.87 12/20/2021   BUN 21 12/20/2021   NA 140 12/20/2021   K 4.6 12/20/2021   CL 103 12/20/2021   CO2 24 12/20/2021   Lab Results  Component Value Date   ALT 40 (H) 12/20/2021   AST 33 12/20/2021   ALKPHOS 124 (H) 12/20/2021   BILITOT 0.4 12/20/2021   Lab Results  Component Value Date   HGBA1C 6.0 (H) 12/20/2021   HGBA1C 6.2 (H) 03/30/2021   HGBA1C 6.0 (H) 08/09/2020   HGBA1C 8.2 (H) 04/29/2020   HGBA1C 6.2 (H) 03/19/2019   Lab Results  Component Value Date   INSULIN 19.5 12/20/2021   INSULIN 16.0 08/09/2020   INSULIN 22.1 04/29/2020   Lab Results  Component Value Date   TSH 1.380 03/30/2021   Lab Results  Component Value Date   CHOL 235 (H) 12/20/2021   HDL 57 12/20/2021   LDLCALC 165 (H) 12/20/2021  TRIG 77 12/20/2021   CHOLHDL 4.3 03/30/2021   Lab Results  Component Value Date   VD25OH 28.8 (L) 12/20/2021   VD25OH 40.3 03/30/2021   VD25OH 65.9 08/09/2020   Lab Results  Component Value Date   WBC 7.0 09/04/2021   HGB 12.3 09/04/2021   HCT 39.4 09/04/2021   MCV 91.8 09/04/2021   PLT 308 09/04/2021   Lab Results  Component Value Date   IRON 53 03/30/2021   TIBC 298 03/30/2021   FERRITIN 157 (H) 03/30/2021   Attestation Statements:   Reviewed by clinician on day of visit:  allergies, medications, problem list, medical history, surgical history, family history, social history, and previous encounter notes.   I, Trixie Dredge, am acting as transcriptionist for Dennard Nip, MD.  I have reviewed the above documentation for accuracy and completeness, and I agree with the above. -  Dennard Nip, MD

## 2022-02-07 ENCOUNTER — Telehealth (INDEPENDENT_AMBULATORY_CARE_PROVIDER_SITE_OTHER): Payer: Self-pay | Admitting: Family Medicine

## 2022-02-20 ENCOUNTER — Encounter (INDEPENDENT_AMBULATORY_CARE_PROVIDER_SITE_OTHER): Payer: Self-pay | Admitting: Family Medicine

## 2022-02-20 ENCOUNTER — Ambulatory Visit (INDEPENDENT_AMBULATORY_CARE_PROVIDER_SITE_OTHER): Payer: BC Managed Care – PPO | Admitting: Family Medicine

## 2022-02-20 VITALS — BP 132/81 | HR 71 | Temp 98.0°F | Ht 60.0 in | Wt 207.0 lb

## 2022-02-20 DIAGNOSIS — E1169 Type 2 diabetes mellitus with other specified complication: Secondary | ICD-10-CM

## 2022-02-20 DIAGNOSIS — Z6841 Body Mass Index (BMI) 40.0 and over, adult: Secondary | ICD-10-CM | POA: Diagnosis not present

## 2022-02-20 DIAGNOSIS — Z7985 Long-term (current) use of injectable non-insulin antidiabetic drugs: Secondary | ICD-10-CM

## 2022-02-20 DIAGNOSIS — E669 Obesity, unspecified: Secondary | ICD-10-CM

## 2022-02-20 DIAGNOSIS — E7800 Pure hypercholesterolemia, unspecified: Secondary | ICD-10-CM | POA: Diagnosis not present

## 2022-02-20 MED ORDER — TIRZEPATIDE 12.5 MG/0.5ML ~~LOC~~ SOAJ: 12.5000 mg | SUBCUTANEOUS | 0 refills | Status: DC

## 2022-02-21 DIAGNOSIS — R040 Epistaxis: Secondary | ICD-10-CM | POA: Diagnosis not present

## 2022-03-12 ENCOUNTER — Ambulatory Visit
Admission: RE | Admit: 2022-03-12 | Discharge: 2022-03-12 | Disposition: A | Discharge status: Home / Self Care | Payer: BC Managed Care – PPO | Source: Ambulatory Visit | Attending: Family Medicine | Admitting: Family Medicine

## 2022-03-12 DIAGNOSIS — Z1231 Encounter for screening mammogram for malignant neoplasm of breast: Secondary | ICD-10-CM | POA: Diagnosis not present

## 2022-03-13 NOTE — Progress Notes (Signed)
Chief Complaint:   OBESITY Linda Olson is here to discuss her progress with her obesity treatment plan along with follow-up of her obesity related diagnoses. Linda Olson is on keeping a food journal and adhering to recommended goals of 1200 calories and 80+ grams of protein and states she is following her eating plan approximately (unknown)% of the time. Linda Olson states she is doing 0 minutes 0 times per week.  Today's visit was #: 29 Starting weight: 237 lbs Starting date: 04/28/2020 Today's weight: 207 lbs Today's date: 02/20/2022 Total lbs lost to date: 30 Total lbs lost since last in-office visit: 0  Interim History: Linda Olson has had a lot of of temptations especially at work.  She has done very well with avoiding significant holiday weight gain.  She plans on getting back on track with her eating plan strictly after the holidays are over.  Subjective:   1. Pure hypercholesterolemia Linda Olson is stable on Lipitor, and she is working on her diet to help decrease her LDL and triglycerides.  She denies chest pain.  2. Type 2 diabetes mellitus with other specified complication, without long-term current use of insulin (Linda Olson) Linda Olson continues to do well with Linda Olson.  Her recent A1c was 6.0.  She notes polyuria recently.  Assessment/Plan:   1. Pure hypercholesterolemia Linda Olson is to continue to monitor her cholesterol intake, especially with fatty animal products.  Will continue to monitor.  2. Type 2 diabetes mellitus with other specified complication, without long-term current use of insulin (Linda Olson) Linda Olson will continue with her diet, and will continue Linda Olson 12.5 mg once weekly, and we will refill for 1 month.  - tirzepatide (Linda Olson) 12.5 MG/0.5ML Pen; Inject 1 pen (12.5 mg) into the skin once a week.  Dispense: 2 mL; Refill: 0  3. Obesity, Current BMI 40.6 Linda Olson is currently in the action stage of change. As such, her goal is to continue with weight loss  efforts. She has agreed to keeping a food journal and adhering to recommended goals of 1200 calories and 80+ grams of protein daily.   Behavioral modification strategies: increasing lean protein intake.  Linda Olson has agreed to follow-up with our clinic in 3 to 4 weeks. She was informed of the importance of frequent follow-up visits to maximize her success with intensive lifestyle modifications for her multiple health conditions.   Objective:   Blood pressure 132/81, pulse 71, temperature 98 F (36.7 C), height 5' (1.524 m), weight 207 lb (93.9 kg), SpO2 100 %. Body mass index is 40.43 kg/m.  General: Cooperative, alert, well developed, in no acute distress. HEENT: Conjunctivae and lids unremarkable. Cardiovascular: Regular rhythm.  Lungs: Normal work of breathing. Neurologic: No focal deficits.   Lab Results  Component Value Date   CREATININE 0.87 12/20/2021   BUN 21 12/20/2021   NA 140 12/20/2021   K 4.6 12/20/2021   CL 103 12/20/2021   CO2 24 12/20/2021   Lab Results  Component Value Date   ALT 40 (H) 12/20/2021   AST 33 12/20/2021   ALKPHOS 124 (H) 12/20/2021   BILITOT 0.4 12/20/2021   Lab Results  Component Value Date   HGBA1C 6.0 (H) 12/20/2021   HGBA1C 6.2 (H) 03/30/2021   HGBA1C 6.0 (H) 08/09/2020   HGBA1C 8.2 (H) 04/29/2020   HGBA1C 6.2 (H) 03/19/2019   Lab Results  Component Value Date   INSULIN 19.5 12/20/2021   INSULIN 16.0 08/09/2020   INSULIN 22.1 04/29/2020   Lab Results  Component Value Date   TSH  1.380 03/30/2021   Lab Results  Component Value Date   CHOL 235 (H) 12/20/2021   HDL 57 12/20/2021   LDLCALC 165 (H) 12/20/2021   TRIG 77 12/20/2021   CHOLHDL 4.3 03/30/2021   Lab Results  Component Value Date   VD25OH 28.8 (L) 12/20/2021   VD25OH 40.3 03/30/2021   VD25OH 65.9 08/09/2020   Lab Results  Component Value Date   WBC 7.0 09/04/2021   HGB 12.3 09/04/2021   HCT 39.4 09/04/2021   MCV 91.8 09/04/2021   PLT 308 09/04/2021    Lab Results  Component Value Date   IRON 53 03/30/2021   TIBC 298 03/30/2021   FERRITIN 157 (H) 03/30/2021   Attestation Statements:   Reviewed by clinician on day of visit: allergies, medications, problem list, medical history, surgical history, family history, social history, and previous encounter notes.   I, Trixie Dredge, am acting as transcriptionist for Dennard Nip, MD.  I have reviewed the above documentation for accuracy and completeness, and I agree with the above. -  Dennard Nip, MD

## 2022-03-14 ENCOUNTER — Telehealth (INDEPENDENT_AMBULATORY_CARE_PROVIDER_SITE_OTHER): Payer: Self-pay | Admitting: Family Medicine

## 2022-03-20 ENCOUNTER — Encounter (INDEPENDENT_AMBULATORY_CARE_PROVIDER_SITE_OTHER): Payer: Self-pay | Admitting: Physician Assistant

## 2022-03-20 ENCOUNTER — Other Ambulatory Visit (HOSPITAL_COMMUNITY): Payer: Self-pay

## 2022-03-20 ENCOUNTER — Ambulatory Visit (INDEPENDENT_AMBULATORY_CARE_PROVIDER_SITE_OTHER): Payer: BC Managed Care – PPO | Admitting: Physician Assistant

## 2022-03-20 VITALS — BP 133/72 | HR 79 | Temp 98.4°F | Ht 60.0 in | Wt 210.0 lb

## 2022-03-20 DIAGNOSIS — Z6841 Body Mass Index (BMI) 40.0 and over, adult: Secondary | ICD-10-CM

## 2022-03-20 DIAGNOSIS — E669 Obesity, unspecified: Secondary | ICD-10-CM

## 2022-03-20 DIAGNOSIS — E7800 Pure hypercholesterolemia, unspecified: Secondary | ICD-10-CM | POA: Diagnosis not present

## 2022-03-20 DIAGNOSIS — I498 Other specified cardiac arrhythmias: Secondary | ICD-10-CM

## 2022-03-20 DIAGNOSIS — E1169 Type 2 diabetes mellitus with other specified complication: Secondary | ICD-10-CM

## 2022-03-20 DIAGNOSIS — Z7985 Long-term (current) use of injectable non-insulin antidiabetic drugs: Secondary | ICD-10-CM

## 2022-03-20 MED ORDER — ATORVASTATIN CALCIUM 40 MG PO TABS
40.0000 mg | ORAL_TABLET | Freq: Every day | ORAL | 0 refills | Status: DC
  Filled 2022-03-20: qty 30, 30d supply, fill #0

## 2022-03-20 MED ORDER — TIRZEPATIDE 12.5 MG/0.5ML ~~LOC~~ SOAJ
12.5000 mg | SUBCUTANEOUS | 0 refills | Status: DC
  Filled 2022-03-20: qty 2, 28d supply, fill #0

## 2022-03-27 DIAGNOSIS — R002 Palpitations: Secondary | ICD-10-CM | POA: Diagnosis not present

## 2022-03-27 DIAGNOSIS — E785 Hyperlipidemia, unspecified: Secondary | ICD-10-CM | POA: Diagnosis not present

## 2022-03-27 DIAGNOSIS — E1169 Type 2 diabetes mellitus with other specified complication: Secondary | ICD-10-CM | POA: Diagnosis not present

## 2022-03-27 DIAGNOSIS — E041 Nontoxic single thyroid nodule: Secondary | ICD-10-CM | POA: Diagnosis not present

## 2022-03-27 DIAGNOSIS — I10 Essential (primary) hypertension: Secondary | ICD-10-CM | POA: Diagnosis not present

## 2022-03-27 DIAGNOSIS — E559 Vitamin D deficiency, unspecified: Secondary | ICD-10-CM | POA: Diagnosis not present

## 2022-03-27 NOTE — Progress Notes (Unsigned)
Chief Complaint:   OBESITY Linda Olson is here to discuss her progress with her obesity treatment plan along with follow-up of her obesity related diagnoses. Linda Olson is on keeping a food journal and adhering to recommended goals of 1200 calories and 80+ grams of protein and states she is following her eating plan approximately 50% of the time. Linda Olson states she is exercising 0 minutes 0 times per week.  Today's visit was #: 23 Starting weight: 237 lbs Starting date: 04/28/2020 Today's weight: 210 lbs Today's date: 03/20/2022 Total lbs lost to date: 27 lbs Total lbs lost since last in-office visit: 0  Interim History: Linda Olson has done well with weight loss.  She reports she got off track with plan over the holidays and has been unable to obtain Mounjaro 12.5 mg over the past 3 weeks. No side effects from [facility].  Reports increased hunger/appetite off Mounjaro.  Plans to join [employer] and resume some strengthening exercises.   Subjective:   1. Type 2 diabetes mellitus with other specified complication, without long-term current use of insulin (HCC) Linda Olson is taking Mounjaro 12.5 mg weekly.  Off Mounjaro for 3 weeks.  A1c 6.0, insulin 19.5 on 12/20/21.  Working on decreasing simple carbs,increasing lean protein and exercise to promote weight loss and improve glycemic control.  2. Pure hypercholesterolemia Linda Olson is taking Lipitor 40 m,g daily.  HDL of 57/ LDL of 165-not at goal/trig of 77.  Working on decreasing simple carbs, decreasing saturated fats and cholesterol and exercise to promote weight loss and improve lipid panel.  3. Fluttering heart Linda Olson had 2 episodes felt heart was beating really hard and felt "faint".  Family tried to get her to go to the ER--symptoms resolved on it's own with rest.  Asymptomatic now and denies chest pain, SOB, palpitations or feeling weak today.   Assessment/Plan:   1. Type 2 diabetes mellitus with other specified  complication, without long-term current use of insulin (HCC) Will continue/refill Mounjaro 12.5 mg SQ once weekly for 1 month with 0 refills.  Continue Prescribed Nutrition Plan and exercise and Mounjaro to promote weight loss and improve glycemic control.  -Refill tirzepatide (MOUNJARO) 12.5 MG/0.5ML Pen; Inject 1 pen (12.5 mg) into the skin once a week.  Dispense: 2 mL; Refill: 0  2. Pure hypercholesterolemia Will Continue/refill Lipitor 40 mg daily for 1 month with 0 refills.  Continue Prescribed Nutrition Plan and exercise to promote weight loss and improve lipid panel.  -Refill atorvastatin (LIPITOR) 40 MG tablet; Take 1 tablet (40 mg total) by mouth daily.  Dispense: 30 tablet; Refill: 0  3. Fluttering heart Advised to seek medical attention if reoccurs immediately.  Advised to call PCP today/now for appointment/evaluation as soon as possible.   4. Obesity, Current BMI 41.0 Linda Olson is currently in the action stage of change. As such, her goal is to continue with weight loss efforts. She has agreed to the Category 2 Plan and keeping a food journal and adhering to recommended goals of 1200 calories and 80+ grams of protein daily.   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  Behavioral modification strategies: increasing lean protein intake, decreasing simple carbohydrates, and planning for success.  Linda Olson has agreed to follow-up with our clinic in 4 weeks. She was informed of the importance of frequent follow-up visits to maximize her success with intensive lifestyle modifications for her multiple health conditions.   Objective:   Blood pressure 133/72, pulse 79, temperature 98.4  F (36.9 C), height 5' (1.524 m), weight 210 lb (95.3 kg), SpO2 100 %. Body mass index is 41.01 kg/m.  General: Cooperative, alert, well developed, in no acute distress. HEENT: Conjunctivae and lids unremarkable. Cardiovascular: Regular rhythm.  Lungs:  Normal work of breathing. Neurologic: No focal deficits.   Lab Results  Component Value Date   CREATININE 0.87 12/20/2021   BUN 21 12/20/2021   NA 140 12/20/2021   K 4.6 12/20/2021   CL 103 12/20/2021   CO2 24 12/20/2021   Lab Results  Component Value Date   ALT 40 (H) 12/20/2021   AST 33 12/20/2021   ALKPHOS 124 (H) 12/20/2021   BILITOT 0.4 12/20/2021   Lab Results  Component Value Date   HGBA1C 6.0 (H) 12/20/2021   HGBA1C 6.2 (H) 03/30/2021   HGBA1C 6.0 (H) 08/09/2020   HGBA1C 8.2 (H) 04/29/2020   HGBA1C 6.2 (H) 03/19/2019   Lab Results  Component Value Date   INSULIN 19.5 12/20/2021   INSULIN 16.0 08/09/2020   INSULIN 22.1 04/29/2020   Lab Results  Component Value Date   TSH 1.380 03/30/2021   Lab Results  Component Value Date   CHOL 235 (H) 12/20/2021   HDL 57 12/20/2021   LDLCALC 165 (H) 12/20/2021   TRIG 77 12/20/2021   CHOLHDL 4.3 03/30/2021   Lab Results  Component Value Date   VD25OH 28.8 (L) 12/20/2021   VD25OH 40.3 03/30/2021   VD25OH 65.9 08/09/2020   Lab Results  Component Value Date   WBC 7.0 09/04/2021   HGB 12.3 09/04/2021   HCT 39.4 09/04/2021   MCV 91.8 09/04/2021   PLT 308 09/04/2021   Lab Results  Component Value Date   IRON 53 03/30/2021   TIBC 298 03/30/2021   FERRITIN 157 (H) 03/30/2021   Attestation Statements:   Reviewed by clinician on day of visit: allergies, medications, problem list, medical history, surgical history, family history, social history, and previous encounter notes.  I, Linda Olson, am acting as transcriptionist for [employer], PA.  I have reviewed the above documentation for accuracy and completeness, and I agree with the above. -  Linda Salman,PA-C

## 2022-03-29 ENCOUNTER — Other Ambulatory Visit (HOSPITAL_COMMUNITY): Payer: Self-pay | Admitting: Pain Medicine

## 2022-03-29 DIAGNOSIS — R002 Palpitations: Secondary | ICD-10-CM

## 2022-03-29 DIAGNOSIS — I10 Essential (primary) hypertension: Secondary | ICD-10-CM

## 2022-04-09 DIAGNOSIS — H35033 Hypertensive retinopathy, bilateral: Secondary | ICD-10-CM | POA: Diagnosis not present

## 2022-04-13 DIAGNOSIS — E785 Hyperlipidemia, unspecified: Secondary | ICD-10-CM | POA: Diagnosis not present

## 2022-04-13 DIAGNOSIS — E1169 Type 2 diabetes mellitus with other specified complication: Secondary | ICD-10-CM | POA: Diagnosis not present

## 2022-04-13 DIAGNOSIS — I10 Essential (primary) hypertension: Secondary | ICD-10-CM | POA: Diagnosis not present

## 2022-04-13 DIAGNOSIS — Z0000 Encounter for general adult medical examination without abnormal findings: Secondary | ICD-10-CM | POA: Diagnosis not present

## 2022-04-16 ENCOUNTER — Ambulatory Visit (HOSPITAL_COMMUNITY)
Admission: RE | Admit: 2022-04-16 | Discharge: 2022-04-16 | Disposition: A | Discharge status: Home / Self Care | Payer: BC Managed Care – PPO | Source: Ambulatory Visit | Attending: Pain Medicine | Admitting: Pain Medicine

## 2022-04-16 DIAGNOSIS — R079 Chest pain, unspecified: Secondary | ICD-10-CM | POA: Insufficient documentation

## 2022-04-16 DIAGNOSIS — I10 Essential (primary) hypertension: Secondary | ICD-10-CM | POA: Diagnosis not present

## 2022-04-16 DIAGNOSIS — K219 Gastro-esophageal reflux disease without esophagitis: Secondary | ICD-10-CM | POA: Diagnosis not present

## 2022-04-16 DIAGNOSIS — E785 Hyperlipidemia, unspecified: Secondary | ICD-10-CM | POA: Diagnosis not present

## 2022-04-16 DIAGNOSIS — R002 Palpitations: Secondary | ICD-10-CM

## 2022-04-16 DIAGNOSIS — I7121 Aneurysm of the ascending aorta, without rupture: Secondary | ICD-10-CM | POA: Insufficient documentation

## 2022-04-16 LAB — ECHOCARDIOGRAM COMPLETE
Area-P 1/2: 3.53 cm2
Calc EF: 67.7 %
S' Lateral: 3.2 cm
Single Plane A2C EF: 67.3 %
Single Plane A4C EF: 68.3 %

## 2022-04-16 NOTE — Progress Notes (Signed)
Echocardiogram 2D Echocardiogram has been performed.  Linda Olson 04/16/2022, 9:01 AM

## 2022-04-17 ENCOUNTER — Encounter (INDEPENDENT_AMBULATORY_CARE_PROVIDER_SITE_OTHER): Payer: Self-pay | Admitting: Family Medicine

## 2022-04-17 ENCOUNTER — Ambulatory Visit (INDEPENDENT_AMBULATORY_CARE_PROVIDER_SITE_OTHER): Payer: BC Managed Care – PPO | Admitting: Family Medicine

## 2022-04-17 VITALS — BP 154/83 | HR 68 | Temp 98.1°F | Ht 60.0 in | Wt 209.0 lb

## 2022-04-17 DIAGNOSIS — E7800 Pure hypercholesterolemia, unspecified: Secondary | ICD-10-CM | POA: Diagnosis not present

## 2022-04-17 DIAGNOSIS — Z7985 Long-term (current) use of injectable non-insulin antidiabetic drugs: Secondary | ICD-10-CM

## 2022-04-17 DIAGNOSIS — I712 Thoracic aortic aneurysm, without rupture, unspecified: Secondary | ICD-10-CM

## 2022-04-17 DIAGNOSIS — I10 Essential (primary) hypertension: Secondary | ICD-10-CM | POA: Insufficient documentation

## 2022-04-17 DIAGNOSIS — E1169 Type 2 diabetes mellitus with other specified complication: Secondary | ICD-10-CM

## 2022-04-17 DIAGNOSIS — Z6841 Body Mass Index (BMI) 40.0 and over, adult: Secondary | ICD-10-CM

## 2022-04-17 DIAGNOSIS — E669 Obesity, unspecified: Secondary | ICD-10-CM | POA: Insufficient documentation

## 2022-04-17 MED ORDER — TIRZEPATIDE 12.5 MG/0.5ML ~~LOC~~ SOAJ: 12.5000 mg | SUBCUTANEOUS | 0 refills | Status: DC

## 2022-04-17 MED ORDER — ATORVASTATIN CALCIUM 40 MG PO TABS: 40.0000 mg | ORAL_TABLET | Freq: Every day | ORAL | 0 refills | Status: DC

## 2022-04-19 ENCOUNTER — Telehealth (INDEPENDENT_AMBULATORY_CARE_PROVIDER_SITE_OTHER): Payer: Self-pay | Admitting: Physician Assistant

## 2022-04-19 NOTE — Telephone Encounter (Signed)
Returned patient's call, informed her she could transfer the prescription to pharmacy of her choice. Per patient she is not sure what pharmacy has it in stock. She will call around to check if any pharmacy has it available. I will check the [facility] pharmacies for patient.

## 2022-04-19 NOTE — Telephone Encounter (Signed)
Pt called requesting her RX of Mounjaro to be transferred from [place] to [facility]. Pt states that [facility] is out of the medication. Patient is completely out of the medication. Please call pt at [phone]. [facility].

## 2022-04-21 ENCOUNTER — Emergency Department (HOSPITAL_COMMUNITY): Payer: BC Managed Care – PPO

## 2022-04-21 ENCOUNTER — Observation Stay (HOSPITAL_COMMUNITY)
Admission: EM | Admit: 2022-04-21 | Discharge: 2022-04-22 | Disposition: A | Discharge status: Home / Self Care | Payer: BC Managed Care – PPO | Attending: Emergency Medicine | Admitting: Emergency Medicine

## 2022-04-21 ENCOUNTER — Other Ambulatory Visit: Payer: Self-pay

## 2022-04-21 DIAGNOSIS — R002 Palpitations: Secondary | ICD-10-CM | POA: Diagnosis not present

## 2022-04-21 DIAGNOSIS — R7989 Other specified abnormal findings of blood chemistry: Secondary | ICD-10-CM | POA: Diagnosis not present

## 2022-04-21 DIAGNOSIS — A419 Sepsis, unspecified organism: Secondary | ICD-10-CM | POA: Diagnosis present

## 2022-04-21 DIAGNOSIS — I4891 Unspecified atrial fibrillation: Secondary | ICD-10-CM | POA: Diagnosis not present

## 2022-04-21 DIAGNOSIS — I959 Hypotension, unspecified: Secondary | ICD-10-CM | POA: Diagnosis not present

## 2022-04-21 DIAGNOSIS — R0602 Shortness of breath: Secondary | ICD-10-CM | POA: Diagnosis not present

## 2022-04-21 DIAGNOSIS — J168 Pneumonia due to other specified infectious organisms: Secondary | ICD-10-CM | POA: Diagnosis not present

## 2022-04-21 DIAGNOSIS — R079 Chest pain, unspecified: Secondary | ICD-10-CM | POA: Diagnosis not present

## 2022-04-21 DIAGNOSIS — E119 Type 2 diabetes mellitus without complications: Secondary | ICD-10-CM

## 2022-04-21 DIAGNOSIS — J189 Pneumonia, unspecified organism: Secondary | ICD-10-CM

## 2022-04-21 DIAGNOSIS — I480 Paroxysmal atrial fibrillation: Secondary | ICD-10-CM | POA: Diagnosis present

## 2022-04-21 DIAGNOSIS — Z1152 Encounter for screening for COVID-19: Secondary | ICD-10-CM | POA: Diagnosis not present

## 2022-04-21 DIAGNOSIS — K449 Diaphragmatic hernia without obstruction or gangrene: Secondary | ICD-10-CM | POA: Diagnosis not present

## 2022-04-21 DIAGNOSIS — J9811 Atelectasis: Secondary | ICD-10-CM | POA: Diagnosis not present

## 2022-04-21 DIAGNOSIS — I10 Essential (primary) hypertension: Secondary | ICD-10-CM | POA: Diagnosis present

## 2022-04-21 DIAGNOSIS — R0789 Other chest pain: Secondary | ICD-10-CM | POA: Diagnosis not present

## 2022-04-21 LAB — BASIC METABOLIC PANEL
Anion gap: 14 (ref 5–15)
BUN: 27 mg/dL — ABNORMAL HIGH (ref 6–20)
CO2: 21 mmol/L — ABNORMAL LOW (ref 22–32)
Calcium: 9.4 mg/dL (ref 8.9–10.3)
Chloride: 103 mmol/L (ref 98–111)
Creatinine, Ser: 1.33 mg/dL — ABNORMAL HIGH (ref 0.44–1.00)
GFR, Estimated: 46 mL/min — ABNORMAL LOW (ref >60)
Glucose, Bld: 114 mg/dL — ABNORMAL HIGH (ref 70–99)
Potassium: 3.9 mmol/L (ref 3.5–5.1)
Sodium: 138 mmol/L (ref 135–145)

## 2022-04-21 LAB — I-STAT BETA HCG BLOOD, ED (MC, WL, AP ONLY): I-stat hCG, quantitative: <5 m[IU]/mL (ref <5)

## 2022-04-21 LAB — CBC
HCT: 37.6 % (ref 36.0–46.0)
Hemoglobin: 12.4 g/dL (ref 12.0–15.0)
MCH: 29.4 pg (ref 26.0–34.0)
MCHC: 33 g/dL (ref 30.0–36.0)
MCV: 89.1 fL (ref 80.0–100.0)
Platelets: 325 10*3/uL (ref 150–400)
RBC: 4.22 MIL/uL (ref 3.87–5.11)
RDW: 13.3 % (ref 11.5–15.5)
WBC: 7.7 10*3/uL (ref 4.0–10.5)
nRBC: 0 % (ref 0.0–0.2)

## 2022-04-21 LAB — HEPATIC FUNCTION PANEL
ALT: 35 U/L (ref 0–44)
AST: 35 U/L (ref 15–41)
Albumin: 3.4 g/dL — ABNORMAL LOW (ref 3.5–5.0)
Alkaline Phosphatase: 118 U/L (ref 38–126)
Bilirubin, Direct: <0.1 mg/dL (ref 0.0–0.2)
Total Bilirubin: 0.5 mg/dL (ref 0.3–1.2)
Total Protein: 7.4 g/dL (ref 6.5–8.1)

## 2022-04-21 LAB — TROPONIN I (HIGH SENSITIVITY): Troponin I (High Sensitivity): 4 ng/L (ref <18)

## 2022-04-21 LAB — TSH: TSH: 2.094 u[IU]/mL (ref 0.350–4.500)

## 2022-04-21 LAB — D-DIMER, QUANTITATIVE: D-Dimer, Quant: 0.74 ug/mL-FEU — ABNORMAL HIGH (ref 0.00–0.50)

## 2022-04-21 MED ORDER — DILTIAZEM LOAD VIA INFUSION
10.0000 mg | Freq: Once | INTRAVENOUS | Status: AC
  Administered 2022-04-21: 10 mg via INTRAVENOUS
  Filled 2022-04-21: qty 10

## 2022-04-21 MED ORDER — ONDANSETRON 4 MG PO TBDP
4.0000 mg | ORAL_TABLET | Freq: Once | ORAL | Status: AC
  Administered 2022-04-21: 4 mg via ORAL
  Filled 2022-04-21: qty 1

## 2022-04-21 MED ORDER — LACTATED RINGERS IV BOLUS
1000.0000 mL | Freq: Once | INTRAVENOUS | Status: AC
  Administered 2022-04-21: 1000 mL via INTRAVENOUS

## 2022-04-21 MED ORDER — DILTIAZEM HCL-DEXTROSE 125-5 MG/125ML-% IV SOLN (PREMIX)
5.0000 mg/h | INTRAVENOUS | Status: DC
  Administered 2022-04-21: 5 mg/h via INTRAVENOUS
  Filled 2022-04-21: qty 125

## 2022-04-21 MED ORDER — LACTATED RINGERS IV BOLUS
1000.0000 mL | Freq: Once | INTRAVENOUS | Status: AC
  Administered 2022-04-22: 1000 mL via INTRAVENOUS

## 2022-04-21 NOTE — ED Triage Notes (Signed)
Patient reports central chest pain this evening with SOB and nausea .

## 2022-04-21 NOTE — ED Provider Notes (Signed)
[facility] Emergency Department Provider Note MRN:  [id number]  Arrival date & time: 04/22/22     Chief Complaint   Chest Pain   History of Present Illness   Linda Olson is a 62 y.o. year-old female presents to the ED with chief complaint of heart palpitations, SOB, and nausea.  Onset about 1 hour ago.  Hx of the same.  No diagnosis made on prior workups.  She states she had some cola prior to onset.  Denies any thyroid problems.  She denies fevers, chills, cough, or body aches..  History provided by patient.   Review of Systems  Pertinent positive and negative review of systems noted in HPI.    Physical Exam   Vitals:   04/22/22 0400 04/22/22 0430  BP: 100/61 (!) 89/51  Pulse: (!) 58 (!) 58  Resp: 17 14  Temp:    SpO2: 98% 97%    CONSTITUTIONAL:  non toxic-appearing, NAD NEURO:  Alert and oriented x 3, CN 3-12 grossly intact EYES:  eyes equal and reactive ENT/NECK:  Supple, no stridor  CARDIO:  tachycardic, regular rhythm, appears well-perfused  PULM:  No respiratory distress, CTAB GI/GU:  non-distended,  MSK/SPINE:  No gross deformities, no edema, moves all extremities  SKIN:  no rash, atraumatic   *Additional and/or pertinent findings included in MDM below  Diagnostic and Interventional Summary    EKG Interpretation  Date/Time:  Saturday April 21 2022 23:54:49 EST Ventricular Rate:  71 PR Interval:  186 QRS Duration: 83 QT Interval:  382 QTC Calculation: 416 R Axis:   19 Text Interpretation: Sinus rhythm Ventricular premature complex Probable anteroseptal infarct, old afib has resolved from prior Confirmed by Ripley Fraise [phone]) on 04/22/2022 12:07:35 AM       Labs Reviewed  BASIC METABOLIC PANEL - Abnormal; Notable for the following components:      Result Value   CO2 21 (*)    Glucose, Bld 114 (*)    BUN 27 (*)    Creatinine, Ser 1.33 (*)    GFR, Estimated 46 (*)    All other components within normal  limits  D-DIMER, QUANTITATIVE - Abnormal; Notable for the following components:   D-Dimer, Quant 0.74 (*)    All other components within normal limits  HEPATIC FUNCTION PANEL - Abnormal; Notable for the following components:   Albumin 3.4 (*)    All other components within normal limits  RESP PANEL BY RT-PCR (RSV, FLU A&B, COVID)  RVPGX2  CULTURE, BLOOD (ROUTINE X 2)  CULTURE, BLOOD (ROUTINE X 2)  CBC  TSH  URINALYSIS, ROUTINE W REFLEX MICROSCOPIC  LACTIC ACID, PLASMA  LACTIC ACID, PLASMA  HIV ANTIBODY (ROUTINE TESTING W REFLEX)  PROTIME-INR  CORTISOL-AM, BLOOD  PROCALCITONIN  I-STAT BETA HCG BLOOD, ED ([facility], [facility], [facility] ONLY)  TROPONIN I (HIGH SENSITIVITY)  TROPONIN I (HIGH SENSITIVITY)    CT Angio Chest PE W and/or Wo Contrast  Final Result    DG Chest 2 View  Final Result      Medications  lactated ringers bolus 1,000 mL (has no administration in time range)  atorvastatin (LIPITOR) tablet 40 mg (has no administration in time range)  insulin aspart (novoLOG) injection 0-9 Units (has no administration in time range)  insulin aspart (novoLOG) injection 0-5 Units (has no administration in time range)  enoxaparin (LOVENOX) injection 40 mg (has no administration in time range)  lactated ringers infusion (has no administration in time range)  cefTRIAXone (ROCEPHIN) 2 g in  sodium chloride 0.9 % 100 mL IVPB (has no administration in time range)  azithromycin (ZITHROMAX) 500 mg in sodium chloride 0.9 % 250 mL IVPB (has no administration in time range)  acetaminophen (TYLENOL) tablet 650 mg (has no administration in time range)    Or  acetaminophen (TYLENOL) suppository 650 mg (has no administration in time range)  ondansetron (ZOFRAN) tablet 4 mg (has no administration in time range)    Or  ondansetron (ZOFRAN) injection 4 mg (has no administration in time range)  ondansetron (ZOFRAN-ODT) disintegrating tablet 4 mg (4 mg Oral Given 04/21/22 2239)  lactated ringers bolus 1,000 mL (0 mLs  Intravenous Stopped 04/22/22 0028)  diltiazem (CARDIZEM) 1 mg/mL load via infusion 10 mg (10 mg Intravenous Bolus from Bag 04/21/22 2329)  lactated ringers bolus 1,000 mL (0 mLs Intravenous Stopped 04/22/22 0100)  iohexol (OMNIPAQUE) 350 MG/ML injection 75 mL (75 mLs Intravenous Contrast Given 04/22/22 0423)     Procedures  /  Critical Care .Critical Care  Performed by: Montine Circle, PA-C Authorized by: Montine Circle, PA-C   Critical care provider statement:    Critical care time (minutes):  50   Critical care was necessary to treat or prevent imminent or life-threatening deterioration of the following conditions:  Circulatory failure   Critical care was time spent personally by me on the following activities:  Development of treatment plan with patient or surrogate, discussions with consultants, evaluation of patient's response to treatment, examination of patient, ordering and review of laboratory studies, ordering and review of radiographic studies, ordering and performing treatments and interventions, pulse oximetry, re-evaluation of patient's condition and review of old charts   ED Course and Medical Decision Making  I have reviewed the triage vital signs, the nursing notes, and pertinent available records from the EMR.  Social Determinants Affecting Complexity of Care: Patient has no clinically significant social determinants affecting this chief complaint..   ED Course: Clinical Course as of 04/22/22 0520  Sun Apr 22, 2022  0002 Converted to NS.  BPs in the [id number] systolic.  Getting fluids.  Continuing to monitor. [RB]  [id number] Delay in care, CT ordered at [place], still not done. [RB]    Clinical Course User Index [RB] Montine Circle, PA-C    Medical Decision Making Patient here with palpitations. Onset tonight about an hour prior to arrival.  States that she has experienced similar in the past.  Had recent echo that was normal.    Initially denied other complaints, but on  repeat assessment she says she had a cough and fever earlier in the week, but felt like she had been improving.  Patient has persisted with soft blood pressures.  Feel that she should be admitted to hospital for further treatment.  She did convert to NSR.  Amount and/or Complexity of Data Reviewed Labs: ordered. Radiology: ordered and independent interpretation performed.    Details: No PE seen ECG/medicine tests: ordered and independent interpretation performed.    Details: Afib with RVR   Risk Prescription drug management. Decision regarding hospitalization.     Consultants: I discussed the case with Hospitalist, Dr. Alcario Drought, who is appreciated for admitting.   Treatment and Plan: Patient's exam and diagnostic results are concerning for afib with RVR in setting of right sided pneumonia seen on CT.  Feel that patient will need admission to the hospital for further treatment and evaluation.    Final Clinical Impressions(s) / ED Diagnoses     ICD-10-CM   1. Atrial fibrillation with RVR (  [place])  I48.91     2. Pneumonia of right lung due to infectious organism, unspecified part of lung  J18.9     3. Hypotension, unspecified hypotension type  I95.9       ED Discharge Orders     None         Discharge Instructions Discussed with and Provided to Patient:   Discharge Instructions   None      Montine Circle, PA-C 04/22/22 [id number]    Ripley Fraise, MD 04/22/22 [phone]

## 2022-04-22 ENCOUNTER — Emergency Department (HOSPITAL_COMMUNITY): Payer: BC Managed Care – PPO

## 2022-04-22 DIAGNOSIS — I480 Paroxysmal atrial fibrillation: Secondary | ICD-10-CM | POA: Diagnosis present

## 2022-04-22 DIAGNOSIS — I1A0 Resistant hypertension: Secondary | ICD-10-CM

## 2022-04-22 DIAGNOSIS — R7989 Other specified abnormal findings of blood chemistry: Secondary | ICD-10-CM | POA: Diagnosis not present

## 2022-04-22 DIAGNOSIS — I959 Hypotension, unspecified: Secondary | ICD-10-CM

## 2022-04-22 DIAGNOSIS — E119 Type 2 diabetes mellitus without complications: Secondary | ICD-10-CM

## 2022-04-22 DIAGNOSIS — K449 Diaphragmatic hernia without obstruction or gangrene: Secondary | ICD-10-CM | POA: Diagnosis not present

## 2022-04-22 DIAGNOSIS — I10 Essential (primary) hypertension: Secondary | ICD-10-CM

## 2022-04-22 DIAGNOSIS — A419 Sepsis, unspecified organism: Secondary | ICD-10-CM | POA: Diagnosis not present

## 2022-04-22 DIAGNOSIS — J189 Pneumonia, unspecified organism: Secondary | ICD-10-CM | POA: Diagnosis not present

## 2022-04-22 DIAGNOSIS — J9811 Atelectasis: Secondary | ICD-10-CM | POA: Diagnosis not present

## 2022-04-22 LAB — URINALYSIS, ROUTINE W REFLEX MICROSCOPIC
Bilirubin Urine: NEGATIVE
Glucose, UA: NEGATIVE mg/dL
Hgb urine dipstick: NEGATIVE
Ketones, ur: NEGATIVE mg/dL
Leukocytes,Ua: NEGATIVE
Nitrite: NEGATIVE
Protein, ur: NEGATIVE mg/dL
Specific Gravity, Urine: 1.01 (ref 1.005–1.030)
pH: 6.5 (ref 5.0–8.0)

## 2022-04-22 LAB — BASIC METABOLIC PANEL
Anion gap: 8 (ref 5–15)
BUN: 24 mg/dL — ABNORMAL HIGH (ref 6–20)
CO2: 22 mmol/L (ref 22–32)
Calcium: 8.5 mg/dL — ABNORMAL LOW (ref 8.9–10.3)
Chloride: 107 mmol/L (ref 98–111)
Creatinine, Ser: 0.98 mg/dL (ref 0.44–1.00)
GFR, Estimated: >60 mL/min (ref >60)
Glucose, Bld: 102 mg/dL — ABNORMAL HIGH (ref 70–99)
Potassium: 4.1 mmol/L (ref 3.5–5.1)
Sodium: 137 mmol/L (ref 135–145)

## 2022-04-22 LAB — PROTIME-INR
INR: 1.1 (ref 0.8–1.2)
Prothrombin Time: 13.9 seconds (ref 11.4–15.2)

## 2022-04-22 LAB — RESP PANEL BY RT-PCR (RSV, FLU A&B, COVID)  RVPGX2
Influenza A by PCR: NEGATIVE
Influenza B by PCR: NEGATIVE
Resp Syncytial Virus by PCR: NEGATIVE
SARS Coronavirus 2 by RT PCR: NEGATIVE

## 2022-04-22 LAB — CBG MONITORING, ED: Glucose-Capillary: 99 mg/dL (ref 70–99)

## 2022-04-22 LAB — PROCALCITONIN: Procalcitonin: <0.1 ng/mL

## 2022-04-22 LAB — TROPONIN I (HIGH SENSITIVITY): Troponin I (High Sensitivity): 5 ng/L (ref <18)

## 2022-04-22 LAB — LACTIC ACID, PLASMA: Lactic Acid, Venous: 0.8 mmol/L (ref 0.5–1.9)

## 2022-04-22 LAB — CORTISOL-AM, BLOOD: Cortisol - AM: 22.9 ug/dL — ABNORMAL HIGH (ref 6.7–22.6)

## 2022-04-22 LAB — HIV ANTIBODY (ROUTINE TESTING W REFLEX): HIV Screen 4th Generation wRfx: NONREACTIVE

## 2022-04-22 MED ORDER — ACETAMINOPHEN 650 MG RE SUPP: 650.0000 mg | Freq: Four times a day (QID) | RECTAL | Status: DC | PRN

## 2022-04-22 MED ORDER — RIVAROXABAN 10 MG PO TABS: 20.0000 mg | ORAL_TABLET | Freq: Every day | ORAL | Status: DC

## 2022-04-22 MED ORDER — SODIUM CHLORIDE 0.9 % IV SOLN: 500.0000 mg | Freq: Once | INTRAVENOUS | Status: DC

## 2022-04-22 MED ORDER — ONDANSETRON HCL 4 MG PO TABS: 4.0000 mg | ORAL_TABLET | Freq: Four times a day (QID) | ORAL | Status: DC | PRN

## 2022-04-22 MED ORDER — SODIUM CHLORIDE 0.9 % IV SOLN: 1.0000 g | Freq: Once | INTRAVENOUS | Status: DC

## 2022-04-22 MED ORDER — IOHEXOL 350 MG/ML SOLN
75.0000 mL | Freq: Once | INTRAVENOUS | Status: AC | PRN
  Administered 2022-04-22: 75 mL via INTRAVENOUS

## 2022-04-22 MED ORDER — ONDANSETRON HCL 4 MG/2ML IJ SOLN: 4.0000 mg | Freq: Four times a day (QID) | INTRAMUSCULAR | Status: DC | PRN

## 2022-04-22 MED ORDER — INSULIN ASPART 100 UNIT/ML IJ SOLN: 0.0000 [IU] | Freq: Every day | INTRAMUSCULAR | Status: DC

## 2022-04-22 MED ORDER — RIVAROXABAN 20 MG PO TABS: 20.0000 mg | ORAL_TABLET | Freq: Every day | ORAL | 1 refills | Status: DC

## 2022-04-22 MED ORDER — LACTATED RINGERS IV SOLN: INTRAVENOUS | Status: DC

## 2022-04-22 MED ORDER — ATORVASTATIN CALCIUM 40 MG PO TABS
40.0000 mg | ORAL_TABLET | Freq: Every day | ORAL | Status: DC
  Administered 2022-04-22: 40 mg via ORAL
  Filled 2022-04-22: qty 1

## 2022-04-22 MED ORDER — ACETAMINOPHEN 325 MG PO TABS: 650.0000 mg | ORAL_TABLET | Freq: Four times a day (QID) | ORAL | Status: DC | PRN

## 2022-04-22 MED ORDER — LACTATED RINGERS IV BOLUS
1000.0000 mL | Freq: Once | INTRAVENOUS | Status: AC
  Administered 2022-04-22: 1000 mL via INTRAVENOUS

## 2022-04-22 MED ORDER — SODIUM CHLORIDE 0.9 % IV SOLN
500.0000 mg | INTRAVENOUS | Status: DC
  Administered 2022-04-22: 500 mg via INTRAVENOUS
  Filled 2022-04-22: qty 5

## 2022-04-22 MED ORDER — HEPARIN (PORCINE) 25000 UT/250ML-% IV SOLN
1000.0000 [IU]/h | INTRAVENOUS | Status: DC
  Administered 2022-04-22: 1000 [IU]/h via INTRAVENOUS
  Filled 2022-04-22: qty 250

## 2022-04-22 MED ORDER — SODIUM CHLORIDE 0.9 % IV SOLN
2.0000 g | INTRAVENOUS | Status: DC
  Administered 2022-04-22: 2 g via INTRAVENOUS
  Filled 2022-04-22: qty 20

## 2022-04-22 MED ORDER — HEPARIN BOLUS VIA INFUSION
4000.0000 [IU] | Freq: Once | INTRAVENOUS | Status: AC
  Administered 2022-04-22: 4000 [IU] via INTRAVENOUS
  Filled 2022-04-22: qty 4000

## 2022-04-22 MED ORDER — ENOXAPARIN SODIUM 40 MG/0.4ML IJ SOSY
40.0000 mg | PREFILLED_SYRINGE | INTRAMUSCULAR | Status: DC
  Administered 2022-04-22: 40 mg via SUBCUTANEOUS
  Filled 2022-04-22: qty 0.4

## 2022-04-22 MED ORDER — INSULIN ASPART 100 UNIT/ML IJ SOLN: 0.0000 [IU] | Freq: Three times a day (TID) | INTRAMUSCULAR | Status: DC

## 2022-04-22 NOTE — ED Notes (Signed)
Reesa Chew, MD notified of orthostatic vital results.  Advised patient was dizzy and seeing spots while standing.

## 2022-04-22 NOTE — H&P (Addendum)
History and Physical    Patient: Linda Olson [id number] DOB: 04-10-1960 DOA: 04/21/2022 DOS: the patient was seen and examined on 04/22/2022 PCP: [name], MD  Patient coming from: Home  Chief Complaint:  Chief Complaint  Patient presents with   Chest Pain   HPI: Linda Olson is a 62 y.o. female with medical history significant of HTN, DM2, OBESITY.  Pt in to ED with c/o heart palpitations, SOB, nausea.  Symptoms onset ~1h PTA, h/o same, no dx on prior work ups.  Had some cola (? Caffeine) prior to onset.  No h/o thyroid problems.  No fevers, chills, cough, body aches.  Did have fever + cough earlier in week, but this seems to have gotten better per patient.  Pt endorses chronic orthostatic symptoms in recent months, 60-70 lbs wt loss over past 12 months since she started the [employer].  Review of Systems: As mentioned in the history of present illness. All other systems reviewed and are negative. Past Medical History:  Diagnosis Date   Asthma    Back pain    Bell's palsy    BMI 40.0-44.9, adult (HCC)    Chest pain    Chewing difficulty    Constipation    GERD (gastroesophageal reflux disease)    Hyperlipidemia    Hypertension    Low vitamin D level    per notes from [place]   Obesity    per notes from [facility] [place]   Orbital floor fracture [facility])    [place] [facility]; per notes from [place]   Prediabetes    per notes from [place]   Preeclampsia    per notes from [place]   Past Surgical History:  Procedure Laterality Date   BACK SURGERY     COLONOSCOPY     per notes from [place] ARTHROSCOPY     VAGINAL HYSTERECTOMY     Social History:  reports that she has never smoked. She has never used smokeless tobacco. She reports that she does not drink alcohol and does not use drugs.  Allergies  Allergen Reactions   Metformin And Related Diarrhea and Nausea Only   Other Other (See Comments)     OXYCODONE Pain medication after back surgery in 2014 caused delirium - pt was told not to take again and to stay awake all night after taking   Oxycontin [Oxycodone]     Delirious, headache    Family History  Problem Relation Age of Onset   Hypertension Mother    Hypertension Father    Hyperlipidemia Father    High blood pressure Sister    Stroke Maternal Aunt    Breast cancer Neg Hx     Prior to Admission medications   Medication Sig Start Date End Date Taking? Authorizing Provider  atorvastatin (LIPITOR) 40 MG tablet Take 1 tablet (40 mg total) by mouth daily. 04/17/22  Yes Beasley, Caren D, MD  ibuprofen (ADVIL) 800 MG tablet TAKE 1 TABLET BY MOUTH EVERY 12 HOURS AS NEEDED Patient taking differently: Take 800 mg by mouth 2 (two) times daily as needed for moderate pain. 01/22/22  Yes Marybelle Killings, MD  losartan-hydrochlorothiazide (HYZAAR) 50-12.5 MG tablet Take 1 tablet by mouth daily. 07/21/18  Yes [provider]  methocarbamol (ROBAXIN) 500 MG tablet Take 1 tablet (500 mg total) by mouth every 8 (eight) hours as needed for muscle spasms. Patient taking differently: Take 500 mg by mouth daily as needed for muscle spasms. 09/20/21  Yes Lanae Crumbly, PA-C  methocarbamol (ROBAXIN) 500 MG tablet Take 1 tablet (500 mg total) by mouth 2 (two) times daily. 09/04/21   Garald Balding, PA-C  tirzepatide [facility]) 12.5 MG/0.5ML Pen Inject 1 pen (12.5 mg) into the skin once a week. Patient not taking: Reported on 04/22/2022 04/17/22   Dennard Nip D, MD    Physical Exam: Vitals:   04/22/22 0300 04/22/22 0315 04/22/22 0400 04/22/22 0430  BP: 98/62 (!) 86/61 100/61 (!) 89/51  Pulse: 62 71 (!) 58 (!) 58  Resp: (!) 7 18 17 14  $ Temp:      TempSrc:      SpO2: 97% 96% 98% 97%   Constitutional: NAD, calm, comfortable Respiratory: clear to auscultation bilaterally, no wheezing, no crackles. Normal respiratory effort. No accessory muscle use.  Cardiovascular: Regular rate and rhythm at  time of exam, no murmurs / rubs / gallops. No extremity edema. 2+ pedal pulses. No carotid bruits.  Abdomen: no tenderness, no masses palpated. No hepatosplenomegaly. Bowel sounds positive.  Neurologic: CN 2-12 grossly intact. Sensation intact, DTR normal. Strength 5/5 in all 4.  Psychiatric: Normal judgment and insight. Alert and oriented x 3. Normal mood.   Data Reviewed:        Latest Ref Rng & Units 04/21/2022   10:46 PM 09/04/2021    9:00 AM 03/30/2021    7:55 AM  CBC  WBC 4.0 - 10.5 K/uL 7.7  7.0  5.3   Hemoglobin 12.0 - 15.0 g/dL 12.4  12.3  12.4   Hematocrit 36.0 - 46.0 % 37.6  39.4  37.1   Platelets 150 - 400 K/uL 325  308  366       Latest Ref Rng & Units 04/21/2022   10:46 PM 12/20/2021   10:52 AM 09/04/2021    9:00 AM  CMP  Glucose 70 - 99 mg/dL 114  85  119   BUN 6 - 20 mg/dL 27  21  23   $ Creatinine 0.44 - 1.00 mg/dL 1.33  0.87  0.83   Sodium 135 - 145 mmol/L 138  140  138   Potassium 3.5 - 5.1 mmol/L 3.9  4.6  4.0   Chloride 98 - 111 mmol/L 103  103  105   CO2 22 - 32 mmol/L 21  24  24   $ Calcium 8.9 - 10.3 mg/dL 9.4  9.5  9.5   Total Protein 6.5 - 8.1 g/dL 7.4  7.1    Total Bilirubin 0.3 - 1.2 mg/dL 0.5  0.4    Alkaline Phos 38 - 126 U/L 118  124    AST 15 - 41 U/L 35  33    ALT 0 - 44 U/L 35  40     EKG: 1st EKG shows A.Fib RVR with rate of 145.  2nd EKG shows NSR  Trop nl x2  2D echo from 04/16/22 (6 days ago): IMPRESSIONS     1. Left ventricular ejection fraction, by estimation, is 60 to 65%. The  left ventricle has normal function. The left ventricle has no regional  wall motion abnormalities. Left ventricular diastolic parameters were  normal.   2. Right ventricular systolic function is normal. The right ventricular  size is normal.   3. No evidence of mitral valve regurgitation.   4. The aortic valve is tricuspid. There is mild calcification of the  aortic valve. Aortic valve regurgitation is not visualized.   5. Aneurysm of the ascending aorta,  measuring 42 mm.  6. The inferior vena cava is normal in size with greater than 50%  respiratory variability, suggesting right atrial pressure of 3 mmHg.   Comparison(s): No significant change from prior study.   Conclusion(s)/Recommendation(s): Normal biventricular function without  evidence of hemodynamically significant valvular heart disease.   TSH nl  CTA chest: IMPRESSION: 1. Pneumonia on the right. 2. Negative for pulmonary embolism.   Assessment and Plan: * Paroxysmal atrial fibrillation with RVR (HCC) Pt with a.fib RVR at time of presentation. Seems to have converted out of this back into NSR on repeat EKG.  Got just 74m cardizem bolus. CHADS-VASC = 3 Heparin gtt Discussed stroke risk with pt and rationale behind AC in A.Fib Can't really start any further node blocking agents due to hypotension. Tele monitor Just had 2d echo a couple of days ago: nl EF.  Sepsis associated hypotension (HCC) Question of sepsis secondary to question of PNA, with hypotension. DDx includes resolved PNA with lag time on imaging study; pulmonary edema / CHF secondary to A.Fib RVR (though seems kinda unilateral for this, also just had 2d echo a couple of days ago that was unimpressive for CHF), and hypotension secondary to cardizem, though she only got a 1110mbolus of this.  I'm more thinking that: A) PNA was mild and earlier this week, and imaging lagging behind clinical picture (clinical picture being one of recovery from respiratory illness already) B) Pts low blood pressure is actually more of a chronic issue and is secondary to massive weight loss over past 12 months (pt thinks maybe 60-70 lbs?) courtesy of [place].  Regardless, will treat for PNA + Sepsis at this time because pt has persistent soft BPs in ED.  At least until we can be completely convinced that she has ruled out for sepsis: Sepsis pathway Getting 3L IVF bolus in ED then 125 cc/hr Rocephin + azithro Lactate  pending Procalcitonin pending Check covid, flu, RSV BCx pending  Diabetes mellitus (HCEleanorSensitive SSI AC/HS for the moment  HTN (hypertension) Hold all home BP meds in setting of sepsis associated hypotension.      Advance Care Planning:   Code Status: Full Code  Consults: None  Family Communication: Family at bedside  Severity of Illness: The appropriate patient status for this patient is INPATIENT. Inpatient status is judged to be reasonable and necessary in order to provide the required intensity of service to ensure the patient's safety. The patient's presenting symptoms, physical exam findings, and initial radiographic and laboratory data in the context of their chronic comorbidities is felt to place them at high risk for further clinical deterioration. Furthermore, it is not anticipated that the patient will be medically stable for discharge from the hospital within 2 midnights of admission.   * I certify that at the point of admission it is my clinical judgment that the patient will require inpatient hospital care spanning beyond 2 midnights from the point of admission due to high intensity of service, high risk for further deterioration and high frequency of surveillance required.*  Author: GAEtta Quill DO 04/22/2022 5:06 AM  For on call review [email or web address]

## 2022-04-22 NOTE — Assessment & Plan Note (Addendum)
Pt with a.fib RVR at time of presentation. Seems to have converted out of this back into NSR on repeat EKG.  Got just 50m cardizem bolus. CHADS-VASC = 3 Heparin gtt Discussed stroke risk with pt and rationale behind AC in A.Fib Can't really start any further node blocking agents due to hypotension. Tele monitor Just had 2d echo a couple of days ago: nl EF.

## 2022-04-22 NOTE — Assessment & Plan Note (Signed)
Hold all home BP meds in setting of sepsis associated hypotension.

## 2022-04-22 NOTE — Discharge Summary (Signed)
Physician Discharge Summary   Patient: Linda Olson MRN: [id number] DOB: [birth year]  Admit date:     04/21/2022  Discharge date: 04/22/22  Discharge Physician: Lorella Nimrod   PCP: [name], MD   Recommendations at discharge:  Please obtain CBC and BMP in 1 week Follow-up with primary care provider within a week Patient is being discharged on Xarelto for 1 episode of atrial fibrillation, currently in sinus rhythm.  Please reassess her need to stay on anticoagulation. Patient might get benefit from following up with a cardiologist  Discharge Diagnoses: Principal Problem:   Paroxysmal atrial fibrillation with RVR ([place]) Active Problems:   Sepsis associated hypotension (HCC)   Pneumonia involving right lung   HTN (hypertension)   Diabetes mellitus [facility])   Hospital Course: Taken from H&P.  Linda Olson is a 62 y.o. female with medical history significant of hypertension, type 2 diabetes and obesity presented to ED with complaint of heart palpitations, shortness of breath, and nausea, started about an hour before presentation.  She drank some cola. (? Caffeine) prior to onset. No h/o thyroid problems.   Pt endorses chronic orthostatic symptoms in recent months, 60-70 lbs wt loss over past 12 months since she started the [facility].   ED course.  Patient was hypotensive at 72/52, fluid responsive.  Labs with mild AKI, creatinine at 1.33 with baseline less than 1.  TSH normal, troponin negative x 2, rest of the labs unremarkable except mildly elevated D-dimer at 0.74. First EKG in ER shows A-fib with RVR, spontaneously converted back to sinus rhythm with 1 dose of Cardizem 10 mg.  Patient has echocardiogram recently done on 04/16/2022 which was normal. Chest x-ray without any acute abnormality, CTA negative for PE, did show a mild patchy infiltrate on right base.  Patient initially started on sepsis protocol based on tachycardia, tachypnea and hypotension. Concern of  pneumonia.  She received ceftriaxone and Zithromax along with IVF.  2/18: Vital stable.  Procalcitonin and lactic acid negative.  Patient does not have any upper respiratory symptoms.  No need for any antibiotics.  She might have some prior viral illness.  She just had 1 episode of A-fib which converted back to sinus with Cardizem bolus.   CHADS-VASC = 3 , discussed with patient and she was started on Xarelto for stroke prevention.  She might not need it for a long time if remained in sinus rhythm and no more episodes.  Patient was advised to discuss with her cardiologist for further management.  Recent echocardiogram couple of days ago was normal.  Respiratory panel was negative.  Patient to complain about some dizziness with ambulation, negative orthostatic vitals.  No hypoxia.  Patient will continue with the rest of her home medications and need to have a close follow-up with her providers for further recommendations.   Assessment and Plan: * Paroxysmal atrial fibrillation with RVR (HCC) Pt with a.fib RVR at time of presentation. Seems to have converted out of this back into NSR on repeat EKG.  Got just 64m cardizem bolus. CHADS-VASC = 3 Heparin gtt Discussed stroke risk with pt and rationale behind AC in A.Fib Can't really start any further node blocking agents due to hypotension. Tele monitor Just had 2d echo a couple of days ago: nl EF.  Sepsis associated hypotension ([facility] Sepsis ruled out.  Antibiotics were discontinued  Diabetes mellitus ([place] Sensitive SSI AC/HS for the moment  HTN (hypertension) Hold all home BP meds in setting of sepsis associated hypotension.  Consultants: None Procedures performed: None Disposition: Home Diet recommendation:  Discharge Diet Orders (From admission, onward)     Start     Ordered   04/22/22 0000  Diet - low sodium heart healthy        04/22/22 1417           Cardiac and Carb modified diet DISCHARGE MEDICATION: Allergies as  of 04/22/2022       Reactions   Metformin And Related Diarrhea, Nausea Only   Other Other (See Comments)   OXYCODONE Pain medication after back surgery in 2014 caused delirium - pt was told not to take again and to stay awake all night after taking   Oxycontin [oxycodone]    Delirious, headache        Medication List     STOP taking these medications    ibuprofen 800 MG tablet Commonly known as: ADVIL       TAKE these medications    atorvastatin 40 MG tablet Commonly known as: LIPITOR Take 1 tablet (40 mg total) by mouth daily.   losartan-hydrochlorothiazide 50-12.5 MG tablet Commonly known as: HYZAAR Take 1 tablet by mouth daily.   methocarbamol 500 MG tablet Commonly known as: ROBAXIN Take 1 tablet (500 mg total) by mouth every 8 (eight) hours as needed for muscle spasms. What changed:  when to take this Another medication with the same name was removed. Continue taking this medication, and follow the directions you see here.   rivaroxaban 20 MG Tabs tablet Commonly known as: XARELTO Take 1 tablet (20 mg total) by mouth daily with supper.   tirzepatide 12.5 MG/0.5ML Pen Commonly known as: MOUNJARO Inject 1 pen (12.5 mg) into the skin once a week.        Discharge Exam: Filed Weights   04/22/22 0500  Weight: 94.8 kg   General.  Morbidly obese lady, in no acute distress. Pulmonary.  Lungs clear bilaterally, normal respiratory effort. CV.  Regular rate and rhythm, no JVD, rub or murmur. Abdomen.  Soft, nontender, nondistended, BS positive. CNS.  Alert and oriented .  No focal neurologic deficit. Extremities.  No edema, no cyanosis, pulses intact and symmetrical. Psychiatry.  Judgment and insight appears normal.   Condition at discharge: stable  The results of significant diagnostics from this hospitalization (including imaging, microbiology, ancillary and laboratory) are listed below for reference.   Imaging Studies: CT Angio Chest PE W and/or Wo  Contrast  Result Date: 04/22/2022 CLINICAL DATA:  Positive D-dimer, evaluate for pulmonary embolism EXAM: CT ANGIOGRAPHY CHEST WITH CONTRAST TECHNIQUE: Multidetector CT imaging of the chest was performed using the standard protocol during bolus administration of intravenous contrast. Multiplanar CT image reconstructions and MIPs were obtained to evaluate the vascular anatomy. RADIATION DOSE REDUCTION: This exam was performed according to the departmental dose-optimization program which includes automated exposure control, adjustment of the mA and/or kV according to patient size and/or use of iterative reconstruction technique. CONTRAST:  76m OMNIPAQUE IOHEXOL 350 MG/ML SOLN COMPARISON:  None Available. FINDINGS: Cardiovascular: Satisfactory opacification of the pulmonary arteries to the segmental level. No evidence of pulmonary embolism. Normal heart size for low volume chest. No pericardial effusion. Coronary atherosclerosis. Mediastinum/Nodes: Negative for adenopathy or mass. Tiny hiatal hernia. Lungs/Pleura: Patchy airspace disease in the right lung. Mild dependent atelectasis. Upper Abdomen: Negative generalized thoracic spondylosis. Musculoskeletal: Generalized thoracic disc narrowing and spondylitic spurring. Review of the MIP images confirms the above findings. IMPRESSION: 1. Pneumonia on the right. 2. Negative for pulmonary embolism. Electronically Signed  By: Jorje Guild M.D.   On: 04/22/2022 04:31   DG Chest 2 View  Result Date: 04/21/2022 CLINICAL DATA:  Chest pain EXAM: CHEST - 2 VIEW COMPARISON:  09/04/2021 FINDINGS: Frontal and lateral views of the chest demonstrate an unremarkable cardiac silhouette. No acute airspace disease, effusion, or pneumothorax. No acute bony abnormalities. IMPRESSION: 1. No acute intrathoracic process. Electronically Signed   By: Randa Ngo M.D.   On: 04/21/2022 22:52   ECHOCARDIOGRAM COMPLETE  Result Date: 04/16/2022    ECHOCARDIOGRAM REPORT   Patient  Name:   Linda Olson Date of Exam: 04/16/2022 Medical Rec #:  [id number]           Height:       60.0 in Accession #:    [id number]          Weight:       210.0 lb Date of Birth:  1960/06/30           BSA:          1.906 m Patient Age:    54 years            BP:           133/72 mmHg Patient Gender: F                   HR:           57 bpm. Exam Location:  Outpatient Procedure: 2D Echo, 3D Echo, Cardiac Doppler, Color Doppler and Olson Analysis Indications:    Palpitations [785.1.ICD-9-CM]  History:        Patient has prior history of Echocardiogram examinations, most                 recent 03/19/2019. Signs/Symptoms:Chest Pain; Risk                 Factors:Hypertension, Dyslipidemia and GERD.  Sonographer:    Bernadene Person RDCS Referring Phys: [id number] AMANDA T LAWSON  Sonographer Comments: Global longitudinal Olson was attempted. IMPRESSIONS  1. Left ventricular ejection fraction, by estimation, is 60 to 65%. The left ventricle has normal function. The left ventricle has no regional wall motion abnormalities. Left ventricular diastolic parameters were normal.  2. Right ventricular systolic function is normal. The right ventricular size is normal.  3. No evidence of mitral valve regurgitation.  4. The aortic valve is tricuspid. There is mild calcification of the aortic valve. Aortic valve regurgitation is not visualized.  5. Aneurysm of the ascending aorta, measuring 42 mm.  6. The inferior vena cava is normal in size with greater than 50% respiratory variability, suggesting right atrial pressure of 3 mmHg. Comparison(s): No significant change from prior study. Conclusion(s)/Recommendation(s): Normal biventricular function without evidence of hemodynamically significant valvular heart disease. FINDINGS  Left Ventricle: Left ventricular ejection fraction, by estimation, is 60 to 65%. The left ventricle has normal function. The left ventricle has no regional wall motion abnormalities. The left ventricular  internal cavity size was normal in size. There is  no left ventricular hypertrophy. Left ventricular diastolic parameters were normal. Right Ventricle: The right ventricular size is normal. Right ventricular systolic function is normal. Left Atrium: Left atrial size was normal in size. Right Atrium: Right atrial size was normal in size. Pericardium: There is no evidence of pericardial effusion. Mitral Valve: No evidence of mitral valve regurgitation. Tricuspid Valve: Tricuspid valve regurgitation is not demonstrated. Aortic Valve: The aortic valve is tricuspid. There is mild calcification of the aortic valve. Aortic  valve regurgitation is not visualized. Pulmonic Valve: Pulmonic valve regurgitation is not visualized. Aorta: There is an aneurysm involving the ascending aorta measuring 42 mm. Venous: The inferior vena cava is normal in size with greater than 50% respiratory variability, suggesting right atrial pressure of 3 mmHg. IAS/Shunts: No atrial level shunt detected by color flow Doppler.  LEFT VENTRICLE PLAX 2D LVIDd:         5.30 cm      Diastology LVIDs:         3.20 cm      LV e' medial:    7.30 cm/s LV PW:         0.70 cm      LV E/e' medial:  9.1 LV IVS:        0.70 cm      LV e' lateral:   13.10 cm/s LVOT diam:     2.10 cm      LV E/e' lateral: 5.1 LV SV:         78 LV SV Index:   41 LVOT Area:     3.46 cm                              3D Volume EF: LV Volumes (MOD)            3D EF:        58 % LV vol d, MOD A2C: 80.8 ml  LV EDV:       142 ml LV vol d, MOD A4C: 104.0 ml LV ESV:       60 ml LV vol s, MOD A2C: 26.4 ml  LV SV:        82 ml LV vol s, MOD A4C: 33.0 ml LV SV MOD A2C:     54.4 ml LV SV MOD A4C:     104.0 ml LV SV MOD BP:      62.6 ml RIGHT VENTRICLE RV S prime:     11.40 cm/s TAPSE (M-mode): 1.8 cm LEFT ATRIUM             Index        RIGHT ATRIUM           Index LA diam:        3.70 cm 1.94 cm/m   RA Area:     12.50 cm LA Vol (A2C):   49.7 ml 26.08 ml/m  RA Volume:   28.10 ml  14.75 ml/m  LA Vol (A4C):   36.0 ml 18.89 ml/m LA Biplane Vol: 43.3 ml 22.72 ml/m  AORTIC VALVE LVOT Vmax:   96.20 cm/s LVOT Vmean:  60.900 cm/s LVOT VTI:    0.224 m  AORTA Ao Root diam: 3.40 cm Ao Asc diam:  4.20 cm MITRAL VALVE MV Area (PHT): 3.53 cm    SHUNTS MV Decel Time: 215 msec    Systemic VTI:  0.22 m MV E velocity: 66.50 cm/s  Systemic Diam: 2.10 cm MV A velocity: 43.50 cm/s MV E/A ratio:  1.53 Landscape architect signed by Phineas Inches Signature Date/Time: 04/16/2022/12:52:14 PM    Final     Microbiology: Results for orders placed or performed during the hospital encounter of 04/21/22  Resp panel by RT-PCR (RSV, Flu A&B, Covid) Peripheral     Status: None   Collection Time: 04/22/22  4:44 AM   Specimen: Peripheral; Nasal Swab  Result Value Ref Range Status   SARS Coronavirus 2 by  RT PCR NEGATIVE NEGATIVE Final   Influenza A by PCR NEGATIVE NEGATIVE Final   Influenza B by PCR NEGATIVE NEGATIVE Final    Comment: (NOTE) The Xpert Xpress SARS-CoV-2/FLU/RSV plus assay is intended as an aid in the diagnosis of influenza from Nasopharyngeal swab specimens and should not be used as a sole basis for treatment. Nasal washings and aspirates are unacceptable for Xpert Xpress SARS-CoV-2/FLU/RSV testing.  Fact Sheet for Patients: EntrepreneurPulse.com.au  Fact Sheet for Healthcare Providers: IncredibleEmployment.be  This test is not yet approved or cleared by the [place] FDA and has been authorized for detection and/or diagnosis of SARS-CoV-2 by FDA under an Emergency Use Authorization (EUA). This EUA will remain in effect (meaning this test can be used) for the duration of the COVID-19 declaration under Section 564(b)(1) of the Act, 21 U.S.C. section 360bbb-3(b)(1), unless the authorization is terminated or revoked.     Resp Syncytial Virus by PCR NEGATIVE NEGATIVE Final    Comment: (NOTE) Fact Sheet for  Patients: EntrepreneurPulse.com.au  Fact Sheet for Healthcare Providers: IncredibleEmployment.be  This test is not yet approved or cleared by the [place] FDA and has been authorized for detection and/or diagnosis of SARS-CoV-2 by FDA under an Emergency Use Authorization (EUA). This EUA will remain in effect (meaning this test can be used) for the duration of the COVID-19 declaration under Section 564(b)(1) of the Act, 21 U.S.C. section 360bbb-3(b)(1), unless the authorization is terminated or revoked.  Performed at [facility], [place] [address] [place], [place] [address]   Blood culture (routine x 2)     Status: None (Preliminary result)   Collection Time: 04/22/22  4:44 AM   Specimen: BLOOD  Result Value Ref Range Status   Specimen Description BLOOD SITE NOT SPECIFIED  Final   Special Requests   Final    BOTTLES DRAWN AEROBIC AND ANAEROBIC Blood Culture results may not be optimal due to an excessive volume of blood received in culture bottles   Culture   Final    NO GROWTH < 12 HOURS Performed at [facility], [place] [address], [place], [place] [address]    Report Status PENDING  Incomplete  Blood culture (routine x 2)     Status: None (Preliminary result)   Collection Time: 04/22/22  4:55 AM   Specimen: BLOOD RIGHT HAND  Result Value Ref Range Status   Specimen Description BLOOD RIGHT HAND  Final   Special Requests   Final    BOTTLES DRAWN AEROBIC AND ANAEROBIC Blood Culture adequate volume   Culture   Final    NO GROWTH < 12 HOURS Performed at [facility], [place] [address], [place],  [address]    Report Status PENDING  Incomplete    Labs: CBC: Recent Labs  Lab 04/21/22 2246  WBC 7.7  HGB 12.4  HCT 37.6  MCV 89.1  PLT [id number]   Basic Metabolic Panel: Recent Labs  Lab 04/21/22 2246 04/22/22 0817  NA 138 137  K 3.9 4.1  CL 103 107  CO2 21* 22  GLUCOSE 114* 102*  BUN 27* 24*  CREATININE 1.33*  0.98  CALCIUM 9.4 8.5*   Liver Function Tests: Recent Labs  Lab 04/21/22 2246  AST 35  ALT 35  ALKPHOS 118  BILITOT 0.5  PROT 7.4  ALBUMIN 3.4*   CBG: Recent Labs  Lab 04/22/22 0803  GLUCAP 99    Discharge time spent: greater than 30 minutes.  This record has been created  using Systems analyst. Errors have been sought and corrected,but may not always be located. Such creation errors do not reflect on the standard of care.   Signed: Lorella Nimrod, MD [facility] 04/22/2022

## 2022-04-22 NOTE — Progress Notes (Signed)
ANTICOAGULATION CONSULT NOTE - Initial Consult  Pharmacy Consult for heparin Indication: atrial fibrillation  Allergies  Allergen Reactions   Metformin And Related Diarrhea and Nausea Only   Other Other (See Comments)    OXYCODONE Pain medication after back surgery in 2014 caused delirium - pt was told not to take again and to stay awake all night after taking   Oxycontin [Oxycodone]     Delirious, headache    Patient Measurements: Height: 5' (152.4 cm) Weight: 94.8 kg (209 lb) IBW/kg (Calculated) : 45.5 Heparin Dosing Weight: 68.3 Kg  Vital Signs: Temp: 97.5 F (36.4 C) (02/17 2302) Temp Source: Oral (02/17 2302) BP: 89/51 (02/18 0430) Pulse Rate: 58 (02/18 0430)  Labs: Recent Labs    04/21/22 2246 04/22/22 0035  HGB 12.4  --   HCT 37.6  --   PLT 325  --   CREATININE 1.33*  --   TROPONINIHS 4 5    Estimated Creatinine Clearance: 46.9 mL/min (A) (by C-G formula based on SCr of 1.33 mg/dL (H)).   Medical History: Past Medical History:  Diagnosis Date   Asthma    Back pain    Bell's palsy    BMI 40.0-44.9, adult (HCC)    Chest pain    Chewing difficulty    Constipation    GERD (gastroesophageal reflux disease)    Hyperlipidemia    Hypertension    Low vitamin D level    per notes from [facility]   Obesity    per notes from [facility]   Orbital floor fracture [facility])    [facility]; per notes from [place]   Prediabetes    per notes from [place]   Preeclampsia    per notes from [place]: 62 y.o. year-old female presents to the ED with chief complaint of heart palpitations and SOB found to have new onset atrial fibrillation. No anticoagulation reported prior to admission. CBC WNL. Pharmacy consulted to start heparin infusion.   Goal of Therapy:  Heparin level 0.3-0.7 units/ml Monitor platelets by anticoagulation protocol: Yes   Plan:  Give 4000 units bolus x 1 Start heparin infusion at 1000  units/hr Check anti-Xa level in 8 hours and daily while on heparin Continue to monitor H&H and platelets  Georga Bora, PharmD Clinical Pharmacist 04/22/2022 5:27 AM Please check AMION for all [place] numbers

## 2022-04-22 NOTE — Hospital Course (Addendum)
Taken from H&P.  Linda Olson is a 62 y.o. female with medical history significant of hypertension, type 2 diabetes and obesity presented to ED with complaint of heart palpitations, shortness of breath, and nausea, started about an hour before presentation.  She drank some cola. (? Caffeine) prior to onset. No h/o thyroid problems.   Pt endorses chronic orthostatic symptoms in recent months, 60-70 lbs wt loss over past 12 months since she started the [facility].   ED course.  Patient was hypotensive at 72/52, fluid responsive.  Labs with mild AKI, creatinine at 1.33 with baseline less than 1.  TSH normal, troponin negative x 2, rest of the labs unremarkable except mildly elevated D-dimer at 0.74. First EKG in ER shows A-fib with RVR, spontaneously converted back to sinus rhythm with 1 dose of Cardizem 10 mg.  Patient has echocardiogram recently done on 04/16/2022 which was normal. Chest x-ray without any acute abnormality, CTA negative for PE, did show a mild patchy infiltrate on right base.  Patient initially started on sepsis protocol based on tachycardia, tachypnea and hypotension. Concern of pneumonia.  She received ceftriaxone and Zithromax along with IVF.  2/18: Vital stable.  Procalcitonin and lactic acid negative.  Patient does not have any upper respiratory symptoms.  No need for any antibiotics.  She might have some prior viral illness.  She just had 1 episode of A-fib which converted back to sinus with Cardizem bolus.   CHADS-VASC = 3 , discussed with patient and she was started on Xarelto for stroke prevention.  She might not need it for a long time if remained in sinus rhythm and no more episodes.  Patient was advised to discuss with her cardiologist for further management.  Recent echocardiogram couple of days ago was normal.  Respiratory panel was negative.  Patient to complain about some dizziness with ambulation, negative orthostatic vitals.  No hypoxia.  Patient will  continue with the rest of her home medications and need to have a close follow-up with her providers for further recommendations.

## 2022-04-22 NOTE — Assessment & Plan Note (Addendum)
Question of sepsis secondary to question of PNA, with hypotension. DDx includes resolved PNA with lag time on imaging study; pulmonary edema / CHF secondary to A.Fib RVR (though seems kinda unilateral for this, also just had 2d echo a couple of days ago that was unimpressive for CHF), and hypotension secondary to cardizem, though she only got a 75m bolus of this.  I'm more thinking that: A) PNA was mild and earlier this week, and imaging lagging behind clinical picture (clinical picture being one of recovery from respiratory illness already) B) Pts low blood pressure is actually more of a chronic issue and is secondary to massive weight loss over past 12 months (pt thinks maybe 60-70 lbs?) courtesy of Mounjaro.  Regardless, will treat for PNA + Sepsis at this time because pt has persistent soft BPs in ED.  At least until we can be completely convinced that she has ruled out for sepsis: Sepsis pathway Getting 3L IVF bolus in ED then 125 cc/hr Rocephin + azithro Lactate pending Procalcitonin pending Check covid, flu, RSV BCx pending

## 2022-04-22 NOTE — Assessment & Plan Note (Signed)
Sensitive SSI AC/HS for the moment

## 2022-04-22 NOTE — ED Notes (Signed)
Reesa Chew, MD notified that patient was dizzy while walking.

## 2022-04-23 ENCOUNTER — Telehealth (HOSPITAL_COMMUNITY): Payer: Self-pay

## 2022-04-23 NOTE — Telephone Encounter (Signed)
Contacted patient regarding ED follow up appointment to be seen at the A-fib clinic. She is scheduled on 3/4 @ 8:30am to see Clint Fenton-PA. Patient given directions to access our clinic. Communicated with patient and she verbalized understanding.

## 2022-04-24 ENCOUNTER — Telehealth (INDEPENDENT_AMBULATORY_CARE_PROVIDER_SITE_OTHER): Payer: Self-pay | Admitting: Family Medicine

## 2022-04-24 DIAGNOSIS — I480 Paroxysmal atrial fibrillation: Secondary | ICD-10-CM | POA: Diagnosis not present

## 2022-04-24 DIAGNOSIS — I712 Thoracic aortic aneurysm, without rupture, unspecified: Secondary | ICD-10-CM | POA: Diagnosis not present

## 2022-04-24 DIAGNOSIS — E1169 Type 2 diabetes mellitus with other specified complication: Secondary | ICD-10-CM

## 2022-04-24 DIAGNOSIS — I10 Essential (primary) hypertension: Secondary | ICD-10-CM | POA: Diagnosis not present

## 2022-04-24 DIAGNOSIS — E785 Hyperlipidemia, unspecified: Secondary | ICD-10-CM | POA: Diagnosis not present

## 2022-04-24 MED ORDER — TIRZEPATIDE 7.5 MG/0.5ML ~~LOC~~ SOAJ: 7.5000 mg | SUBCUTANEOUS | 0 refills | Status: DC

## 2022-04-24 NOTE — Telephone Encounter (Signed)
Ok to send in a lower dose until she can get back to her 12.46m. Please send it in but keep the 12.574mdose in her medlist.

## 2022-04-24 NOTE — Telephone Encounter (Signed)
Patient called 04/24/22 about her prescription for Mounjaro 12.5 the [facility] on [address] is out of stock.She spoke with pharmacy at [address] they have  the [facility] 7.5 she would like to know if she can be switch to the 7.5 Mounjaro until the 12.5 is back in stock.

## 2022-04-27 ENCOUNTER — Encounter: Payer: Self-pay | Admitting: Internal Medicine

## 2022-04-27 ENCOUNTER — Ambulatory Visit: Payer: BC Managed Care – PPO | Admitting: Internal Medicine

## 2022-04-27 VITALS — BP 115/76 | HR 60 | Ht 61.0 in | Wt 213.0 lb

## 2022-04-27 DIAGNOSIS — I77810 Thoracic aortic ectasia: Secondary | ICD-10-CM | POA: Diagnosis not present

## 2022-04-27 DIAGNOSIS — I482 Chronic atrial fibrillation, unspecified: Secondary | ICD-10-CM | POA: Diagnosis not present

## 2022-04-27 DIAGNOSIS — E782 Mixed hyperlipidemia: Secondary | ICD-10-CM

## 2022-04-27 DIAGNOSIS — I10 Essential (primary) hypertension: Secondary | ICD-10-CM

## 2022-04-27 LAB — CULTURE, BLOOD (ROUTINE X 2)
Culture: NO GROWTH
Culture: NO GROWTH
Special Requests: ADEQUATE

## 2022-04-27 NOTE — Progress Notes (Signed)
Primary Physician/Referring:  [name], MD  Patient ID: Linda Olson, female    DOB: 08/25/1960, 63 y.o.   MRN: [id number]  Chief Complaint  Patient presents with   Atrial Fibrillation   New Patient (Initial Visit)   HPI:    Linda Olson  is a 62 y.o. female with past medical history significant for hypertension, hyperlipidemia, and new onset paroxysmal atrial fibrillation who is here to establish care with cardiology.  She was admitted to the hospital from February 17 to 18 due to palpitations where she was found to be in A-fib with RVR.  She converted back to normal sinus rhythm after a Cardizem bolus and she was started on Xarelto.  She had an echocardiogram in the hospital which showed normal ejection fraction and no valvular disease.  Her A-fib is controlled and she is doing well on her current medication regiment.  Patient denies chest pain, shortness of breath, diaphoresis, syncope, edema, orthopnea, PND.  Past Medical History:  Diagnosis Date   Asthma    Back pain    Bell's palsy    BMI 40.0-44.9, adult (HCC)    Chest pain    Chewing difficulty    Constipation    GERD (gastroesophageal reflux disease)    Hyperlipidemia    Hypertension    Low vitamin D level    per notes from [place]   Obesity    per notes from [place]   Orbital floor fracture [facility])    [facility]; per notes from [place]   Prediabetes    per notes from [place]   Preeclampsia    per notes from [place]   Past Surgical History:  Procedure Laterality Date   BACK SURGERY     COLONOSCOPY     per notes from [place] ARTHROSCOPY     VAGINAL HYSTERECTOMY     Family History  Problem Relation Age of Onset   Hypertension Mother    Hypertension Father    Hyperlipidemia Father    High blood pressure Sister    Stroke Maternal Aunt    Breast cancer Neg Hx     Social History   Tobacco Use   Smoking status: Never   Smokeless  tobacco: Never  Substance Use Topics   Alcohol use: No   Marital Status: Single  ROS  Review of Systems  Cardiovascular:  Positive for palpitations.   Objective  Blood pressure 115/76, pulse 60, height '5\' 1"'$  (1.549 m), weight 213 lb (96.6 kg), SpO2 98 %. Body mass index is 40.25 kg/m.     04/27/2022    8:31 AM 04/22/2022    3:21 PM 04/22/2022    2:03 PM  Vitals with BMI  Height '5\' 1"'$     Weight 213 lbs    BMI [id number]    Systolic [id number]  Diastolic 76 78 93  Pulse 60 82 87     Physical Exam Vitals reviewed.  HENT:     Head: Normocephalic and atraumatic.  Cardiovascular:     Rate and Rhythm: Normal rate and regular rhythm.     Pulses: Normal pulses.     Heart sounds: Normal heart sounds. No murmur heard. Pulmonary:     Effort: Pulmonary effort is normal.     Breath sounds: Normal breath sounds.  Abdominal:     General: Bowel sounds are normal.  Musculoskeletal:     Right lower leg: No edema.     Left  lower leg: No edema.  Skin:    General: Skin is warm and dry.  Neurological:     Mental Status: She is alert.     Medications and allergies   Allergies  Allergen Reactions   Metformin And Related Diarrhea and Nausea Only   Other Other (See Comments)    OXYCODONE Pain medication after back surgery in 2014 caused delirium - pt was told not to take again and to stay awake all night after taking   Oxycontin [Oxycodone]     Delirious, headache     Medication list after today's encounter   Current Outpatient Medications:    atorvastatin (LIPITOR) 40 MG tablet, Take 1 tablet (40 mg total) by mouth daily., Disp: 30 tablet, Rfl: 0   losartan-hydrochlorothiazide (HYZAAR) 50-12.5 MG tablet, Take 1 tablet by mouth daily., Disp: , Rfl:    methocarbamol (ROBAXIN) 500 MG tablet, Take 1 tablet (500 mg total) by mouth every 8 (eight) hours as needed for muscle spasms. (Patient taking differently: Take 500 mg by mouth daily as needed for muscle spasms.), Disp: 40 tablet, Rfl:  0   metoprolol tartrate (LOPRESSOR) 25 MG tablet, Take 25 mg by mouth 2 (two) times daily. Pill in pocket, Disp: , Rfl:    rivaroxaban (XARELTO) 20 MG TABS tablet, Take 1 tablet (20 mg total) by mouth daily with supper., Disp: 30 tablet, Rfl: 1   tirzepatide (MOUNJARO) 12.5 MG/0.5ML Pen, Inject 1 pen (12.5 mg) into the skin once a week., Disp: 2 mL, Rfl: 0   tirzepatide (MOUNJARO) 7.5 MG/0.5ML Pen, Inject 7.5 mg into the skin once a week. (Patient not taking: Reported on 04/27/2022), Disp: 2 mL, Rfl: 0  Laboratory examination:   Lab Results  Component Value Date   NA 137 04/22/2022   K 4.1 04/22/2022   CO2 22 04/22/2022   GLUCOSE 102 (H) 04/22/2022   BUN 24 (H) 04/22/2022   CREATININE 0.98 04/22/2022   CALCIUM 8.5 (L) 04/22/2022   EGFR 77 12/20/2021   GFRNONAA >60 04/22/2022       Latest Ref Rng & Units 04/22/2022    8:17 AM 04/21/2022   10:46 PM 12/20/2021   10:52 AM  CMP  Glucose 70 - 99 mg/dL 102  114  85   BUN 6 - 20 mg/dL '24  27  21   '$ Creatinine 0.44 - 1.00 mg/dL 0.98  1.33  0.87   Sodium 135 - 145 mmol/L 137  138  140   Potassium 3.5 - 5.1 mmol/L 4.1  3.9  4.6   Chloride 98 - 111 mmol/L 107  103  103   CO2 22 - 32 mmol/L '22  21  24   '$ Calcium 8.9 - 10.3 mg/dL 8.5  9.4  9.5   Total Protein 6.5 - 8.1 g/dL  7.4  7.1   Total Bilirubin 0.3 - 1.2 mg/dL  0.5  0.4   Alkaline Phos 38 - 126 U/L  118  124   AST 15 - 41 U/L  35  33   ALT 0 - 44 U/L  35  40       Latest Ref Rng & Units 04/21/2022   10:46 PM 09/04/2021    9:00 AM 03/30/2021    7:55 AM  CBC  WBC 4.0 - 10.5 K/uL 7.7  7.0  5.3   Hemoglobin 12.0 - 15.0 g/dL 12.4  12.3  12.4   Hematocrit 36.0 - 46.0 % 37.6  39.4  37.1   Platelets 150 - 400  K/uL 325  308  366     Lipid Panel Recent Labs    12/20/21 1052  CHOL 235*  TRIG 77  LDLCALC 165*  HDL 57    HEMOGLOBIN A1C Lab Results  Component Value Date   HGBA1C 6.0 (H) 12/20/2021   MPG 131.24 03/19/2019   TSH Recent Labs    04/21/22 2246  TSH 2.094     External labs:     Radiology:    Cardiac Studies:   04/16/2022 ECHO IMPRESSIONS   1. Left ventricular ejection fraction, by estimation, is 60 to 65%. The  left ventricle has normal function. The left ventricle has no regional  wall motion abnormalities. Left ventricular diastolic parameters were  normal.   2. Right ventricular systolic function is normal. The right ventricular  size is normal.   3. No evidence of mitral valve regurgitation.   4. The aortic valve is tricuspid. There is mild calcification of the  aortic valve. Aortic valve regurgitation is not visualized.   5. Aneurysm of the ascending aorta, measuring 42 mm. Repeat echo in 1 year  6. The inferior vena cava is normal in size with greater than 50%  respiratory variability, suggesting right atrial pressure of 3 mmHg.      EKG:   04/27/2022: Sinus Rhythm, rate 60 bpm. Normal axis, no ischemia.  Assessment     ICD-10-CM   1. Atrial fibrillation, paroxysmal  I48.20 EKG 12-Lead    2. Essential hypertension  I10     3. Mixed hyperlipidemia  E78.2     4. Ascending aorta dilatation (HCC)  I77.810        Orders Placed This Encounter  Procedures   EKG 12-Lead    No orders of the defined types were placed in this encounter.   There are no discontinued medications.   Recommendations:   Linda Olson is a 62 y.o.  female with PAF  CHA2DS2VASc = 3  Atrial fibrillation, paroxysmal Continue current medication Xarelto samples provided for patient, no evidence of bleeding EKG shows normal sinus rhythm today   Essential hypertension Continue current cardiac medications. Encourage low-sodium diet, less than 2000 mg daily. Follow-up in 6 months or sooner if needed.   Mixed hyperlipidemia Continue statin, LDL uncontrolled in 12/2021   Ascending aorta dilatation [facility]) Discussed and reveiwed echocardiogram with patient Ascending aorta measures 42 mm, will repeat echo in 1  year     Floydene Flock, DO, [facility]  04/27/2022, 9:47 AM Office: [phone] Pager: [phone]

## 2022-05-01 NOTE — Progress Notes (Unsigned)
Chief Complaint:   OBESITY Gertrue is here to discuss her progress with her obesity treatment plan along with follow-up of her obesity related diagnoses. Linda Olson is on the Category 2 Plan or keeping a food journal and adhering to recommended goals of 1200 calories and 80+ grams of protein and states she is following her eating plan approximately 0% of the time. Bernina states she is doing 0 minutes 0 times per week.  Today's visit was #: 24 Starting weight: 237 lbs Starting date: 04/28/2020 Today's weight: 209 lbs Today's date: 04/17/2022 Total lbs lost to date: 28 Total lbs lost since last in-office visit: 1  Interim History: Delain has been able to lose some weight, but she has deviated from her plan more. She is ready to get back on track.   Subjective:   1. Essential hypertension Rekita's blood pressure is elevated today. She is changing her medications to PM, and she didn't take it yet today.   2. Pure hypercholesterolemia Damari's cholesterol is not yet controlled. She is on Lipitor and she is working on her weight loss.   3. Type 2 diabetes mellitus with other specified complication, without long-term current use of insulin ([place]) Iyana on [place], but she is struggling to follow her plan as well.   4. Thoracic aortic aneurysm without rupture, unspecified part ([place]) Jennice had an echo done recently and she asked me to review the results. It is mostly within normal limits, but she has a 42 mm ascending aortic aneurysm.   Assessment/Plan:   1. Essential hypertension Brittish will change her medications to PM, and we will recheck her blood pressure in 1 month.   2. Pure hypercholesterolemia We will refill Lipitor for 1 month. Pabla will work on decreasing cholesterol in her diet.   - atorvastatin (LIPITOR) 40 MG tablet; Take 1 tablet (40 mg total) by mouth daily.  Dispense: 30 tablet; Refill: 0  3. Type 2 diabetes mellitus with other  specified complication, without long-term current use of insulin ([place]) Wynelle will continue Mounjaro, and we will refill for 1 month.   - tirzepatide (MOUNJARO) 12.5 MG/0.5ML Pen; Inject 1 pen (12.5 mg) into the skin once a week.  Dispense: 2 mL; Refill: 0  4. Thoracic aortic aneurysm without rupture, unspecified part ([place]) Yasaman was educated on what this is and the importance of controlling her blood pressure and weight loss. She needs to discuss this with Cardiology.   5. BMI 40.0-44.9, adult (HCC)  6. Obesity, Beginning BMI 46.29 Charnel is currently in the action stage of change. As such, her goal is to continue with weight loss efforts. She has agreed to strictly follow the Category 2 Plan.   Behavioral modification strategies: increasing lean protein intake, decreasing sodium intake, planning for success, and decreasing junk food.  Makhari has agreed to follow-up with our clinic in 4 weeks. She was informed of the importance of frequent follow-up visits to maximize her success with intensive lifestyle modifications for her multiple health conditions.   Objective:   Blood pressure (!) 154/83, pulse 68, temperature 98.1 F (36.7 C), height 5' (1.524 m), weight 209 lb (94.8 kg), SpO2 99 %. Body mass index is 40.82 kg/m.  General: Cooperative, alert, well developed, in no acute distress. HEENT: Conjunctivae and lids unremarkable. Cardiovascular: Regular rhythm.  Lungs: Normal work of breathing. Neurologic: No focal deficits.   Lab Results  Component Value Date   CREATININE 0.98 04/22/2022   BUN 24 (H) 04/22/2022   NA 137  04/22/2022   K 4.1 04/22/2022   CL 107 04/22/2022   CO2 22 04/22/2022   Lab Results  Component Value Date   ALT 35 04/21/2022   AST 35 04/21/2022   ALKPHOS 118 04/21/2022   BILITOT 0.5 04/21/2022   Lab Results  Component Value Date   HGBA1C 6.0 (H) 12/20/2021   HGBA1C 6.2 (H) 03/30/2021   HGBA1C 6.0 (H) 08/09/2020   HGBA1C 8.2 (H)  04/29/2020   HGBA1C 6.2 (H) 03/19/2019   Lab Results  Component Value Date   INSULIN 19.5 12/20/2021   INSULIN 16.0 08/09/2020   INSULIN 22.1 04/29/2020   Lab Results  Component Value Date   TSH 2.094 04/21/2022   Lab Results  Component Value Date   CHOL 235 (H) 12/20/2021   HDL 57 12/20/2021   LDLCALC 165 (H) 12/20/2021   TRIG 77 12/20/2021   CHOLHDL 4.3 03/30/2021   Lab Results  Component Value Date   VD25OH 28.8 (L) 12/20/2021   VD25OH 40.3 03/30/2021   VD25OH 65.9 08/09/2020   Lab Results  Component Value Date   WBC 7.7 04/21/2022   HGB 12.4 04/21/2022   HCT 37.6 04/21/2022   MCV 89.1 04/21/2022   PLT 325 04/21/2022   Lab Results  Component Value Date   IRON 53 03/30/2021   TIBC 298 03/30/2021   FERRITIN 157 (H) 03/30/2021   Attestation Statements:   Reviewed by clinician on day of visit: allergies, medications, problem list, medical history, surgical history, family history, social history, and previous encounter notes.  Time spent on visit including pre-visit chart review and post-visit care and charting was 40 minutes.   I, Trixie Dredge, am acting as transcriptionist for Dennard Nip, MD.  I have reviewed the above documentation for accuracy and completeness, and I agree with the above. -  ***

## 2022-05-03 NOTE — Telephone Encounter (Signed)
Error

## 2022-05-07 ENCOUNTER — Ambulatory Visit (HOSPITAL_COMMUNITY)
Admission: RE | Admit: 2022-05-07 | Discharge: 2022-05-07 | Disposition: A | Discharge status: Home / Self Care | Payer: BC Managed Care – PPO | Source: Ambulatory Visit | Attending: Physician Assistant | Admitting: Physician Assistant

## 2022-05-07 ENCOUNTER — Other Ambulatory Visit (INDEPENDENT_AMBULATORY_CARE_PROVIDER_SITE_OTHER): Payer: Self-pay | Admitting: Family Medicine

## 2022-05-07 VITALS — BP 136/76 | HR 52 | Ht 61.0 in | Wt 212.4 lb

## 2022-05-07 DIAGNOSIS — R7303 Prediabetes: Secondary | ICD-10-CM | POA: Diagnosis not present

## 2022-05-07 DIAGNOSIS — Z7901 Long term (current) use of anticoagulants: Secondary | ICD-10-CM | POA: Diagnosis not present

## 2022-05-07 DIAGNOSIS — I480 Paroxysmal atrial fibrillation: Secondary | ICD-10-CM | POA: Insufficient documentation

## 2022-05-07 DIAGNOSIS — E669 Obesity, unspecified: Secondary | ICD-10-CM | POA: Diagnosis not present

## 2022-05-07 DIAGNOSIS — E785 Hyperlipidemia, unspecified: Secondary | ICD-10-CM | POA: Insufficient documentation

## 2022-05-07 DIAGNOSIS — Z6841 Body Mass Index (BMI) 40.0 and over, adult: Secondary | ICD-10-CM | POA: Diagnosis not present

## 2022-05-07 DIAGNOSIS — Z79899 Other long term (current) drug therapy: Secondary | ICD-10-CM | POA: Diagnosis not present

## 2022-05-07 DIAGNOSIS — D6869 Other thrombophilia: Secondary | ICD-10-CM | POA: Insufficient documentation

## 2022-05-07 DIAGNOSIS — I10 Essential (primary) hypertension: Secondary | ICD-10-CM | POA: Insufficient documentation

## 2022-05-07 DIAGNOSIS — E1169 Type 2 diabetes mellitus with other specified complication: Secondary | ICD-10-CM

## 2022-05-07 NOTE — Progress Notes (Signed)
Primary Care Physician: [name], MD Primary Cardiologist: Dr Shellia Carwin Primary Electrophysiologist: none Referring Physician: [facility]   Linda Olson is a 62 y.o. female with a history of HTN, DM, HLD, atrial fibrillation who presents for consultation in the [facility]. She was admitted to the hospital from February 17 to 18 due to palpitations where she was found to be in A-fib with RVR. She converted back to normal sinus rhythm after a Cardizem bolus. She had an echocardiogram in the hospital which showed normal ejection fraction and no valvular disease. Patient is on Xarelto for a CHADS2VASC score of 3.  Episode of A-fib that led to ED visit included the following symptoms: rushing sensation in chest, vision change, heavy breathing, feeling faint, and weakness. She has not had any of these symptoms since hospital admission.  Today, she is doing well overall since hospital admission. She has remained compliant with her Xarelto daily. She states she has not taken metoprolol prescription at all. No bleeding issues with medication.  Today, she denies symptoms of palpitations, chest pain, shortness of breath, orthopnea, PND, lower extremity edema, dizziness, presyncope, syncope, snoring, daytime somnolence, bleeding, or neurologic sequela. The patient is tolerating medications without difficulties and is otherwise without complaint today.    Atrial Fibrillation Risk Factors:  she does not have a history of alcohol use. The patient does not have a history of early familial atrial fibrillation or other arrhythmias.  she has a BMI of Body mass index is 40.13 kg/m.Marland Kitchen Filed Weights   05/07/22 0825  Weight: 96.3 kg    Family History  Problem Relation Age of Onset   Hypertension Mother    Hypertension Father    Hyperlipidemia Father    High blood pressure Sister    Stroke Maternal Aunt    Breast cancer Neg Hx      Atrial Fibrillation  Management history:  Previous antiarrhythmic drugs: none Previous cardioversions: none Previous ablations: none CHADS2VASC score: 3 Anticoagulation history: Xarelto   Past Medical History:  Diagnosis Date   Asthma    Back pain    Bell's palsy    BMI 40.0-44.9, adult (HCC)    Chest pain    Chewing difficulty    Constipation    GERD (gastroesophageal reflux disease)    Hyperlipidemia    Hypertension    Low vitamin D level    per notes from [place]   Obesity    per notes from [place]   Orbital floor fracture [facility])    [facility]; per notes from [place]   Prediabetes    per notes from [place]   Preeclampsia    per notes from [place]   Past Surgical History:  Procedure Laterality Date   BACK SURGERY     COLONOSCOPY     per notes from [facility]   KNEE ARTHROSCOPY     VAGINAL HYSTERECTOMY      Current Outpatient Medications  Medication Sig Dispense Refill   APPLE CIDER VINEGAR PO Take 1 tablet by mouth every morning.     atorvastatin (LIPITOR) 40 MG tablet Take 1 tablet (40 mg total) by mouth daily. 30 tablet 0   losartan-hydrochlorothiazide (HYZAAR) 50-12.5 MG tablet Take 1 tablet by mouth daily.     methocarbamol (ROBAXIN) 500 MG tablet Take 1 tablet (500 mg total) by mouth every 8 (eight) hours as needed for muscle spasms. (Patient taking differently: Take 500 mg by mouth daily as  needed for muscle spasms.) 40 tablet 0   metoprolol tartrate (LOPRESSOR) 25 MG tablet Take 25 mg by mouth 2 (two) times daily. Pill in pocket     Misc Natural Products (YUMVS BEET ROOT-TART CHERRY PO) Take 1 tablet by mouth every morning.     rivaroxaban (XARELTO) 20 MG TABS tablet Take 1 tablet (20 mg total) by mouth daily with supper. 30 tablet 1   tirzepatide (MOUNJARO) 7.5 MG/0.5ML Pen Inject 7.5 mg into the skin once a week. 2 mL 0   VITAMIN D, CHOLECALCIFEROL, PO Take 2,000 mg by mouth every morning.     tirzepatide (MOUNJARO) 12.5  MG/0.5ML Pen Inject 1 pen (12.5 mg) into the skin once a week. (Patient not taking: Reported on 05/07/2022) 2 mL 0   No current facility-administered medications for this encounter.    Allergies  Allergen Reactions   Metformin And Related Diarrhea and Nausea Only   Other Other (See Comments)    OXYCODONE Pain medication after back surgery in 2014 caused delirium - pt was told not to take again and to stay awake all night after taking   Oxycontin [Oxycodone]     Delirious, headache    Social History   Socioeconomic History   Marital status: Single    Spouse name: Not on file   Number of children: 1   Years of education: Not on file   Highest education level: Not on file  Occupational History   Not on file  Tobacco Use   Smoking status: Never   Smokeless tobacco: Never  Vaping Use   Vaping Use: Never used  Substance and Sexual Activity   Alcohol use: No   Drug use: No   Sexual activity: Not on file  Other Topics Concern   Not on file  Social History Narrative   Lives at home with sister   Caffeine max of 1 cup in a day but not daily    Social Determinants of Health   Financial Resource Strain: Not on file  Food Insecurity: Not on file  Transportation Needs: Not on file  Physical Activity: Not on file  Stress: Not on file  Social Connections: Not on file  Intimate Partner Violence: Not on file     ROS- All systems are reviewed and negative except as per the HPI above.  Physical Exam: Vitals:   05/07/22 0825  BP: 136/76  Pulse: (!) 52  Weight: 96.3 kg  Height: '5\' 1"'$  (1.549 m)    GEN- The patient is a well appearing female, alert and oriented x 3 today.   Head- normocephalic, atraumatic Eyes-  Sclera clear, conjunctiva pink Ears- hearing intact Oropharynx- clear Neck- supple  Lungs- Clear to ausculation bilaterally, normal work of breathing Heart- Regular rate and rhythm, no murmurs, rubs or gallops  GI- soft, NT, ND, + BS Extremities- no clubbing,  cyanosis, or edema MS- no significant deformity or atrophy Skin- no rash or lesion Psych- euthymic mood, full affect Neuro- strength and sensation are intact  Wt Readings from Last 3 Encounters:  05/07/22 96.3 kg  04/27/22 96.6 kg  04/22/22 94.8 kg    EKG today demonstrates  Sinus bradycardia HR 52 QT 420 ms Qtc 390 ms  Echo 04/16/22 demonstrated   1. Left ventricular ejection fraction, by estimation, is 60 to 65%. The  left ventricle has normal function. The left ventricle has no regional  wall motion abnormalities. Left ventricular diastolic parameters were  normal.   2. Right ventricular systolic function is  normal. The right ventricular  size is normal.   3. No evidence of mitral valve regurgitation.   4. The aortic valve is tricuspid. There is mild calcification of the  aortic valve. Aortic valve regurgitation is not visualized.   5. Aneurysm of the ascending aorta, measuring 42 mm.   6. The inferior vena cava is normal in size with greater than 50%  respiratory variability, suggesting right atrial pressure of 3 mmHg.   Comparison(s): No significant change from prior study.   Conclusion(s)/Recommendation(s): Normal biventricular function without  evidence of hemodynamically significant valvular heart disease.    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 Paroxysmal Atrial Fibrillation (ICD10:  I48.0) The patient's CHA2DS2-VASc score is 3, indicating a 3.2% annual risk of stroke.   General education about afib provided and questions answered. We also discussed her stroke risk and the risks and benefits of anticoagulation.  She is in SR today.  Continue Lopressor 25 mg BID PRN Continue Xarelto 20 mg daily  2. Secondary  Hypercoagulable State (ICD10:  D68.69) The patient is at significant risk for stroke/thromboembolism based upon her CHA2DS2-VASc Score of 3.  Continue Rivaroxaban (Xarelto).   3. Obesity Body mass index is 40.13 kg/m. Lifestyle modification was discussed at length including regular exercise and weight reduction. She has lost significant weight on Mounjaro.  4. HTN Stable, no changes today. Continue current medication regimen.   Follow up with Dr Shellia Carwin as scheduled. AF clinic as needed.    [facility] [address], [place] [address] [phone] 05/07/2022 8:48 AM

## 2022-05-08 DIAGNOSIS — H40023 Open angle with borderline findings, high risk, bilateral: Secondary | ICD-10-CM | POA: Diagnosis not present

## 2022-05-15 ENCOUNTER — Other Ambulatory Visit (HOSPITAL_COMMUNITY): Payer: Self-pay

## 2022-05-15 ENCOUNTER — Ambulatory Visit (INDEPENDENT_AMBULATORY_CARE_PROVIDER_SITE_OTHER): Payer: BC Managed Care – PPO | Admitting: Physician Assistant

## 2022-05-15 ENCOUNTER — Encounter (INDEPENDENT_AMBULATORY_CARE_PROVIDER_SITE_OTHER): Payer: Self-pay | Admitting: Physician Assistant

## 2022-05-15 VITALS — BP 154/81 | HR 55 | Temp 97.9°F | Ht 61.0 in | Wt 209.0 lb

## 2022-05-15 DIAGNOSIS — I480 Paroxysmal atrial fibrillation: Secondary | ICD-10-CM

## 2022-05-15 DIAGNOSIS — E1169 Type 2 diabetes mellitus with other specified complication: Secondary | ICD-10-CM

## 2022-05-15 DIAGNOSIS — Z7985 Long-term (current) use of injectable non-insulin antidiabetic drugs: Secondary | ICD-10-CM

## 2022-05-15 DIAGNOSIS — Z6841 Body Mass Index (BMI) 40.0 and over, adult: Secondary | ICD-10-CM

## 2022-05-15 DIAGNOSIS — I712 Thoracic aortic aneurysm, without rupture, unspecified: Secondary | ICD-10-CM | POA: Diagnosis not present

## 2022-05-15 DIAGNOSIS — E669 Obesity, unspecified: Secondary | ICD-10-CM

## 2022-05-15 MED ORDER — TIRZEPATIDE 10 MG/0.5ML ~~LOC~~ SOAJ
10.0000 mg | SUBCUTANEOUS | 0 refills | Status: DC
  Filled 2022-05-15: qty 2, 28d supply, fill #0

## 2022-05-15 NOTE — Assessment & Plan Note (Signed)
Type 2 Diabetes Mellitus with other specified complication, without long-term current use of insulin HgbA1c is at goal. Last A1c was 6.0 CBGs: Not checking as ran out of test strips and working to get more strips Episodes of hypoglycemia: no Medication(s):  Mounjaro 7.5 mg SQ weekly  Lab Results  Component Value Date   HGBA1C 6.0 (H) 12/20/2021   HGBA1C 6.2 (H) 03/30/2021   HGBA1C 6.0 (H) 08/09/2020   Lab Results  Component Value Date   LDLCALC 165 (H) 12/20/2021   CREATININE 0.98 04/22/2022   No results found for: "GFR"  Plan: Continue and increase dose Mounjaro 10 mg SQ weekly  Continue working on nutrition plan to decrease simple carbohydrates, increase lean proteins and exercise to promote weight loss, and improve glycemic control.

## 2022-05-15 NOTE — Assessment & Plan Note (Signed)
Start BMI 46.29  04/28/2020

## 2022-05-15 NOTE — Assessment & Plan Note (Signed)
Current BMI 40.8

## 2022-05-15 NOTE — Assessment & Plan Note (Addendum)
Ascending AA 42 mm will need FU Echo 1 yr and patient following up regularly with Cardiology.   She is working on Camera operator to promote weight loss and good BP control. Will follow with cardiology.

## 2022-05-15 NOTE — Assessment & Plan Note (Addendum)
Hospitalized 2/17-2/18/24 due to new onset A fib and converted to SR on Cardizem drip. She has remained in SR since hospitalization. Continues Xarelto for anticoagulation. Following up with cardiology regularly.  Has not taken metoprolol as had no recurrent episodes.    2D echo showed:  Echo 04/16/22 demonstrated   1. Left ventricular ejection fraction, by estimation, is 60 to 65%. The  left ventricle has normal function. The left ventricle has no regional  wall motion abnormalities. Left ventricular diastolic parameters were  normal.   2. Right ventricular systolic function is normal. The right ventricular  size is normal.   3. No evidence of mitral valve regurgitation.   4. The aortic valve is tricuspid. There is mild calcification of the  aortic valve. Aortic valve regurgitation is not visualized.   5. Aneurysm of the ascending aorta, measuring 42 mm.   6. The inferior vena cava is normal in size with greater than 50%  respiratory variability, suggesting right atrial pressure of 3 mmHg.    Ascending AA 42 mm will need FU Echo 1 yr

## 2022-05-15 NOTE — Progress Notes (Signed)
Office: [phone]  /  Fax: [phone]  WEIGHT SUMMARY AND BIOMETRICS  Vitals Temp: 97.9 F (36.6 C) BP: (!) 154/81 Pulse Rate: (!) 55 SpO2: 100 %   Anthropometric Measurements Height: '5\' 1"'$  (1.549 m) Weight: 209 lb (94.8 kg) BMI (Calculated): 39.51 Weight at Last Visit: 209 lb Weight Lost Since Last Visit: 0 lb Starting Weight: 237 lb Total Weight Loss (lbs): 28 lb (12.7 kg)   Body Composition  Body Fat %: 47.6 % Fat Mass (lbs): 99.4 lbs Muscle Mass (lbs): 104 lbs Total Body Water (lbs): 79.6 lbs Visceral Fat Rating : 15   Other Clinical Data Fasting: Yes Labs: no Today's Visit #: 25 Starting Date: 04/28/20     HPI  Chief Complaint: OBESITY  Linda Olson is here to discuss her progress with her obesity treatment plan. She is on the the Category 2 Plan and states she is following her eating plan approximately 70 % of the time. She states she is exercising step-ups 30 minutes 3 times per week.   Interval History:  Since last office visit she has undergone evaluation and treatment for A fib.  She was briefly hospitalized 2/17-2/18/24 for A fib and converted to SR on a cardizem drip. She has not had any further episodes and is fully anticoagulated on Xarelto.   She reports she has been doing well since that time.  Hunger and appetite not as well controlled on lower dose of Mounjaro ( on decreased dose due to drug shortage)  Stress- better as heart concerns addressed    Pharmacotherapy: On Mounjaro 7.5 mg more recently as higher doses had not been available. No side effects on Mounjaro 7.5 mg but hunger and appetite not as well controlled.    PHYSICAL EXAM:  Blood pressure (!) 154/81, pulse (!) 55, temperature 97.9 F (36.6 C), height '5\' 1"'$  (1.549 m), weight 209 lb (94.8 kg), SpO2 100 %. Body mass index is 39.49 kg/m.  General: She is overweight, cooperative, alert, well developed, and in no acute distress. PSYCH: Has normal mood, affect and thought  process.   Lungs: Normal breathing effort, no conversational dyspnea.  DIAGNOSTIC DATA REVIEWED:  BMET    Component Value Date/Time   NA 137 04/22/2022 0817   NA 140 12/20/2021 1052   K 4.1 04/22/2022 0817   CL 107 04/22/2022 0817   CO2 22 04/22/2022 0817   GLUCOSE 102 (H) 04/22/2022 0817   BUN 24 (H) 04/22/2022 0817   BUN 21 12/20/2021 1052   CREATININE 0.98 04/22/2022 0817   CALCIUM 8.5 (L) 04/22/2022 0817   GFRNONAA >60 04/22/2022 0817   GFRAA 101 04/29/2020 0813   Lab Results  Component Value Date   HGBA1C 6.0 (H) 12/20/2021   HGBA1C 6.2 (H) 03/19/2019   Lab Results  Component Value Date   INSULIN 19.5 12/20/2021   INSULIN 22.1 04/29/2020   Lab Results  Component Value Date   TSH 2.094 04/21/2022   CBC    Component Value Date/Time   WBC 7.7 04/21/2022 2246   RBC 4.22 04/21/2022 2246   HGB 12.4 04/21/2022 2246   HGB 12.4 03/30/2021 0755   HCT 37.6 04/21/2022 2246   HCT 37.1 03/30/2021 0755   PLT 325 04/21/2022 2246   PLT 366 03/30/2021 0755   MCV 89.1 04/21/2022 2246   MCV 87 03/30/2021 0755   MCH 29.4 04/21/2022 2246   MCHC 33.0 04/21/2022 2246   RDW 13.3 04/21/2022 2246   RDW 12.7 03/30/2021 0755   Iron Studies  Component Value Date/Time   IRON 53 03/30/2021 0755   TIBC 298 03/30/2021 0755   FERRITIN 157 (H) 03/30/2021 0755   IRONPCTSAT 18 03/30/2021 0755   Lipid Panel     Component Value Date/Time   CHOL 235 (H) 12/20/2021 1052   TRIG 77 12/20/2021 1052   HDL 57 12/20/2021 1052   CHOLHDL 4.3 03/30/2021 0755   CHOLHDL 3.5 03/19/2019 0505   VLDL 13 03/19/2019 0505   LDLCALC 165 (H) 12/20/2021 1052   Hepatic Function Panel     Component Value Date/Time   PROT 7.4 04/21/2022 2246   PROT 7.1 12/20/2021 1052   ALBUMIN 3.4 (L) 04/21/2022 2246   ALBUMIN 4.0 12/20/2021 1052   AST 35 04/21/2022 2246   ALT 35 04/21/2022 2246   ALKPHOS 118 04/21/2022 2246   BILITOT 0.5 04/21/2022 2246   BILITOT 0.4 12/20/2021 1052   BILIDIR <0.1  04/21/2022 2246   IBILI NOT CALCULATED 04/21/2022 2246      Component Value Date/Time   TSH 2.094 04/21/2022 2246   TSH 1.380 03/30/2021 0755   Nutritional Lab Results  Component Value Date   VD25OH 28.8 (L) 12/20/2021   VD25OH 40.3 03/30/2021   VD25OH 65.9 08/09/2020    ASSOCIATED CONDITIONS ADDRESSED TODAY  ASSESSMENT AND PLAN  Problem List Items Addressed This Visit     BMI 40.0-44.9, adult ([place])    Current BMI 40.8      Relevant Medications   tirzepatide (MOUNJARO) 10 MG/0.5ML Pen   Thoracic aortic aneurysm without rupture (HCC)    Ascending AA 42 mm will need FU Echo 1 yr and patient following up regularly with Cardiology.   She is working on Camera operator to promote weight loss and good BP control. Will follow with cardiology.       Obesity, Beginning BMI 46.29    Start BMI 46.29  04/28/2020      Relevant Medications   tirzepatide [facility]) 10 MG/0.5ML Pen   PAF (paroxysmal atrial fibrillation) ([place])     Hospitalized 2/17-2/18/24 due to new onset A fib and converted to SR on Cardizem drip. She has remained in SR since hospitalization. Continues Xarelto for anticoagulation. Following up with cardiology regularly.  Has not taken metoprolol as had no recurrent episodes.    2D echo showed:  Echo 04/16/22 demonstrated   1. Left ventricular ejection fraction, by estimation, is 60 to 65%. The  left ventricle has normal function. The left ventricle has no regional  wall motion abnormalities. Left ventricular diastolic parameters were  normal.   2. Right ventricular systolic function is normal. The right ventricular  size is normal.   3. No evidence of mitral valve regurgitation.   4. The aortic valve is tricuspid. There is mild calcification of the  aortic valve. Aortic valve regurgitation is not visualized.   5. Aneurysm of the ascending aorta, measuring 42 mm.   6. The inferior vena cava is normal in size with greater than 50%  respiratory variability,  suggesting right atrial pressure of 3 mmHg.    Ascending AA 42 mm will need FU Echo 1 yr      Diabetes mellitus ([place]) - Primary (Chronic)    Type 2 Diabetes Mellitus with other specified complication, without long-term current use of insulin HgbA1c is at goal. Last A1c was 6.0 CBGs: Not checking as ran out of test strips and working to get more strips Episodes of hypoglycemia: no Medication(s):  Mounjaro 7.5 mg SQ weekly  Lab Results  Component Value  Date   HGBA1C 6.0 (H) 12/20/2021   HGBA1C 6.2 (H) 03/30/2021   HGBA1C 6.0 (H) 08/09/2020   Lab Results  Component Value Date   LDLCALC 165 (H) 12/20/2021   CREATININE 0.98 04/22/2022  No results found for: "GFR"  Plan: Continue and increase dose Mounjaro 10 mg SQ weekly  Continue working on nutrition plan to decrease simple carbohydrates, increase lean proteins and exercise to promote weight loss, and improve glycemic control.        Relevant Medications   tirzepatide (MOUNJARO) 10 MG/0.5ML Pen      TREATMENT PLAN FOR OBESITY:  Recommended Dietary Goals  Gwenivere is currently in the action stage of change. As such, her goal is to continue weight management plan. She has agreed to the Category 2 Plan and keeping a food journal and adhering to recommended goals of 1200 calories and 80+ grams of protein.  Behavioral Intervention  We discussed the following Behavioral Modification Strategies today: increasing lean protein intake, decreasing simple carbohydrates , increasing vegetables, increasing water intake, and work on meal planning and easy cooking plans.  Additional resources provided today: NA  Recommended Physical Activity Goals  Hisayo has been advised to work up to 150 minutes of moderate intensity aerobic activity a week and strengthening exercises 2-3 times per week for cardiovascular health, weight loss maintenance and preservation of muscle mass.   She has agreed to increase physical activity in their  day and reduce sedentary time (increase NEAT).    Pharmacotherapy We discussed various medication options to help Emelynn with her weight loss efforts and we both agreed to increase Mounjaro to 10 mg weekly for primary indication of Type 2 diabetes.    Return in about 3 weeks (around 06/05/2022).Marland Kitchen She was informed of the importance of frequent follow up visits to maximize her success with intensive lifestyle modifications for her multiple health conditions.   ATTESTASTION STATEMENTS:  Reviewed by clinician on day of visit: allergies, medications, problem list, medical history, surgical history, family history, social history, and previous encounter notes.   I have personally spent 35 minutes total time today in preparation, patient care, nutritional counseling and documentation for this visit, including the following: review of clinical lab tests; review of medical tests/procedures/services.      Siara Gorder, PA-C

## 2022-05-15 NOTE — Assessment & Plan Note (Signed)
Hospitalized due to new onset A fib and converted to SR on Cardizem drip. She has remained in SR since hospitalization. Continues Xarelto for anticoagulation. Following up with cardiology regularly.  Has not taken metoprolol as had no recurrent episodes.   2D echo showed:  Echo 04/16/22 demonstrated   1. Left ventricular ejection fraction, by estimation, is 60 to 65%. The  left ventricle has normal function. The left ventricle has no regional  wall motion abnormalities. Left ventricular diastolic parameters were  normal.   2. Right ventricular systolic function is normal. The right ventricular  size is normal.   3. No evidence of mitral valve regurgitation.   4. The aortic valve is tricuspid. There is mild calcification of the  aortic valve. Aortic valve regurgitation is not visualized.   5. Aneurysm of the ascending aorta, measuring 42 mm.   6. The inferior vena cava is normal in size with greater than 50%  respiratory variability, suggesting right atrial pressure of 3 mmHg.    Ascending AA 42 mm will need FU Echo 1 yr

## 2022-06-09 ENCOUNTER — Emergency Department (HOSPITAL_COMMUNITY)
Admission: EM | Admit: 2022-06-09 | Discharge: 2022-06-09 | Disposition: A | Discharge status: Home / Self Care | Payer: BC Managed Care – PPO | Attending: Emergency Medicine | Admitting: Emergency Medicine

## 2022-06-09 ENCOUNTER — Other Ambulatory Visit: Payer: Self-pay

## 2022-06-09 ENCOUNTER — Encounter (HOSPITAL_COMMUNITY): Payer: Self-pay

## 2022-06-09 DIAGNOSIS — I10 Essential (primary) hypertension: Secondary | ICD-10-CM | POA: Insufficient documentation

## 2022-06-09 DIAGNOSIS — R040 Epistaxis: Secondary | ICD-10-CM | POA: Diagnosis not present

## 2022-06-09 MED ORDER — ACETAMINOPHEN 325 MG PO TABS
650.0000 mg | ORAL_TABLET | Freq: Once | ORAL | Status: AC
  Administered 2022-06-09: 650 mg via ORAL
  Filled 2022-06-09: qty 2

## 2022-06-09 NOTE — Discharge Instructions (Signed)

## 2022-06-09 NOTE — ED Notes (Signed)
No current bleeding.  Does complain of sinus pressure with tenderness upon palpation.  General weakness and aching headache.

## 2022-06-09 NOTE — ED Provider Notes (Signed)
[place] EMERGENCY DEPARTMENT AT [facility] Provider Note   CSN: [id number] Arrival date & time: 06/09/22  1138     History  No chief complaint on file.   Linda Olson is a 62 y.o. female.  HPI     Pt comes in with cc of nose bleed. Pt has hx of HTN, AF and is on xarelto. Pt was in a grocery store when she started having bleeding from the right nare.  She has had bleeding from the same nare in the past.  This episode was more severe, and EMS was called.  Her blood pressure initially was 240 and she also reports associated mild to moderate headache.  She has no chest pain, shortness of breath.  Home Medications Prior to Admission medications   Medication Sig Start Date End Date Taking? Authorizing Provider  APPLE CIDER VINEGAR PO Take 1 tablet by mouth every morning.    [provider]  atorvastatin (LIPITOR) 40 MG tablet Take 1 tablet (40 mg total) by mouth daily. 04/17/22   Quillian Quince D, MD  losartan-hydrochlorothiazide (HYZAAR) 50-12.5 MG tablet Take 1 tablet by mouth daily. 07/21/18   [provider]  methocarbamol (ROBAXIN) 500 MG tablet Take 1 tablet (500 mg total) by mouth every 8 (eight) hours as needed for muscle spasms. Patient taking differently: Take 500 mg by mouth daily as needed for muscle spasms. 09/20/21   Naida Sleight, PA-C  metoprolol tartrate (LOPRESSOR) 25 MG tablet Take 25 mg by mouth 2 (two) times daily. Pill in pocket 04/24/22   [provider]  Misc Natural Products (YUMVS BEET ROOT-TART CHERRY PO) Take 1 tablet by mouth every morning.    [provider]  rivaroxaban (XARELTO) 20 MG TABS tablet Take 1 tablet (20 mg total) by mouth daily with supper. 04/22/22   Arnetha Courser, MD  tirzepatide [facility]) 10 MG/0.5ML Pen Inject 10 mg into the skin once a week. 05/15/22   Rayburn, Fanny Bien, PA-C  VITAMIN D, CHOLECALCIFEROL, PO Take 2,000 mg by mouth every morning.    [provider]       Allergies    Metformin and related, Other, and Oxycontin [oxycodone]    Review of Systems   Review of Systems  All other systems reviewed and are negative.   Physical Exam Updated Vital Signs BP (!) 141/92   Pulse 64   Temp 98.6 F (37 C)   Resp 18   Ht 5' (1.524 m)   Wt 94.8 kg   SpO2 100%   BMI 40.82 kg/m  Physical Exam Vitals and nursing note reviewed.  Constitutional:      Appearance: She is well-developed.  HENT:     Head: Atraumatic.     Nose:     Comments: Patient has a lesion in the right turbinate, which looks like a very superficial laceration/abrasion.  No active bleeding Eyes:     Extraocular Movements: Extraocular movements intact.     Pupils: Pupils are equal, round, and reactive to light.  Cardiovascular:     Rate and Rhythm: Normal rate.  Pulmonary:     Effort: Pulmonary effort is normal.  Musculoskeletal:     Cervical back: Normal range of motion and neck supple.  Skin:    General: Skin is warm and dry.  Neurological:     Mental Status: She is alert and oriented to person, place, and time.     ED Results / Procedures / Treatments   Labs (all labs  ordered are listed, but only abnormal results are displayed) Labs Reviewed - No data to display  EKG None  Radiology No results found.  Procedures Procedures    Medications Ordered in ED Medications  acetaminophen (TYLENOL) tablet 650 mg (has no administration in time range)    ED Course/ Medical Decision Making/ A&P                             Medical Decision Making Risk OTC drugs.   62 year old patient comes in with chief complaint of nosebleed.  She has history of recurrent nosebleed, but they are usually self-limiting, and get better with just wiping of the nose.  This episode started while she was at the grocery store and was more violent.  Patient indicates that with holding pressure, the bleed has spontaneously stopped.  Patient is on Xarelto for A-fib.  Initially her blood  pressure was 240 systolic.  She did not take her blood pressure medicine today.  Now her blood pressure is in the 140 systolic.  Exam is reassuring.  And over hour and a half in the emergency room she has not had any episodes of bleeding.  We have not had to give patient even an Afrin spray.  She is complaining of mild headache, therefore Tylenol has been given.  She is stable for discharge.  Return precautions have been discussed.  Patient's headaches do not appear to be related to her elevated blood pressure.  Her BP has improved on its own.  Headaches are mild, no additional workup needed for it. Final Clinical Impression(s) / ED Diagnoses Final diagnoses:  Epistaxis    Rx / DC Orders ED Discharge Orders     None         Derwood Kaplan, MD 06/09/22 1332

## 2022-06-09 NOTE — ED Triage Notes (Addendum)
Pt c/o nose bleed this am (controlled at this time), HA, and HTN; states she took bp at grocery store, BP 240 systolic; did not take BP meds today; pt takes xarelto for afib

## 2022-06-14 ENCOUNTER — Ambulatory Visit (INDEPENDENT_AMBULATORY_CARE_PROVIDER_SITE_OTHER): Payer: BC Managed Care – PPO | Admitting: Family Medicine

## 2022-06-14 ENCOUNTER — Other Ambulatory Visit: Payer: Self-pay

## 2022-06-14 ENCOUNTER — Telehealth (INDEPENDENT_AMBULATORY_CARE_PROVIDER_SITE_OTHER): Payer: Self-pay | Admitting: Family Medicine

## 2022-06-14 ENCOUNTER — Encounter (INDEPENDENT_AMBULATORY_CARE_PROVIDER_SITE_OTHER): Payer: Self-pay | Admitting: Family Medicine

## 2022-06-14 ENCOUNTER — Other Ambulatory Visit (HOSPITAL_COMMUNITY): Payer: Self-pay

## 2022-06-14 VITALS — BP 106/67 | HR 70 | Temp 97.7°F | Ht 60.0 in | Wt 209.0 lb

## 2022-06-14 DIAGNOSIS — E7800 Pure hypercholesterolemia, unspecified: Secondary | ICD-10-CM

## 2022-06-14 DIAGNOSIS — Z6841 Body Mass Index (BMI) 40.0 and over, adult: Secondary | ICD-10-CM

## 2022-06-14 DIAGNOSIS — E1169 Type 2 diabetes mellitus with other specified complication: Secondary | ICD-10-CM

## 2022-06-14 DIAGNOSIS — E669 Obesity, unspecified: Secondary | ICD-10-CM

## 2022-06-14 DIAGNOSIS — Z7985 Long-term (current) use of injectable non-insulin antidiabetic drugs: Secondary | ICD-10-CM

## 2022-06-14 MED ORDER — ATORVASTATIN CALCIUM 40 MG PO TABS
40.0000 mg | ORAL_TABLET | Freq: Every day | ORAL | 0 refills | Status: DC
  Filled 2022-06-14: qty 30, 30d supply, fill #0

## 2022-06-14 MED ORDER — TIRZEPATIDE 7.5 MG/0.5ML ~~LOC~~ SOAJ
7.5000 mg | SUBCUTANEOUS | 0 refills | Status: DC
  Filled 2022-06-14: qty 2, 28d supply, fill #0

## 2022-06-14 MED ORDER — TIRZEPATIDE 10 MG/0.5ML ~~LOC~~ SOAJ
10.0000 mg | SUBCUTANEOUS | 0 refills | Status: DC
  Filled 2022-06-14: qty 2, 28d supply, fill #0

## 2022-06-14 NOTE — Telephone Encounter (Signed)
Ok to change to 7.5

## 2022-06-14 NOTE — Telephone Encounter (Signed)
Patient stated the pharmacy has only 7.5 of Mounjaro. Can we refill this dosage instead at her pharmacy? Please give her a call once sent over

## 2022-06-18 NOTE — Progress Notes (Unsigned)
Chief Complaint:   OBESITY Linda Olson is here to discuss her progress with her obesity treatment plan along with follow-up of her obesity related diagnoses. Linda Olson is on the Category 2 Plan and states she is following her eating plan approximately 60% of the time. Linda Olson states she is doing 0 minutes 0 times per week.  Today's visit was #: 25 Starting weight: 237 lbs Starting date: 04/28/2020 Today's weight: 209 lbs Today's date: 06/13/2022 Total lbs lost to date: 28 Total lbs lost since last in-office visit: 0  Interim History: Linda Olson has done well with maintaining her weight loss.  She has been walking most days at work.  She is working on walking 1 mile per day in addition to her daily activities.  Subjective:   1. Type 2 diabetes mellitus with other specified complication, without long-term current use of insulin Linda Olson is working on her diet, and still notes polyphagia, but she cannot find a high dose due to drug shortages.  2. Pure hypercholesterolemia Linda Olson is on Lipitor, and she is trying to decrease cholesterol in her diet.  She denies chest pain or myalgias.  Assessment/Plan:   1. Type 2 diabetes mellitus with other specified complication, without long-term current use of insulin Linda Olson will continue with her diet and Linda Olson, and we will refill Linda Olson 7.5 mg once weekly for 1 month.  - tirzepatide (Linda Olson) 7.5 MG/0.5ML Pen; Inject 7.5 mg into the skin once a week.  Dispense: 6 mL; Refill: 0  2. Pure hypercholesterolemia Linda Olson will continue with her diet and Lipitor, and we will refill Lipitor for 1 month.  - atorvastatin (LIPITOR) 40 MG tablet; Take 1 tablet (40 mg total) by mouth daily.  Dispense: 30 tablet; Refill: 0  3. BMI 40.0-44.9, adult  4. Obesity, Beginning BMI 46.29 Linda Olson is currently in the action stage of change. As such, her goal is to continue with weight loss efforts. She has agreed to the Category 2 Plan.    Exercise goals: For substantial health benefits, adults should do at least 150 minutes (2 hours and 30 minutes) a week of moderate-intensity, or 75 minutes (1 hour and 15 minutes) a week of vigorous-intensity aerobic physical activity, or an equivalent combination of moderate- and vigorous-intensity aerobic activity. Aerobic activity should be performed in episodes of at least 10 minutes, and preferably, it should be spread throughout the week.  Behavioral modification strategies: increasing lean protein intake.  [name] has agreed to follow-up with our clinic in 3 to 4 weeks. She was informed of the importance of frequent follow-up visits to maximize her success with intensive lifestyle modifications for her multiple health conditions.   Objective:   Blood pressure 106/67, pulse 70, temperature 97.7 F (36.5 C), height 5' (1.524 m), weight 209 lb (94.8 kg), SpO2 94 %. Body mass index is 40.82 kg/m.  Lab Results  Component Value Date   CREATININE 0.98 04/22/2022   BUN 24 (H) 04/22/2022   NA 137 04/22/2022   K 4.1 04/22/2022   CL 107 04/22/2022   CO2 22 04/22/2022   Lab Results  Component Value Date   ALT 35 04/21/2022   AST 35 04/21/2022   ALKPHOS 118 04/21/2022   BILITOT 0.5 04/21/2022   Lab Results  Component Value Date   HGBA1C 6.0 (H) 12/20/2021   HGBA1C 6.2 (H) 03/30/2021   HGBA1C 6.0 (H) 08/09/2020   HGBA1C 8.2 (H) 04/29/2020   HGBA1C 6.2 (H) 03/19/2019   Lab Results  Component Value Date  INSULIN 19.5 12/20/2021   INSULIN 16.0 08/09/2020   INSULIN 22.1 04/29/2020   Lab Results  Component Value Date   TSH 2.094 04/21/2022   Lab Results  Component Value Date   CHOL 235 (H) 12/20/2021   HDL 57 12/20/2021   LDLCALC 165 (H) 12/20/2021   TRIG 77 12/20/2021   CHOLHDL 4.3 03/30/2021   Lab Results  Component Value Date   VD25OH 28.8 (L) 12/20/2021   VD25OH 40.3 03/30/2021   VD25OH 65.9 08/09/2020   Lab Results  Component Value Date   WBC 7.7  04/21/2022   HGB 12.4 04/21/2022   HCT 37.6 04/21/2022   MCV 89.1 04/21/2022   PLT 325 04/21/2022   Lab Results  Component Value Date   IRON 53 03/30/2021   TIBC 298 03/30/2021   FERRITIN 157 (H) 03/30/2021   Attestation Statements:   Reviewed by clinician on day of visit: allergies, medications, problem list, medical history, surgical history, family history, social history, and previous encounter notes.   I, Burt Knack, am acting as transcriptionist for Quillian Quince, MD.  I have reviewed the above documentation for accuracy and completeness, and I agree with the above. -  Quillian Quince, MD

## 2022-06-27 DIAGNOSIS — R040 Epistaxis: Secondary | ICD-10-CM | POA: Diagnosis not present

## 2022-06-27 DIAGNOSIS — E1169 Type 2 diabetes mellitus with other specified complication: Secondary | ICD-10-CM | POA: Diagnosis not present

## 2022-06-27 DIAGNOSIS — E785 Hyperlipidemia, unspecified: Secondary | ICD-10-CM | POA: Diagnosis not present

## 2022-07-17 ENCOUNTER — Other Ambulatory Visit (HOSPITAL_COMMUNITY): Payer: Self-pay

## 2022-07-17 ENCOUNTER — Ambulatory Visit (INDEPENDENT_AMBULATORY_CARE_PROVIDER_SITE_OTHER): Payer: BC Managed Care – PPO | Admitting: Family Medicine

## 2022-07-17 VITALS — BP 105/65 | HR 64 | Temp 98.1°F | Ht 65.0 in | Wt 206.0 lb

## 2022-07-17 DIAGNOSIS — E669 Obesity, unspecified: Secondary | ICD-10-CM

## 2022-07-17 DIAGNOSIS — Z6841 Body Mass Index (BMI) 40.0 and over, adult: Secondary | ICD-10-CM | POA: Diagnosis not present

## 2022-07-17 DIAGNOSIS — Z7985 Long-term (current) use of injectable non-insulin antidiabetic drugs: Secondary | ICD-10-CM

## 2022-07-17 DIAGNOSIS — E1169 Type 2 diabetes mellitus with other specified complication: Secondary | ICD-10-CM | POA: Diagnosis not present

## 2022-07-17 MED ORDER — TIRZEPATIDE 10 MG/0.5ML ~~LOC~~ SOAJ
10.0000 mg | SUBCUTANEOUS | 0 refills | Status: DC
  Filled 2022-07-17: qty 2, 28d supply, fill #0

## 2022-07-18 NOTE — Progress Notes (Signed)
Chief Complaint:   OBESITY Linda Olson is here to discuss her progress with her obesity treatment plan along with follow-up of her obesity related diagnoses. Linda Olson is on the Category 2 Plan and states she is following her eating plan approximately 40-60% of the time. Linda Olson states she is doing 0 minutes 0 times per week.  Today's visit was #: 26 Starting weight: 237 lbs Starting date: 04/28/2020 Today's weight: 206 lbs Today's date: 07/17/2022 Total lbs lost to date: 31 Total lbs lost since last in-office visit: 3  Interim History: Linda Olson has done well with her weight loss over the last month. She notes increase temptation, but she is still doing well with meeting her protein goals.   Subjective:   1. Type 2 diabetes mellitus with other specified complication, without long-term current use of insulin (HCC) Linda Olson had her Mounjaro dose decrease due to medication shortages. She notes increased polyphagia.   Assessment/Plan:   1. Type 2 diabetes mellitus with other specified complication, without long-term current use of insulin (HCC) Linda Olson agreed to increase Mounjaro to 10 mg once weekly, and we will follow-up in 1 month.   - tirzepatide (MOUNJARO) 10 MG/0.5ML Pen; Inject 10 mg into the skin once a week.  Dispense: 6 mL; Refill: 0  2. BMI 40.0-44.9, adult (HCC)  3. Obesity, Beginning BMI 46.29 Linda Olson is currently in the action stage of change. As such, her goal is to continue with weight loss efforts. She has agreed to the Category 2 Plan.   Exercise goals: Increase walking.   Behavioral modification strategies: no skipping meals.  Linda Olson has agreed to follow-up with our clinic in 4 weeks. She was informed of the importance of frequent follow-up visits to maximize her success with intensive lifestyle modifications for her multiple health conditions.   Objective:   Blood pressure 105/65, pulse 64, temperature 98.1 F (36.7 C), height 5\' 5"  (1.651 m),  weight 206 lb (93.4 kg), SpO2 100 %. Body mass index is 34.28 kg/m.  Lab Results  Component Value Date   CREATININE 0.98 04/22/2022   BUN 24 (H) 04/22/2022   NA 137 04/22/2022   K 4.1 04/22/2022   CL 107 04/22/2022   CO2 22 04/22/2022   Lab Results  Component Value Date   ALT 35 04/21/2022   AST 35 04/21/2022   ALKPHOS 118 04/21/2022   BILITOT 0.5 04/21/2022   Lab Results  Component Value Date   HGBA1C 6.0 (H) 12/20/2021   HGBA1C 6.2 (H) 03/30/2021   HGBA1C 6.0 (H) 08/09/2020   HGBA1C 8.2 (H) 04/29/2020   HGBA1C 6.2 (H) 03/19/2019   Lab Results  Component Value Date   INSULIN 19.5 12/20/2021   INSULIN 16.0 08/09/2020   INSULIN 22.1 04/29/2020   Lab Results  Component Value Date   TSH 2.094 04/21/2022   Lab Results  Component Value Date   CHOL 235 (H) 12/20/2021   HDL 57 12/20/2021   LDLCALC 165 (H) 12/20/2021   TRIG 77 12/20/2021   CHOLHDL 4.3 03/30/2021   Lab Results  Component Value Date   VD25OH 28.8 (L) 12/20/2021   VD25OH 40.3 03/30/2021   VD25OH 65.9 08/09/2020   Lab Results  Component Value Date   WBC 7.7 04/21/2022   HGB 12.4 04/21/2022   HCT 37.6 04/21/2022   MCV 89.1 04/21/2022   PLT 325 04/21/2022   Lab Results  Component Value Date   IRON 53 03/30/2021   TIBC 298 03/30/2021   FERRITIN 157 (H) 03/30/2021  Attestation Statements:   Reviewed by clinician on day of visit: allergies, medications, problem list, medical history, surgical history, family history, social history, and previous encounter notes.  Time spent on visit including pre-visit chart review and post-visit care and charting was 30 minutes.   I, Burt Knack, am acting as transcriptionist for Quillian Quince, MD.  I have reviewed the above documentation for accuracy and completeness, and I agree with the above. -  Quillian Quince, MD

## 2022-07-20 ENCOUNTER — Other Ambulatory Visit: Payer: Self-pay

## 2022-07-24 DIAGNOSIS — R040 Epistaxis: Secondary | ICD-10-CM | POA: Diagnosis not present

## 2022-07-25 ENCOUNTER — Other Ambulatory Visit (HOSPITAL_COMMUNITY): Payer: Self-pay

## 2022-07-31 ENCOUNTER — Other Ambulatory Visit (HOSPITAL_COMMUNITY): Payer: Self-pay

## 2022-08-01 ENCOUNTER — Other Ambulatory Visit (HOSPITAL_COMMUNITY): Payer: Self-pay

## 2022-08-02 ENCOUNTER — Other Ambulatory Visit (HOSPITAL_COMMUNITY): Payer: Self-pay

## 2022-08-04 DIAGNOSIS — I480 Paroxysmal atrial fibrillation: Secondary | ICD-10-CM | POA: Diagnosis not present

## 2022-08-04 DIAGNOSIS — Z7901 Long term (current) use of anticoagulants: Secondary | ICD-10-CM | POA: Diagnosis not present

## 2022-08-04 DIAGNOSIS — R040 Epistaxis: Secondary | ICD-10-CM | POA: Diagnosis not present

## 2022-08-16 ENCOUNTER — Ambulatory Visit (INDEPENDENT_AMBULATORY_CARE_PROVIDER_SITE_OTHER): Payer: BC Managed Care – PPO | Admitting: Family Medicine

## 2022-08-28 ENCOUNTER — Ambulatory Visit (INDEPENDENT_AMBULATORY_CARE_PROVIDER_SITE_OTHER): Payer: BC Managed Care – PPO | Admitting: Family Medicine

## 2022-08-28 ENCOUNTER — Other Ambulatory Visit: Payer: Self-pay

## 2022-08-28 ENCOUNTER — Encounter (INDEPENDENT_AMBULATORY_CARE_PROVIDER_SITE_OTHER): Payer: Self-pay | Admitting: Family Medicine

## 2022-08-28 ENCOUNTER — Other Ambulatory Visit (HOSPITAL_COMMUNITY): Payer: Self-pay

## 2022-08-28 ENCOUNTER — Other Ambulatory Visit (INDEPENDENT_AMBULATORY_CARE_PROVIDER_SITE_OTHER): Payer: Self-pay | Admitting: Family Medicine

## 2022-08-28 VITALS — BP 127/69 | HR 59 | Temp 98.3°F | Ht 65.0 in | Wt 204.0 lb

## 2022-08-28 DIAGNOSIS — Z6833 Body mass index (BMI) 33.0-33.9, adult: Secondary | ICD-10-CM | POA: Diagnosis not present

## 2022-08-28 DIAGNOSIS — R040 Epistaxis: Secondary | ICD-10-CM | POA: Diagnosis not present

## 2022-08-28 DIAGNOSIS — E669 Obesity, unspecified: Secondary | ICD-10-CM

## 2022-08-28 DIAGNOSIS — I10 Essential (primary) hypertension: Secondary | ICD-10-CM

## 2022-08-28 DIAGNOSIS — Z7985 Long-term (current) use of injectable non-insulin antidiabetic drugs: Secondary | ICD-10-CM

## 2022-08-28 DIAGNOSIS — E1169 Type 2 diabetes mellitus with other specified complication: Secondary | ICD-10-CM | POA: Diagnosis not present

## 2022-08-28 DIAGNOSIS — Z6834 Body mass index (BMI) 34.0-34.9, adult: Secondary | ICD-10-CM

## 2022-08-28 MED ORDER — TIRZEPATIDE 10 MG/0.5ML ~~LOC~~ SOAJ
10.0000 mg | SUBCUTANEOUS | 0 refills | Status: DC
  Filled 2022-08-28: qty 2, 28d supply, fill #0

## 2022-08-28 MED ORDER — LOSARTAN POTASSIUM-HCTZ 50-12.5 MG PO TABS: 1.0000 | ORAL_TABLET | Freq: Every day | ORAL | 0 refills | Status: DC

## 2022-08-28 NOTE — Progress Notes (Signed)
.smr  Office: [phone]  /  Fax: [phone]  WEIGHT SUMMARY AND BIOMETRICS  Anthropometric Measurements Height: 5\' 5"  (1.651 m) Weight: 204 lb (92.5 kg) BMI (Calculated): 33.95 Weight at Last Visit: 206 lb Weight Lost Since Last Visit: 2 lb Total Weight Loss (lbs): 33 lb (15 kg)   Body Composition  Body Fat %: 41.9 % Fat Mass (lbs): 85.8 lbs Muscle Mass (lbs): 112.8 lbs Total Body Water (lbs): 86 lbs Visceral Fat Rating : 12   Other Clinical Data Fasting: No Labs: No Today's Visit #: 28    Chief Complaint: OBESITY   Discussed the use of AI scribe software for clinical note transcription with the patient, who gave verbal consent to proceed.  History of Present Illness   The patient, with a history of obesity, diabetes, and hypertension, presents for a routine follow-up. They report a successful weight loss journey, having lost an additional two pounds since the last visit. They deny experiencing hunger and have been adhering to a structured meal plan, consuming three meals a day and avoiding excessive sweets. They have also incorporated walking into their routine, managing to walk four days a week.  However, the patient experienced a setback with a nosebleed that required hospitalization and cauterization. Despite the procedure, the nosebleed recurred the following day. The cause of these nosebleeds remains unknown to the patient.  The patient also experienced a delay in receiving their medication, Joycelyn Man, due to a pharmacy shortage. They were without their medication, Losartan, for a week due to an oversight in their medication management.  The patient has been struggling with adherence to the category two diet plan but expresses a commitment to improve. They have been consuming two packs of tuna daily for lunch to meet their protein needs. They also express a preference for adding mayonnaise to their tuna packs, which they plan to account for in their snack calories.           PHYSICAL EXAM:  Blood pressure 127/69, pulse (!) 59, temperature 98.3 F (36.8 C), height 5\' 5"  (1.651 m), weight 204 lb (92.5 kg), SpO2 97 %. Body mass index is 33.95 kg/m.  DIAGNOSTIC DATA REVIEWED:  BMET    Component Value Date/Time   NA 137 04/22/2022 0817   NA 140 12/20/2021 1052   K 4.1 04/22/2022 0817   CL 107 04/22/2022 0817   CO2 22 04/22/2022 0817   GLUCOSE 102 (H) 04/22/2022 0817   BUN 24 (H) 04/22/2022 0817   BUN 21 12/20/2021 1052   CREATININE 0.98 04/22/2022 0817   CALCIUM 8.5 (L) 04/22/2022 0817   GFRNONAA >60 04/22/2022 0817   GFRAA 101 04/29/2020 0813   Lab Results  Component Value Date   HGBA1C 6.0 (H) 12/20/2021   HGBA1C 6.2 (H) 03/19/2019   Lab Results  Component Value Date   INSULIN 19.5 12/20/2021   INSULIN 22.1 04/29/2020   Lab Results  Component Value Date   TSH 2.094 04/21/2022   CBC    Component Value Date/Time   WBC 7.7 04/21/2022 2246   RBC 4.22 04/21/2022 2246   HGB 12.4 04/21/2022 2246   HGB 12.4 03/30/2021 0755   HCT 37.6 04/21/2022 2246   HCT 37.1 03/30/2021 0755   PLT 325 04/21/2022 2246   PLT 366 03/30/2021 0755   MCV 89.1 04/21/2022 2246   MCV 87 03/30/2021 0755   MCH 29.4 04/21/2022 2246   MCHC 33.0 04/21/2022 2246   RDW 13.3 04/21/2022 2246   RDW 12.7 03/30/2021 0755  Iron Studies    Component Value Date/Time   IRON 53 03/30/2021 0755   TIBC 298 03/30/2021 0755   FERRITIN 157 (H) 03/30/2021 0755   IRONPCTSAT 18 03/30/2021 0755   Lipid Panel     Component Value Date/Time   CHOL 235 (H) 12/20/2021 1052   TRIG 77 12/20/2021 1052   HDL 57 12/20/2021 1052   CHOLHDL 4.3 03/30/2021 0755   CHOLHDL 3.5 03/19/2019 0505   VLDL 13 03/19/2019 0505   LDLCALC 165 (H) 12/20/2021 1052   Hepatic Function Panel     Component Value Date/Time   PROT 7.4 04/21/2022 2246   PROT 7.1 12/20/2021 1052   ALBUMIN 3.4 (L) 04/21/2022 2246   ALBUMIN 4.0 12/20/2021 1052   AST 35 04/21/2022 2246   ALT 35 04/21/2022  2246   ALKPHOS 118 04/21/2022 2246   BILITOT 0.5 04/21/2022 2246   BILITOT 0.4 12/20/2021 1052   BILIDIR <0.1 04/21/2022 2246   IBILI NOT CALCULATED 04/21/2022 2246      Component Value Date/Time   TSH 2.094 04/21/2022 2246   TSH 1.380 03/30/2021 0755   Nutritional Lab Results  Component Value Date   VD25OH 28.8 (L) 12/20/2021   VD25OH 40.3 03/30/2021   VD25OH 65.9 08/09/2020     Assessment and Plan    Obesity: Continued weight loss noted. Patient is adhering to dietary recommendations and has initiated a walking regimen. Struggling with adherence to category two plans. -Encourage continuation of current dietary habits and exercise regimen. -Provide a new copy of category two plans and a grocery shopping list to aid in meal planning.  Type 2 Diabetes: Last HbA1c was 6.0 in October. Patient had difficulty obtaining Mounjaro but is now back on 10mg  weekly. -Refill Mounjaro 10mg  weekly. -Check HbA1c at next visit.  Hypertension: Patient has been without Losartan for a week. -Refill Losartan. -Advise patient to use non-urgent message feature on MyChart for future refill requests.   Follow-up in 4 weeks. Check labs including glucose, vitamin levels, kidney and liver function.       I have personally spent 47 minutes total time today in preparation, patient care, and documentation for this visit, including the following: review of clinical lab tests; review of medical tests/procedures/services.    She was informed of the importance of frequent follow up visits to maximize her success with intensive lifestyle modifications for her multiple health conditions.    Quillian Quince, MD

## 2022-08-30 ENCOUNTER — Other Ambulatory Visit (HOSPITAL_COMMUNITY): Payer: Self-pay

## 2022-08-30 ENCOUNTER — Other Ambulatory Visit (INDEPENDENT_AMBULATORY_CARE_PROVIDER_SITE_OTHER): Payer: Self-pay | Admitting: Family Medicine

## 2022-08-30 DIAGNOSIS — E7800 Pure hypercholesterolemia, unspecified: Secondary | ICD-10-CM

## 2022-09-10 ENCOUNTER — Other Ambulatory Visit (HOSPITAL_COMMUNITY): Payer: Self-pay

## 2022-09-11 ENCOUNTER — Other Ambulatory Visit (HOSPITAL_COMMUNITY): Payer: Self-pay

## 2022-09-12 ENCOUNTER — Telehealth (INDEPENDENT_AMBULATORY_CARE_PROVIDER_SITE_OTHER): Payer: Self-pay | Admitting: Family Medicine

## 2022-09-12 NOTE — Telephone Encounter (Signed)
7/10 Patient stated thats the pharmancy will not fill the losartan medication. Patient stated that she hasnt had her medication for a month. Please advise. Erie Noe

## 2022-09-13 ENCOUNTER — Other Ambulatory Visit: Payer: Self-pay

## 2022-09-13 ENCOUNTER — Other Ambulatory Visit (INDEPENDENT_AMBULATORY_CARE_PROVIDER_SITE_OTHER): Payer: Self-pay | Admitting: Family Medicine

## 2022-09-13 DIAGNOSIS — E1169 Type 2 diabetes mellitus with other specified complication: Secondary | ICD-10-CM

## 2022-09-13 NOTE — Telephone Encounter (Signed)
Patient called in this morning and was informed prescription was sent to [facility] and not [facility]. Patient verbalized understanding and stated she would contact [facility] to get refill.

## 2022-09-19 ENCOUNTER — Other Ambulatory Visit (INDEPENDENT_AMBULATORY_CARE_PROVIDER_SITE_OTHER): Payer: Self-pay | Admitting: Family Medicine

## 2022-09-19 ENCOUNTER — Telehealth: Payer: Self-pay

## 2022-09-19 DIAGNOSIS — E1169 Type 2 diabetes mellitus with other specified complication: Secondary | ICD-10-CM

## 2022-09-26 ENCOUNTER — Other Ambulatory Visit (HOSPITAL_COMMUNITY): Payer: Self-pay

## 2022-09-26 ENCOUNTER — Other Ambulatory Visit (INDEPENDENT_AMBULATORY_CARE_PROVIDER_SITE_OTHER): Payer: Self-pay | Admitting: Family Medicine

## 2022-09-26 DIAGNOSIS — E7800 Pure hypercholesterolemia, unspecified: Secondary | ICD-10-CM

## 2022-09-26 DIAGNOSIS — E1169 Type 2 diabetes mellitus with other specified complication: Secondary | ICD-10-CM

## 2022-10-01 DIAGNOSIS — R040 Epistaxis: Secondary | ICD-10-CM | POA: Diagnosis not present

## 2022-10-02 ENCOUNTER — Other Ambulatory Visit (HOSPITAL_COMMUNITY): Payer: Self-pay

## 2022-10-02 ENCOUNTER — Ambulatory Visit (INDEPENDENT_AMBULATORY_CARE_PROVIDER_SITE_OTHER): Payer: BC Managed Care – PPO | Admitting: Family Medicine

## 2022-10-02 ENCOUNTER — Encounter (INDEPENDENT_AMBULATORY_CARE_PROVIDER_SITE_OTHER): Payer: Self-pay | Admitting: Family Medicine

## 2022-10-02 VITALS — BP 144/82 | HR 57 | Temp 98.0°F | Ht 65.0 in | Wt 215.0 lb

## 2022-10-02 DIAGNOSIS — E1169 Type 2 diabetes mellitus with other specified complication: Secondary | ICD-10-CM

## 2022-10-02 DIAGNOSIS — I10 Essential (primary) hypertension: Secondary | ICD-10-CM

## 2022-10-02 DIAGNOSIS — E669 Obesity, unspecified: Secondary | ICD-10-CM

## 2022-10-02 DIAGNOSIS — E7800 Pure hypercholesterolemia, unspecified: Secondary | ICD-10-CM | POA: Diagnosis not present

## 2022-10-02 DIAGNOSIS — Z6835 Body mass index (BMI) 35.0-35.9, adult: Secondary | ICD-10-CM

## 2022-10-02 DIAGNOSIS — Z7985 Long-term (current) use of injectable non-insulin antidiabetic drugs: Secondary | ICD-10-CM

## 2022-10-02 MED ORDER — ATORVASTATIN CALCIUM 40 MG PO TABS
40.0000 mg | ORAL_TABLET | Freq: Every day | ORAL | 0 refills | Status: DC
  Filled 2022-10-02: qty 30, 30d supply, fill #0

## 2022-10-02 MED ORDER — ATORVASTATIN CALCIUM 40 MG PO TABS: 40.0000 mg | ORAL_TABLET | Freq: Every day | ORAL | 0 refills | Status: DC

## 2022-10-02 MED ORDER — TIRZEPATIDE 10 MG/0.5ML ~~LOC~~ SOAJ
10.0000 mg | SUBCUTANEOUS | 0 refills | Status: DC
  Filled 2022-10-02: qty 2, 28d supply, fill #0

## 2022-10-02 NOTE — Progress Notes (Signed)
Chief Complaint:   OBESITY Linda Olson is here to discuss her progress with her obesity treatment plan along with follow-up of her obesity related diagnoses. Linda Olson is on the Category 2 Plan and states she is following her eating plan approximately 60% of the time. Linda Olson states she is walking for 60 minutes 3 times per week.  Today's visit was #: 28 Starting weight: 237 lbs Starting date: 04/28/2020 Today's weight: 215 lbs Today's date: 10/02/2022 Total lbs lost to date: 22 Total lbs lost since last in-office visit: 0  Interim History: Patient off track with her eating plan. She will be traveling for her birthday. She is surprised at the amount of weight gain and she is working on getting back on track.    Subjective:   1. Type 2 diabetes mellitus with other specified complication, without long-term current use of insulin (HCC) Patient is on Maili, but her pharmacy was out. She is struggling to follow her eating plan.   2. Essential hypertension Patient's blood pressure is elevated today, and she may be missing doses of her medications.   3. Pure hypercholesterolemia Patient is on Lipitor, and she is struggling more with diet.   Assessment/Plan:   1. Type 2 diabetes mellitus with other specified complication, without long-term current use of insulin (HCC) Patient will continue Mounjaro 10 mg once weekly, and we will refill for 90 days.   - tirzepatide (MOUNJARO) 10 MG/0.5ML Pen; Inject 10 mg into the skin once a week.  Dispense: 6 mL; Refill: 0  2. Essential hypertension Patient will continue Hyzaar, and we will refill for 1 month. She will get back to her diet.   - losartan-hydrochlorothiazide (HYZAAR) 50-12.5 MG tablet; Take 1 tablet by mouth daily.  Dispense: 30 tablet; Refill: 0  3. Pure hypercholesterolemia We will refill Lipitor 40 mg once daily for 1 month. Patient will continue with her diet and exercises.   - atorvastatin (LIPITOR) 40 MG tablet; Take 1  tablet (40 mg total) by mouth daily.  Dispense: 30 tablet; Refill: 0  4. BMI 35.0-35.9,adult  5. Obesity, Beginning BMI 46.29 Linda Olson is currently in the action stage of change. As such, her goal is to continue with weight loss efforts. She has agreed to the Category 2 Plan.   Exercise goals: As is.   Behavioral modification strategies: increasing lean protein intake and travel eating strategies.  Linda Olson has agreed to follow-up with our clinic in 4 weeks. She was informed of the importance of frequent follow-up visits to maximize her success with intensive lifestyle modifications for her multiple health conditions.   Objective:   Blood pressure (!) 144/82, pulse (!) 57, temperature 98 F (36.7 C), height 5\' 5"  (1.651 m), weight 215 lb (97.5 kg), SpO2 98%. Body mass index is 35.78 kg/m.  Lab Results  Component Value Date   CREATININE 0.98 04/22/2022   BUN 24 (H) 04/22/2022   NA 137 04/22/2022   K 4.1 04/22/2022   CL 107 04/22/2022   CO2 22 04/22/2022   Lab Results  Component Value Date   ALT 35 04/21/2022   AST 35 04/21/2022   ALKPHOS 118 04/21/2022   BILITOT 0.5 04/21/2022   Lab Results  Component Value Date   HGBA1C 6.0 (H) 12/20/2021   HGBA1C 6.2 (H) 03/30/2021   HGBA1C 6.0 (H) 08/09/2020   HGBA1C 8.2 (H) 04/29/2020   HGBA1C 6.2 (H) 03/19/2019   Lab Results  Component Value Date   INSULIN 19.5 12/20/2021   INSULIN 16.0  08/09/2020   INSULIN 22.1 04/29/2020   Lab Results  Component Value Date   TSH 2.094 04/21/2022   Lab Results  Component Value Date   CHOL 235 (H) 12/20/2021   HDL 57 12/20/2021   LDLCALC 165 (H) 12/20/2021   TRIG 77 12/20/2021   CHOLHDL 4.3 03/30/2021   Lab Results  Component Value Date   VD25OH 28.8 (L) 12/20/2021   VD25OH 40.3 03/30/2021   VD25OH 65.9 08/09/2020   Lab Results  Component Value Date   WBC 7.7 04/21/2022   HGB 12.4 04/21/2022   HCT 37.6 04/21/2022   MCV 89.1 04/21/2022   PLT 325 04/21/2022   Lab Results   Component Value Date   IRON 53 03/30/2021   TIBC 298 03/30/2021   FERRITIN 157 (H) 03/30/2021   Attestation Statements:   Reviewed by clinician on day of visit: allergies, medications, problem list, medical history, surgical history, family history, social history, and previous encounter notes.   I, Burt Knack, am acting as transcriptionist for Quillian Quince, MD.  I have reviewed the above documentation for accuracy and completeness, and I agree with the above. -  Quillian Quince, MD

## 2022-10-03 ENCOUNTER — Other Ambulatory Visit (HOSPITAL_COMMUNITY): Payer: Self-pay

## 2022-10-09 ENCOUNTER — Other Ambulatory Visit (HOSPITAL_COMMUNITY): Payer: Self-pay

## 2022-10-09 MED ORDER — LOSARTAN POTASSIUM-HCTZ 50-12.5 MG PO TABS
1.0000 | ORAL_TABLET | Freq: Every day | ORAL | 0 refills | Status: DC
  Filled 2022-10-09: qty 30, 30d supply, fill #0

## 2022-10-23 ENCOUNTER — Other Ambulatory Visit (HOSPITAL_COMMUNITY): Payer: Self-pay

## 2022-10-26 ENCOUNTER — Encounter: Payer: BC Managed Care – PPO | Admitting: Cardiology

## 2022-10-26 NOTE — Progress Notes (Deleted)
Follow up visit  Subjective:   Linda Olson, female    DOB: 1960/09/01, 62 y.o.   MRN: [id number]    *** HPI  No chief complaint on file.   62 y.o. African American female with hypertension, hyperlipidemia, PAF, TAA  ***  ***  Current Outpatient Medications:    atorvastatin (LIPITOR) 40 MG tablet, Take 1 tablet (40 mg total) by mouth daily., Disp: 30 tablet, Rfl: 0   losartan-hydrochlorothiazide (HYZAAR) 50-12.5 MG tablet, Take 1 tablet by mouth daily., Disp: 30 tablet, Rfl: 0   methocarbamol (ROBAXIN) 500 MG tablet, Take 1 tablet (500 mg total) by mouth every 8 (eight) hours as needed for muscle spasms. (Patient taking differently: Take 500 mg by mouth daily as needed for muscle spasms.), Disp: 40 tablet, Rfl: 0   metoprolol tartrate (LOPRESSOR) 25 MG tablet, Take 25 mg by mouth 2 (two) times daily. Pill in pocket, Disp: , Rfl:    rivaroxaban (XARELTO) 20 MG TABS tablet, Take 1 tablet (20 mg total) by mouth daily with supper., Disp: 30 tablet, Rfl: 1   tirzepatide (MOUNJARO) 10 MG/0.5ML Pen, Inject 10 mg into the skin once a week., Disp: 6 mL, Rfl: 0   tirzepatide (MOUNJARO) 7.5 MG/0.5ML Pen, Inject 7.5 mg into the skin once a week., Disp: 6 mL, Rfl: 0   VITAMIN D, CHOLECALCIFEROL, PO, Take 2,000 mg by mouth every morning., Disp: , Rfl:    Cardiovascular & other pertient studies:  Reviewed external labs and tests, independently interpreted  *** EKG ***/***/202***: ***  Echocardiogram 04/16/2022: 1. Left ventricular ejection fraction, by estimation, is 60 to 65%. The  left ventricle has normal function. The left ventricle has no regional  wall motion abnormalities. Left ventricular diastolic parameters were  normal.   2. Right ventricular systolic function is normal. The right ventricular  size is normal.   3. No evidence of mitral valve regurgitation.   4. The aortic valve is tricuspid. There is mild calcification of the  aortic valve. Aortic valve  regurgitation is not visualized.   5. Aneurysm of the ascending aorta, measuring 42 mm.   6. The inferior vena cava is normal in size with greater than 50%  respiratory variability, suggesting right atrial pressure of 3 mmHg.   Comparison(s): No significant change from prior study.   Conclusion(s)/Recommendation(s): Normal biventricular function without  evidence of hemodynamically significant valvular heart disease.   Recent labs: 04/22/2022: Glucose 102, BUN/Cr 24/0.98. EGFR >60. Na/K 137/4.1. Rest of the CMP normal H/H 12/37. MCV 89. Platelets 325 HbA1C 6.0% Chol 235, TG 77, HDL 57, LDL 165 TSH 2.0 normal   *** ROS      *** There were no vitals filed for this visit.  There is no height or weight on file to calculate BMI. There were no vitals filed for this visit.  *** Objective:   Physical Exam          Visit diagnoses: No diagnosis found.   No orders of the defined types were placed in this encounter.    Medication changes this visit: There are no discontinued medications.  No orders of the defined types were placed in this encounter.    Assessment & Recommendations:   62 y.o. African American female with hypertension, hyperlipidemia, PAF, TAA  PAF: CHA2DS2VASc score 3, annual stroke risk 3.6%. Continue Xarelto 20 mg daily.  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

## 2022-10-31 ENCOUNTER — Telehealth (INDEPENDENT_AMBULATORY_CARE_PROVIDER_SITE_OTHER): Payer: Self-pay | Admitting: Family Medicine

## 2022-10-31 ENCOUNTER — Telehealth: Payer: Self-pay | Admitting: *Deleted

## 2022-10-31 NOTE — Telephone Encounter (Signed)
Contacting patient to make sure they know they are being seen at the [facility] location.

## 2022-10-31 NOTE — Telephone Encounter (Signed)
Left voicemail for patient regarding her appointment for tomorrow.

## 2022-10-31 NOTE — Telephone Encounter (Signed)
Patient stated that she is currently out of medication Linda Olson). The patient does have an appointment tomorrow on 8/29, but she wanted a message sent up. Please advise.

## 2022-11-01 ENCOUNTER — Encounter (INDEPENDENT_AMBULATORY_CARE_PROVIDER_SITE_OTHER): Payer: Self-pay | Admitting: Family Medicine

## 2022-11-01 ENCOUNTER — Other Ambulatory Visit: Payer: Self-pay

## 2022-11-01 ENCOUNTER — Other Ambulatory Visit (HOSPITAL_COMMUNITY): Payer: Self-pay

## 2022-11-01 ENCOUNTER — Telehealth (INDEPENDENT_AMBULATORY_CARE_PROVIDER_SITE_OTHER): Payer: BC Managed Care – PPO | Admitting: Family Medicine

## 2022-11-01 ENCOUNTER — Ambulatory Visit: Payer: BC Managed Care – PPO | Admitting: Family Medicine

## 2022-11-01 DIAGNOSIS — E1159 Type 2 diabetes mellitus with other circulatory complications: Secondary | ICD-10-CM

## 2022-11-01 DIAGNOSIS — E785 Hyperlipidemia, unspecified: Secondary | ICD-10-CM

## 2022-11-01 DIAGNOSIS — Z6835 Body mass index (BMI) 35.0-35.9, adult: Secondary | ICD-10-CM

## 2022-11-01 DIAGNOSIS — E1169 Type 2 diabetes mellitus with other specified complication: Secondary | ICD-10-CM | POA: Diagnosis not present

## 2022-11-01 DIAGNOSIS — Z7985 Long-term (current) use of injectable non-insulin antidiabetic drugs: Secondary | ICD-10-CM

## 2022-11-01 DIAGNOSIS — E669 Obesity, unspecified: Secondary | ICD-10-CM

## 2022-11-01 DIAGNOSIS — I152 Hypertension secondary to endocrine disorders: Secondary | ICD-10-CM

## 2022-11-01 MED ORDER — TIRZEPATIDE 10 MG/0.5ML ~~LOC~~ SOAJ
10.0000 mg | SUBCUTANEOUS | 1 refills | Status: DC
  Filled 2022-11-01: qty 2, 28d supply, fill #0

## 2022-11-01 MED ORDER — TIRZEPATIDE 12.5 MG/0.5ML ~~LOC~~ SOAJ
12.5000 mg | SUBCUTANEOUS | 0 refills | Status: DC
  Filled 2022-11-01: qty 2, 28d supply, fill #0

## 2022-11-01 MED ORDER — TIRZEPATIDE 12.5 MG/0.5ML ~~LOC~~ SOAJ
12.5000 mg | SUBCUTANEOUS | 1 refills | Status: DC
  Filled 2022-11-01: qty 2, 28d supply, fill #0

## 2022-11-01 MED ORDER — TIRZEPATIDE 10 MG/0.5ML ~~LOC~~ SOAJ
10.0000 mg | SUBCUTANEOUS | 0 refills | Status: DC
  Filled 2022-11-01: qty 2, 28d supply, fill #0

## 2022-11-01 MED ORDER — ATORVASTATIN CALCIUM 40 MG PO TABS
40.0000 mg | ORAL_TABLET | Freq: Every day | ORAL | 0 refills | Status: DC
  Filled 2022-11-01: qty 90, 90d supply, fill #0

## 2022-11-01 MED ORDER — LOSARTAN POTASSIUM-HCTZ 50-12.5 MG PO TABS
1.0000 | ORAL_TABLET | Freq: Every day | ORAL | 0 refills | Status: DC
  Filled 2022-11-01: qty 90, 90d supply, fill #0

## 2022-11-01 NOTE — Progress Notes (Signed)
TeleHealth Visit:  This visit was completed with telemedicine (audio/video) technology. Philisha has verbally consented to this TeleHealth visit. The patient is located at home, the provider is located at home. The participants in this visit include the listed provider and patient. The visit was conducted today via MyChart video.  OBESITY Linda Olson is here to discuss her progress with her obesity treatment plan along with follow-up of her obesity related diagnoses.   Today's visit was # 29 Starting weight: 237 lbs Starting date: 04/28/20 Weight at last in office visit: 215 lbs on 10/02/22 Total weight loss: 22 lbs at last in office visit on 10/02/22. Today's reported weight (11/01/22): none reported  Nutrition Plan: the Category 2 plan - 70% adherence.  Current exercise:  walking for 60 minutes 3 times per week.  Interim History:  She had gained 11 pounds on her last in office visit on 10/02/2022. Sometimes skips last meal of day or picks up fast food ([employer], [employer]). She has also had a lot of birthday celebrations this past month.  She is doing better with avoiding cake. She reports she has also cook more at home the last few weeks.  She has had issues with getting her [name] due to the national drug shortage.  She really notices a difference in appetite and cravings when she is gone without it.  Eating all of the prescribed protein: more the last few weeks Skipping meals: Yes Drinking adequate water: no Drinking sugar sweetened beverages: Yes Hunger controlled: moderately controlled. Cravings controlled:  moderately controlled.  Assessment/Plan:  1. Type 2 Diabetes Mellitus with other specified complication, without long-term current use of insulin HgbA1c is at goal. Last A1c was 6.0. CBGs: Fasting 90-102, PP low 100s. Episodes of hypoglycemia: no Medication(s): Mounjaro 10 mg SQ weekly She has not been able to get her Mounjaro consistently.  Lab Results   Component Value Date   HGBA1C 6.0 (H) 12/20/2021   HGBA1C 6.2 (H) 03/30/2021   HGBA1C 6.0 (H) 08/09/2020   Lab Results  Component Value Date   LDLCALC 165 (H) 12/20/2021   CREATININE 0.98 04/22/2022   No results found for: "GFR"  Plan: Continue and refill Mounjaro 12.5 mg SQ weekly Will also send in refill for Mounjaro 10 mg in case the Mounjaro 12.5 mg is out of stock.   2. Hyperlipidemia associated with type 2 diabetes  LDL is not at goal. Last FLP: LDL elevated at 165, HDL and triglycerides normal. Medication(s): Lipitor 40 mg daily ASCVD 10-year risk score is 23.9%.  Lab Results  Component Value Date   CHOL 235 (H) 12/20/2021   HDL 57 12/20/2021   LDLCALC 165 (H) 12/20/2021   TRIG 77 12/20/2021   CHOLHDL 4.3 03/30/2021   CHOLHDL 3.8 08/09/2020   CHOLHDL 3.9 04/29/2020   Lab Results  Component Value Date   ALT 35 04/21/2022   AST 35 04/21/2022   ALKPHOS 118 04/21/2022   BILITOT 0.5 04/21/2022   The 10-year ASCVD risk score (Arnett DK, et al., 2019) is: 23.9%   Values used to calculate the score:     Age: 75 years     Sex: Female     Is Non-Hispanic African American: Yes     Diabetic: Yes     Tobacco smoker: No     Systolic Blood Pressure: 144 mmHg     Is BP treated: Yes     HDL Cholesterol: 57 mg/dL     Total Cholesterol: 235 mg/dL  Plan: Refill Lipitor 40  mg daily.  90-day supply requested by her insurance. Consider increasing dose or switching to another statin if next LDL is not at goal (less than 70). Check labs next in office visit.   3. Hypertension associated with type 2 diabetes Hypertension borderline controlled.  Medication(s): Losartan-HCTZ 50-12.5 mg daily.  BP Readings from Last 3 Encounters:  10/02/22 (!) 144/82  08/28/22 127/69  07/17/22 105/65   Lab Results  Component Value Date   CREATININE 0.98 04/22/2022   CREATININE 1.33 (H) 04/21/2022   CREATININE 0.87 12/20/2021   No results found for: "GFR"  Plan: Refill  losartan-HCTZ 50-12.5 mg daily. 90-day supply requested by her insurance.   4. Morbid Obesity: Current BMI 35  Kyle is currently in the action stage of change. As such, her goal is to continue with weight loss efforts.  She has agreed to the Category 2 plan.  She will work on cooking more at home.  Exercise goals:  as is  Behavioral modification strategies: increasing lean protein intake, decreasing simple carbohydrates , no meal skipping, decrease eating out, meal planning , increase water intake, and planning for success.  Matelyn has agreed to follow-up with our clinic in 4 weeks with fasting labs.  No orders of the defined types were placed in this encounter.   Medications Discontinued During This Encounter  Medication Reason   tirzepatide (MOUNJARO) 7.5 MG/0.5ML Pen    tirzepatide (MOUNJARO) 10 MG/0.5ML Pen Reorder   atorvastatin (LIPITOR) 40 MG tablet Reorder   losartan-hydrochlorothiazide (HYZAAR) 50-12.5 MG tablet Reorder   tirzepatide (MOUNJARO) 12.5 MG/0.5ML Pen Reorder   tirzepatide (MOUNJARO) 10 MG/0.5ML Pen Reorder     Meds ordered this encounter  Medications   DISCONTD: tirzepatide (MOUNJARO) 12.5 MG/0.5ML Pen    Sig: Inject 12.5 mg into the skin once a week.    Dispense:  2 mL    Refill:  0    Order Specific Question:   Supervising Provider    Answer:   Glennis Brink [[id number]]   DISCONTD: tirzepatide [name]) 10 MG/0.5ML Pen    Sig: Inject 10 mg into the skin once a week.    Dispense:  2 mL    Refill:  0    Order Specific Question:   Supervising Provider    Answer:   Glennis Brink [[id number]]   atorvastatin (LIPITOR) 40 MG tablet    Sig: Take 1 tablet (40 mg total) by mouth daily.    Dispense:  90 tablet    Refill:  0    Order Specific Question:   Supervising Provider    Answer:   Glennis Brink [[id number]]   losartan-hydrochlorothiazide (HYZAAR) 50-12.5 MG tablet    Sig: Take 1 tablet by mouth daily.    Dispense:  90 tablet    Refill:  0    Order  Specific Question:   Supervising Provider    Answer:   Glennis Brink [[id number]]   tirzepatide (MOUNJARO) 10 MG/0.5ML Pen    Sig: Inject 10 mg into the skin once a week.    Dispense:  2 mL    Refill:  1    Order Specific Question:   Supervising Provider    Answer:   Glennis Brink [[id number]]   tirzepatide (MOUNJARO) 12.5 MG/0.5ML Pen    Sig: Inject 12.5 mg into the skin once a week.    Dispense:  2 mL    Refill:  1    Order Specific Question:   Supervising Provider  Answer:   Glennis Brink [[id number]]      Objective:   VITALS: Per patient if applicable, see vitals. GENERAL: Alert and in no acute distress. CARDIOPULMONARY: No increased WOB. Speaking in clear sentences.  PSYCH: Pleasant and cooperative. Speech normal rate and rhythm. Affect is appropriate. Insight and judgement are appropriate. Attention is focused, linear, and appropriate.  NEURO: Oriented as arrived to appointment on time with no prompting.   Attestation Statements:   Reviewed by clinician on day of visit: allergies, medications, problem list, medical history, surgical history, family history, social history, and previous encounter notes.   This was prepared with the assistance of Engineer, civil (consulting).  Occasional wrong-word or sound-a-like substitutions may have occurred due to the inherent limitations of voice recognition software.

## 2022-11-02 DIAGNOSIS — R051 Acute cough: Secondary | ICD-10-CM | POA: Diagnosis not present

## 2022-11-02 DIAGNOSIS — J189 Pneumonia, unspecified organism: Secondary | ICD-10-CM | POA: Diagnosis not present

## 2022-11-02 DIAGNOSIS — I771 Stricture of artery: Secondary | ICD-10-CM | POA: Diagnosis not present

## 2022-11-02 DIAGNOSIS — R918 Other nonspecific abnormal finding of lung field: Secondary | ICD-10-CM | POA: Diagnosis not present

## 2022-11-07 ENCOUNTER — Ambulatory Visit (INDEPENDENT_AMBULATORY_CARE_PROVIDER_SITE_OTHER): Payer: BC Managed Care – PPO | Admitting: Family Medicine

## 2022-11-08 NOTE — Progress Notes (Signed)
This encounter was created in error - please disregard.

## 2022-11-22 NOTE — Telephone Encounter (Signed)
error 

## 2022-12-04 ENCOUNTER — Other Ambulatory Visit (HOSPITAL_COMMUNITY): Payer: Self-pay

## 2022-12-04 ENCOUNTER — Encounter (INDEPENDENT_AMBULATORY_CARE_PROVIDER_SITE_OTHER): Payer: Self-pay | Admitting: Family Medicine

## 2022-12-04 ENCOUNTER — Ambulatory Visit (INDEPENDENT_AMBULATORY_CARE_PROVIDER_SITE_OTHER): Payer: BC Managed Care – PPO | Admitting: Family Medicine

## 2022-12-04 VITALS — BP 111/69 | HR 62 | Temp 98.6°F | Ht 65.0 in | Wt 203.0 lb

## 2022-12-04 DIAGNOSIS — Z6833 Body mass index (BMI) 33.0-33.9, adult: Secondary | ICD-10-CM

## 2022-12-04 DIAGNOSIS — Z7985 Long-term (current) use of injectable non-insulin antidiabetic drugs: Secondary | ICD-10-CM

## 2022-12-04 DIAGNOSIS — E1169 Type 2 diabetes mellitus with other specified complication: Secondary | ICD-10-CM | POA: Diagnosis not present

## 2022-12-04 DIAGNOSIS — I10 Essential (primary) hypertension: Secondary | ICD-10-CM | POA: Diagnosis not present

## 2022-12-04 DIAGNOSIS — E669 Obesity, unspecified: Secondary | ICD-10-CM | POA: Diagnosis not present

## 2022-12-04 MED ORDER — TIRZEPATIDE 12.5 MG/0.5ML ~~LOC~~ SOAJ
12.5000 mg | SUBCUTANEOUS | 1 refills | Status: DC
  Filled 2022-12-04: qty 2, 28d supply, fill #0

## 2022-12-04 NOTE — Progress Notes (Unsigned)
Chief Complaint:   OBESITY Linda Olson is here to discuss her progress with her obesity treatment plan along with follow-up of her obesity related diagnoses. Linda Olson is on the Category 2 Plan and states she is following her eating plan approximately 70% of the time. Linda Olson states she is doing 0 minutes 0 times per week.  Today's visit was #: 29 Starting weight: 237 lbs Starting date: 04/28/2020 Today's weight: 203 lbs Today's date: 12/04/2022 Total lbs lost to date: 34 Total lbs lost since last in-office visit: 12  Interim History: Patient has done well with her weight loss. She is thinking about restarting her walking for exercise.   Subjective:   1. Type 2 diabetes mellitus with other specified complication, without long-term current use of insulin (HCC) Patient is on Mounjaro and she is working on her diet and weight loss. She has gone in between 10 mg and 12.5 mg dose, depending on what is available.   2. Essential hypertension Patient's blood pressure is well controlled with her diet and weight loss. She is on losartan-hydrochlorothiazide as well with no signs of hypotension.   Assessment/Plan:   1. Type 2 diabetes mellitus with other specified complication, without long-term current use of insulin (HCC) We will refill Mounjaro 12.5 mg once weekly for 2 months.  Patient will continue with her diet, exercise, and weight loss.  - tirzepatide (MOUNJARO) 12.5 MG/0.5ML Pen; Inject 12.5 mg into the skin once a week.  Dispense: 2 mL; Refill: 1  2. Essential hypertension Patient will continue with lifestyle modifications that we discussed today including diet and exercise, and will continue to monitor for signs of hypotension.   3. BMI 33.0-33.9,adult  4. Obesity, Beginning BMI 46.29 Linda Olson is currently in the action stage of change. As such, her goal is to continue with weight loss efforts. She has agreed to the Category 2 Plan.   Exercise goals: For substantial health  benefits, adults should do at least 150 minutes (2 hours and 30 minutes) a week of moderate-intensity, or 75 minutes (1 hour and 15 minutes) a week of vigorous-intensity aerobic physical activity, or an equivalent combination of moderate- and vigorous-intensity aerobic activity. Aerobic activity should be performed in episodes of at least 10 minutes, and preferably, it should be spread throughout the week.  Behavioral modification strategies: no skipping meals and meal planning and cooking strategies.  [name] has agreed to follow-up with our clinic in 4 weeks. She was informed of the importance of frequent follow-up visits to maximize her success with intensive lifestyle modifications for her multiple health conditions.   Objective:   Blood pressure 111/69, pulse 62, temperature 98.6 F (37 C), height 5\' 5"  (1.651 m), weight 203 lb (92.1 kg), SpO2 90%. Body mass index is 33.78 kg/m.  Lab Results  Component Value Date   CREATININE 0.98 04/22/2022   BUN 24 (H) 04/22/2022   NA 137 04/22/2022   K 4.1 04/22/2022   CL 107 04/22/2022   CO2 22 04/22/2022   Lab Results  Component Value Date   ALT 35 04/21/2022   AST 35 04/21/2022   ALKPHOS 118 04/21/2022   BILITOT 0.5 04/21/2022   Lab Results  Component Value Date   HGBA1C 6.0 (H) 12/20/2021   HGBA1C 6.2 (H) 03/30/2021   HGBA1C 6.0 (H) 08/09/2020   HGBA1C 8.2 (H) 04/29/2020   HGBA1C 6.2 (H) 03/19/2019   Lab Results  Component Value Date   INSULIN 19.5 12/20/2021   INSULIN 16.0 08/09/2020   INSULIN  22.1 04/29/2020   Lab Results  Component Value Date   TSH 2.094 04/21/2022   Lab Results  Component Value Date   CHOL 235 (H) 12/20/2021   HDL 57 12/20/2021   LDLCALC 165 (H) 12/20/2021   TRIG 77 12/20/2021   CHOLHDL 4.3 03/30/2021   Lab Results  Component Value Date   VD25OH 28.8 (L) 12/20/2021   VD25OH 40.3 03/30/2021   VD25OH 65.9 08/09/2020   Lab Results  Component Value Date   WBC 7.7 04/21/2022   HGB 12.4  04/21/2022   HCT 37.6 04/21/2022   MCV 89.1 04/21/2022   PLT 325 04/21/2022   Lab Results  Component Value Date   IRON 53 03/30/2021   TIBC 298 03/30/2021   FERRITIN 157 (H) 03/30/2021   Attestation Statements:   Reviewed by clinician on day of visit: allergies, medications, problem list, medical history, surgical history, family history, social history, and previous encounter notes.   I, Burt Knack, am acting as transcriptionist for Quillian Quince, MD.  I have reviewed the above documentation for accuracy and completeness, and I agree with the above. -  Quillian Quince, MD

## 2023-01-01 ENCOUNTER — Ambulatory Visit (INDEPENDENT_AMBULATORY_CARE_PROVIDER_SITE_OTHER): Payer: BC Managed Care – PPO | Admitting: Family Medicine

## 2023-01-01 ENCOUNTER — Encounter (INDEPENDENT_AMBULATORY_CARE_PROVIDER_SITE_OTHER): Payer: Self-pay | Admitting: Family Medicine

## 2023-01-01 VITALS — BP 121/75 | HR 65 | Temp 98.0°F | Ht 60.0 in | Wt 200.0 lb

## 2023-01-01 DIAGNOSIS — E559 Vitamin D deficiency, unspecified: Secondary | ICD-10-CM | POA: Diagnosis not present

## 2023-01-01 DIAGNOSIS — E669 Obesity, unspecified: Secondary | ICD-10-CM

## 2023-01-01 DIAGNOSIS — E782 Mixed hyperlipidemia: Secondary | ICD-10-CM

## 2023-01-01 DIAGNOSIS — E1159 Type 2 diabetes mellitus with other circulatory complications: Secondary | ICD-10-CM | POA: Diagnosis not present

## 2023-01-01 DIAGNOSIS — E1169 Type 2 diabetes mellitus with other specified complication: Secondary | ICD-10-CM

## 2023-01-01 DIAGNOSIS — I152 Hypertension secondary to endocrine disorders: Secondary | ICD-10-CM | POA: Diagnosis not present

## 2023-01-01 DIAGNOSIS — Z6839 Body mass index (BMI) 39.0-39.9, adult: Secondary | ICD-10-CM

## 2023-01-01 DIAGNOSIS — Z7985 Long-term (current) use of injectable non-insulin antidiabetic drugs: Secondary | ICD-10-CM

## 2023-01-01 MED ORDER — LOSARTAN POTASSIUM-HCTZ 50-12.5 MG PO TABS: 1.0000 | ORAL_TABLET | Freq: Every day | ORAL | 0 refills | Status: DC

## 2023-01-01 MED ORDER — TIRZEPATIDE 12.5 MG/0.5ML ~~LOC~~ SOAJ: 12.5000 mg | SUBCUTANEOUS | 1 refills | Status: DC

## 2023-01-01 NOTE — Progress Notes (Unsigned)
Chief Complaint:   OBESITY Linda Olson is here to discuss her progress with her obesity treatment plan along with follow-up of her obesity related diagnoses. Linda Olson is on the Category 2 Plan and states she is following her eating plan approximately 70% of the time. Linda Olson states she is doing 0 minutes 0 times per week.  Today's visit was #: 30 Starting weight: 237 lbs Starting date: 04/28/2020 Today's weight: 200 lbs Today's date: 01/01/2023 Total lbs lost to date: 37 Total lbs lost since last in-office visit: 3  Interim History: Patient continues to work on her diet and weight loss.  Her hunger is mostly controlled.  Subjective:   1. Type 2 diabetes mellitus with other specified complication, without long-term current use of insulin (HCC) Patient is working on her diet and weight loss.  She denies side effects with Mounjaro.  She denies abdominal pain or hypoglycemia.  2. Hypertension associated with type 2 diabetes mellitus (HCC) Patient's blood pressure is controlled with her diet and weight loss, and medications.  She denies chest pain or headache.  3. Mixed hyperlipidemia Patient is working on decreasing cholesterol in her diet.  She is due to have labs rechecked.  4. Vitamin D deficiency Patient is not on vitamin D supplementation, and her last vitamin D level was very low.  Assessment/Plan:   1. Type 2 diabetes mellitus with other specified complication, without long-term current use of insulin (HCC) We will check labs today, and we will follow-up at her next visit.  Patient will continue Mounjaro 12.5 mg once weekly, and we will refill for 2 months.  - tirzepatide (MOUNJARO) 12.5 MG/0.5ML Pen; Inject 12.5 mg into the skin once a week.  Dispense: 2 mL; Refill: 1 - CMP14+EGFR - Hemoglobin A1c - Insulin, random - Vitamin B12 - Microalbumin / creatinine urine ratio  2. Hypertension associated with type 2 diabetes mellitus (HCC) Patient will continue Hyzaar  50-12.5 mg daily, and we will refill for 90 days.  - losartan-hydrochlorothiazide (HYZAAR) 50-12.5 MG tablet; Take 1 tablet by mouth daily.  Dispense: 90 tablet; Refill: 0  3. Mixed hyperlipidemia We will check labs today.  Patient will continue with her diet, and we will follow-up at her next visit.  - Lipid Panel With LDL/HDL Ratio  4. Vitamin D deficiency We will check labs today, we will follow-up at patient's next visit.  - VITAMIN D 25 Hydroxy (Vit-D Deficiency, Fractures)  5. BMI 39.0-39.9,adult  6. Obesity, Beginning BMI 46.29 Linda Olson is currently in the action stage of change. As such, her goal is to continue with weight loss efforts. She has agreed to the Category 2 Plan.   Exercise goals: All adults should avoid inactivity. Some physical activity is better than none, and adults who participate in any amount of physical activity gain some health benefits.  Behavioral modification strategies: meal planning and cooking strategies.  Linda Olson has agreed to follow-up with our clinic in 4 to 6 weeks. She was informed of the importance of frequent follow-up visits to maximize her success with intensive lifestyle modifications for her multiple health conditions.   Linda Olson was informed we would discuss her lab results at her next visit unless there is a critical issue that needs to be addressed sooner. Linda Olson agreed to keep her next visit at the agreed upon time to discuss these results.  Objective:   Blood pressure 121/75, pulse 65, temperature 98 F (36.7 C), height 5' (1.524 m), weight 200 lb (90.7 kg), SpO2 90%. Body mass  index is 39.06 kg/m.  Lab Results  Component Value Date   CREATININE 0.98 04/22/2022   BUN 24 (H) 04/22/2022   NA 137 04/22/2022   K 4.1 04/22/2022   CL 107 04/22/2022   CO2 22 04/22/2022   Lab Results  Component Value Date   ALT 35 04/21/2022   AST 35 04/21/2022   ALKPHOS 118 04/21/2022   BILITOT 0.5 04/21/2022   Lab Results   Component Value Date   HGBA1C 6.0 (H) 12/20/2021   HGBA1C 6.2 (H) 03/30/2021   HGBA1C 6.0 (H) 08/09/2020   HGBA1C 8.2 (H) 04/29/2020   HGBA1C 6.2 (H) 03/19/2019   Lab Results  Component Value Date   INSULIN 19.5 12/20/2021   INSULIN 16.0 08/09/2020   INSULIN 22.1 04/29/2020   Lab Results  Component Value Date   TSH 2.094 04/21/2022   Lab Results  Component Value Date   CHOL 235 (H) 12/20/2021   HDL 57 12/20/2021   LDLCALC 165 (H) 12/20/2021   TRIG 77 12/20/2021   CHOLHDL 4.3 03/30/2021   Lab Results  Component Value Date   VD25OH 28.8 (L) 12/20/2021   VD25OH 40.3 03/30/2021   VD25OH 65.9 08/09/2020   Lab Results  Component Value Date   WBC 7.7 04/21/2022   HGB 12.4 04/21/2022   HCT 37.6 04/21/2022   MCV 89.1 04/21/2022   PLT 325 04/21/2022   Lab Results  Component Value Date   IRON 53 03/30/2021   TIBC 298 03/30/2021   FERRITIN 157 (H) 03/30/2021   Attestation Statements:   Reviewed by clinician on day of visit: allergies, medications, problem list, medical history, surgical history, family history, social history, and previous encounter notes.   I, Burt Knack, am acting as transcriptionist for Quillian Quince, MD.  I have reviewed the above documentation for accuracy and completeness, and I agree with the above. -  Quillian Quince, MD

## 2023-01-02 LAB — CMP14+EGFR
ALT: 36 [IU]/L — ABNORMAL HIGH (ref 0–32)
AST: 30 [IU]/L (ref 0–40)
Albumin: 4 g/dL (ref 3.8–4.9)
Alkaline Phosphatase: 123 [IU]/L — ABNORMAL HIGH (ref 44–121)
BUN/Creatinine Ratio: 19 (ref 12–28)
BUN: 15 mg/dL (ref 8–27)
Bilirubin Total: 0.8 mg/dL (ref 0.0–1.2)
CO2: 22 mmol/L (ref 20–29)
Calcium: 9.7 mg/dL (ref 8.7–10.3)
Chloride: 99 mmol/L (ref 96–106)
Creatinine, Ser: 0.81 mg/dL (ref 0.57–1.00)
Globulin, Total: 3.5 g/dL (ref 1.5–4.5)
Glucose: 75 mg/dL (ref 70–99)
Potassium: 4.1 mmol/L (ref 3.5–5.2)
Sodium: 139 mmol/L (ref 134–144)
Total Protein: 7.5 g/dL (ref 6.0–8.5)
eGFR: 83 mL/min/{1.73_m2} (ref >59)

## 2023-01-02 LAB — LIPID PANEL WITH LDL/HDL RATIO
Cholesterol, Total: 178 mg/dL (ref 100–199)
HDL: 51 mg/dL (ref >39)
LDL Chol Calc (NIH): 113 mg/dL — ABNORMAL HIGH (ref 0–99)
LDL/HDL Ratio: 2.2 ratio (ref 0.0–3.2)
Triglycerides: 77 mg/dL (ref 0–149)
VLDL Cholesterol Cal: 14 mg/dL (ref 5–40)

## 2023-01-02 LAB — MICROALBUMIN / CREATININE URINE RATIO
Creatinine, Urine: 72.2 mg/dL
Microalb/Creat Ratio: <4 mg/g{creat} (ref 0–29)
Microalbumin, Urine: <3 ug/mL

## 2023-01-02 LAB — HEMOGLOBIN A1C
Est. average glucose Bld gHb Est-mCnc: 120 mg/dL
Hgb A1c MFr Bld: 5.8 % — ABNORMAL HIGH (ref 4.8–5.6)

## 2023-01-02 LAB — VITAMIN B12: Vitamin B-12: 771 pg/mL (ref 232–1245)

## 2023-01-02 LAB — VITAMIN D 25 HYDROXY (VIT D DEFICIENCY, FRACTURES): Vit D, 25-Hydroxy: 43.6 ng/mL (ref 30.0–100.0)

## 2023-01-02 LAB — INSULIN, RANDOM: INSULIN: 12.2 u[IU]/mL (ref 2.6–24.9)

## 2023-01-03 DIAGNOSIS — H40023 Open angle with borderline findings, high risk, bilateral: Secondary | ICD-10-CM | POA: Diagnosis not present

## 2023-01-07 ENCOUNTER — Ambulatory Visit: Payer: BC Managed Care – PPO | Attending: Cardiology | Admitting: Cardiology

## 2023-01-07 ENCOUNTER — Encounter: Payer: Self-pay | Admitting: Cardiology

## 2023-01-07 VITALS — BP 130/78 | HR 66 | Ht 60.0 in | Wt 208.0 lb

## 2023-01-07 DIAGNOSIS — Z09 Encounter for follow-up examination after completed treatment for conditions other than malignant neoplasm: Secondary | ICD-10-CM

## 2023-01-07 MED ORDER — RIVAROXABAN 20 MG PO TABS: 20.0000 mg | ORAL_TABLET | Freq: Every day | ORAL | 3 refills | Status: DC

## 2023-01-07 NOTE — Progress Notes (Signed)
  Cardiology Office Note:  .   Date:  01/07/2023  ID:  Linda Olson, DOB 07/19/1960, MRN [id number] PCP: Sigmund Hazel, MD  [facility] Providers Cardiologist:  None     History of Present Illness: .   Linda Olson is a 62 y.o. female Discussed with the use of AI scribe software  History of Present Illness   The patient, a 62 year old with a history of paroxysmal atrial fibrillation, hypertension, diabetes, hyperlipidemia, and morbid obesity, presents for follow-up. The patient was hospitalized in February 2024 due to palpitations and was diagnosed with atrial fibrillation with rapid ventricular response. The patient's rhythm converted back to normal following cardism. An echocardiogram performed during the hospital stay revealed a normal ejection fraction and no valvular disease. The patient has been on Xarelto due to a CHADS-VASc score of three. The patient has also been taking low pressure 25 mg as needed and Xarelto 20 mg daily. The patient has experienced significant weight loss while on Mounjaro. The patient's ascending aorta was measured at 42 millimeters.          ROS: Rare palps. No CP  Studies Reviewed: .        Results RADIOLOGY Ascending aorta measurement: 42 mm  DIAGNOSTIC Echocardiogram: Normal ejection fraction, no valvular disease (04/2022)  A1c 5.3 Creat 0.83 Hgb 13  Outside notes reviewed. Risk Assessment/Calculations:            Physical Exam:   VS:  BP 130/78   Pulse 66   Ht 5' (1.524 m)   Wt 208 lb (94.3 kg)   SpO2 96%   BMI 40.62 kg/m    Wt Readings from Last 3 Encounters:  01/07/23 208 lb (94.3 kg)  01/01/23 200 lb (90.7 kg)  12/04/22 203 lb (92.1 kg)    GEN: Well nourished, well developed in no acute distress NECK: No JVD; No carotid bruits CARDIAC: RRR, no murmurs, no rubs, no gallops RESPIRATORY:  Clear to auscultation without rales, wheezing or rhonchi  ABDOMEN: Soft, non-tender, non-distended EXTREMITIES:  No  edema; No deformity   ASSESSMENT AND PLAN: .    Assessment and Plan    Paroxysmal Atrial Fibrillation Hospitalized in February 2024 for palpitations and found to be in atrial fibrillation with rapid ventricular response. Converted back to normal rhythm after Cardizem. Echocardiogram showed normal ejection fraction and no valvular disease. CHADS-VASc score of 3. -Continue Xarelto 20mg  daily.  Hypertension No specific details discussed in the conversation. -Continue low pressure 25mg  twice a day as needed.  Diabetes -Continue with Mounjaro, excellent hemoglobin A1c  Hyperlipidemia -Continue with atorvastatin recently refilled.  Morbid Obesity Significant weight loss noted. -Continue current weight loss strategies.  Mounjaro    General Health Maintenance -Monitor ascending aorta (current size 42mm).             Dispo: 1 yr  Signed, Donato Schultz, MD

## 2023-01-07 NOTE — Patient Instructions (Signed)

## 2023-01-14 ENCOUNTER — Emergency Department (HOSPITAL_COMMUNITY)
Admission: EM | Admit: 2023-01-14 | Discharge: 2023-01-14 | Disposition: A | Discharge status: Home / Self Care | Payer: BC Managed Care – PPO | Attending: Emergency Medicine | Admitting: Emergency Medicine

## 2023-01-14 ENCOUNTER — Other Ambulatory Visit: Payer: Self-pay

## 2023-01-14 ENCOUNTER — Telehealth: Payer: Self-pay | Admitting: Cardiology

## 2023-01-14 ENCOUNTER — Encounter (HOSPITAL_COMMUNITY): Payer: Self-pay

## 2023-01-14 DIAGNOSIS — I10 Essential (primary) hypertension: Secondary | ICD-10-CM | POA: Diagnosis not present

## 2023-01-14 DIAGNOSIS — R000 Tachycardia, unspecified: Secondary | ICD-10-CM | POA: Diagnosis not present

## 2023-01-14 DIAGNOSIS — J45909 Unspecified asthma, uncomplicated: Secondary | ICD-10-CM | POA: Insufficient documentation

## 2023-01-14 DIAGNOSIS — Z09 Encounter for follow-up examination after completed treatment for conditions other than malignant neoplasm: Secondary | ICD-10-CM | POA: Diagnosis not present

## 2023-01-14 DIAGNOSIS — R5383 Other fatigue: Secondary | ICD-10-CM | POA: Diagnosis not present

## 2023-01-14 DIAGNOSIS — I480 Paroxysmal atrial fibrillation: Secondary | ICD-10-CM

## 2023-01-14 DIAGNOSIS — I4891 Unspecified atrial fibrillation: Secondary | ICD-10-CM | POA: Diagnosis not present

## 2023-01-14 DIAGNOSIS — Z7901 Long term (current) use of anticoagulants: Secondary | ICD-10-CM | POA: Diagnosis not present

## 2023-01-14 HISTORY — DX: Unspecified atrial fibrillation: I48.91

## 2023-01-14 LAB — BASIC METABOLIC PANEL
Anion gap: 10 (ref 5–15)
BUN: 22 mg/dL — ABNORMAL HIGH (ref 6–20)
CO2: 25 mmol/L (ref 22–32)
Calcium: 9.1 mg/dL (ref 8.9–10.3)
Chloride: 104 mmol/L (ref 98–111)
Creatinine, Ser: 0.89 mg/dL (ref 0.44–1.00)
GFR, Estimated: >60 mL/min (ref >60)
Glucose, Bld: 88 mg/dL (ref 70–99)
Potassium: 4.1 mmol/L (ref 3.5–5.1)
Sodium: 139 mmol/L (ref 135–145)

## 2023-01-14 LAB — CBC WITH DIFFERENTIAL/PLATELET
Abs Immature Granulocytes: 0.01 10*3/uL (ref 0.00–0.07)
Basophils Absolute: 0 10*3/uL (ref 0.0–0.1)
Basophils Relative: 0 %
Eosinophils Absolute: 0.2 10*3/uL (ref 0.0–0.5)
Eosinophils Relative: 4 %
HCT: 39.3 % (ref 36.0–46.0)
Hemoglobin: 12.5 g/dL (ref 12.0–15.0)
Immature Granulocytes: 0 %
Lymphocytes Relative: 18 %
Lymphs Abs: 0.9 10*3/uL (ref 0.7–4.0)
MCH: 28.8 pg (ref 26.0–34.0)
MCHC: 31.8 g/dL (ref 30.0–36.0)
MCV: 90.6 fL (ref 80.0–100.0)
Monocytes Absolute: 0.5 10*3/uL (ref 0.1–1.0)
Monocytes Relative: 9 %
Neutro Abs: 3.7 10*3/uL (ref 1.7–7.7)
Neutrophils Relative %: 69 %
Platelets: 301 10*3/uL (ref 150–400)
RBC: 4.34 MIL/uL (ref 3.87–5.11)
RDW: 13.1 % (ref 11.5–15.5)
WBC: 5.3 10*3/uL (ref 4.0–10.5)
nRBC: 0 % (ref 0.0–0.2)

## 2023-01-14 LAB — MAGNESIUM: Magnesium: 2 mg/dL (ref 1.7–2.4)

## 2023-01-14 MED ORDER — SODIUM CHLORIDE 0.9 % IV BOLUS
500.0000 mL | Freq: Once | INTRAVENOUS | Status: AC
  Administered 2023-01-14: 500 mL via INTRAVENOUS

## 2023-01-14 MED ORDER — METOPROLOL TARTRATE 5 MG/5ML IV SOLN
5.0000 mg | Freq: Once | INTRAVENOUS | Status: DC
  Filled 2023-01-14: qty 5

## 2023-01-14 NOTE — ED Provider Notes (Signed)
Emergency Department Provider Note   I have reviewed the triage vital signs and the nursing notes.   HISTORY  Chief Complaint Tachycardia and Dizziness   HPI Linda Olson is a 62 y.o. female with past history of A-fib, on Xarelto presents to the emergency department with acute onset heart palpitations and fatigue.  No chest pain or shortness of breath.  She woke up this morning feeling well and went to work.  Upon arrival she developed acute onset palpitations with lightheadedness.  She took her pill in pocket metoprolol 25 mg at 9:20 AM with no improvement in symptoms and so called EMS for evaluation.  No prior history of cardioversion.  She has been compliant with her Xarelto. Followed by [facility].   Past Medical History:  Diagnosis Date   Asthma    Atrial fibrillation (HCC)    Back pain    Bell's palsy    BMI 40.0-44.9, adult (HCC)    Chest pain    Chewing difficulty    Constipation    GERD (gastroesophageal reflux disease)    Hyperlipidemia    Hypertension    Low vitamin D level    per notes from [facility]   Obesity    per notes from [facility]   Orbital floor fracture [facility])    [facility]; per notes from [facility]   Prediabetes    per notes from [facility]   Preeclampsia    per notes from [facility]    Review of Systems  Constitutional: No fever/chills Cardiovascular: Denies chest pain. Positive palpitations and lightheadedness.  Respiratory: Denies shortness of breath. Gastrointestinal: No abdominal pain.  No nausea, no vomiting.  No diarrhea.  No constipation. Genitourinary: Negative for dysuria. Musculoskeletal: Negative for back pain. Skin: Negative for rash. Neurological: Negative for headaches.  ____________________________________________   PHYSICAL EXAM:  VITAL SIGNS: Vitals:   01/14/23 1130 01/14/23 1200  BP: 119/78 103/69  Pulse: 65 75  Resp: 20   Temp:    SpO2: 96% 98%    Constitutional: Alert and  oriented. Well appearing and in no acute distress. Eyes: Conjunctivae are normal.  Head: Atraumatic. Nose: No congestion/rhinnorhea. Mouth/Throat: Mucous membranes are moist.  Neck: No stridor.   Cardiovascular: Tachycardia. Good peripheral circulation. Grossly normal heart sounds.   Respiratory: Normal respiratory effort.  No retractions. Lungs CTAB. Gastrointestinal: Soft and nontender. No distention.  Musculoskeletal: No lower extremity tenderness nor edema. No gross deformities of extremities. Neurologic:  Normal speech and language. No gross focal neurologic deficits are appreciated.  Skin:  Skin is warm, dry and intact. No rash noted.  ____________________________________________   LABS (all labs ordered are listed, but only abnormal results are displayed)  Labs Reviewed  BASIC METABOLIC PANEL - Abnormal; Notable for the following components:      Result Value   BUN 22 (*)    All other components within normal limits  CBC WITH DIFFERENTIAL/PLATELET  MAGNESIUM   ____________________________________________  EKG  Rate: 143 A fib with RVR. No STEMI.    EKG Interpretation Date/Time:  Monday January 14 2023 11:17:58 EST Ventricular Rate:  57 PR Interval:  178 QRS Duration:  76 QT Interval:  408 QTC Calculation: 398 R Axis:   59  Text Interpretation: Sinus rhythm Abnormal R-wave progression, early transition Confirmed by Alona Bene [phone]) on 01/14/2023 2:02:19 PM       ____________________________________________   PROCEDURES  Procedure(s) performed:   Procedures  CRITICAL CARE Performed by: Maia Plan Total critical  care time: 35 minutes Critical care time was exclusive of separately billable procedures and treating other patients. Critical care was necessary to treat or prevent imminent or life-threatening deterioration. Critical care was time spent personally by me on the following activities: development of treatment plan with patient and/or  surrogate as well as nursing, discussions with consultants, evaluation of patient's response to treatment, examination of patient, obtaining history from patient or surrogate, ordering and performing treatments and interventions, ordering and review of laboratory studies, ordering and review of radiographic studies, pulse oximetry and re-evaluation of patient's condition.  Alona Bene, MD Emergency Medicine  ____________________________________________   INITIAL IMPRESSION / ASSESSMENT AND PLAN / ED COURSE  Pertinent labs & imaging results that were available during my care of the patient were reviewed by me and considered in my medical decision making (see chart for details).   This patient is Presenting for Evaluation of weakness, which does require a range of treatment options, and is a complaint that involves a high risk of morbidity and mortality.  The Differential Diagnoses includes but is not exclusive to acute coronary syndrome, aortic dissection, pulmonary embolism, cardiac tamponade, community-acquired pneumonia, pericarditis, musculoskeletal chest wall pain, etc.   Critical Interventions-    Medications  metoprolol tartrate (LOPRESSOR) injection 5 mg (has no administration in time range)  sodium chloride 0.9 % bolus 500 mL (0 mLs Intravenous Stopped 01/14/23 1205)    Reassessment after intervention:  Patient spontaneously converted to NSR after IV metoprolol. BP improved.    I did obtain Additional Historical Information from EMS.   I decided to review pertinent External Data, and in summary patient on Xarelto and known history of A fib.   Clinical Laboratory Tests Ordered, included magnesium normal.  Basic metabolic without acute kidney injury.  Potassium normal.  No anemia.  Radiologic Tests: Considered CXR but no CP or SOB. Defer imaging for now.    Cardiac Monitor Tracing which shows A fib RVR.   Social Determinants of Health Risk patient is a non-smoker.    Medical Decision Making: Summary:  Patient presents emergency department with A-fib/RVR with EMS.  Has tried metoprolol prior to arrival with no improvement.  Remains in A-fib RVR on arrival.  Soft BP but awake/alert.  Patient is a candidate for ED cardioversion.   Reevaluation with update and discussion with patient at 11:02 AM. She continues to have soft BP and we were discussing ED cardioversion when she spontaneously converted to NSR. Will follow labs, observe in the ED, and reassess.   02:00 PM  Patient feeling well. Remains in NSR. Stable for discharge with Cardiology follow up planned.   Considered admission but patient is back in NSR. Labs are WNL. Stable for discharge.   Patient's presentation is most consistent with acute presentation with potential threat to life or bodily function.   Disposition: discharge  ____________________________________________  FINAL CLINICAL IMPRESSION(S) / ED DIAGNOSES  Final diagnoses:  Atrial fibrillation with RVR (HCC)    Note:  This document was prepared using Dragon voice recognition software and may include unintentional dictation errors.  Alona Bene, MD, [facility] Emergency Medicine    Miu Chiong, Arlyss Repress, MD 01/14/23 [phone]

## 2023-01-14 NOTE — Telephone Encounter (Signed)
Patient called to let Dr. Anne Fu know she has taken all her medication and is on her way to the hospital.

## 2023-01-14 NOTE — ED Triage Notes (Signed)
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 Pt has hx of a fib, took 25mg  metoprolol around 920 this morning to see if it would help. Pt still c.o feeling light headed HR 150 in triage. Pt denies chest pain or sob

## 2023-01-14 NOTE — Discharge Instructions (Signed)
You are seen in the emerged from today with fibrillation.  You came out of this rhythm in the ER and your lab work is reassuring.  Please follow-up with your cardiology doctors.  Return to the emergency department any new or suddenly worsening symptoms.

## 2023-01-14 NOTE — Telephone Encounter (Signed)
Medical Decision Making: Summary:  Patient presents emergency department with A-fib/RVR with EMS.  Has tried metoprolol prior to arrival with no improvement.  Remains in A-fib RVR on arrival.  Soft BP but awake/alert.  Patient is a candidate for ED cardioversion.    Reevaluation with update and discussion with patient at 11:02 AM. She continues to have soft BP and we were discussing ED cardioversion when she spontaneously converted to NSR. Will follow labs, observe in the ED, and reassess.    02:00 PM  Patient feeling well. Remains in NSR. Stable for discharge with Cardiology follow up planned.    Considered admission but patient is back in NSR. Labs are WNL. Stable for discharge.    Patient's presentation is most consistent with acute presentation with potential threat to life or bodily function.    Disposition: discharge  The above information is from pt's ED visit today 01/14/23.

## 2023-01-14 NOTE — ED Notes (Signed)
Patient ambulatory to restroom without any assistance.

## 2023-01-15 NOTE — Telephone Encounter (Signed)
Jake Bathe, MD  You; Alois Cliche, RN1 hour ago (6:25 AM)    Let's refer to EP for ablation discussion AFIB    Order placed for EP to discuss ablation of At Fib with RVR.  Left message for pt of EP referral ad to expect a call to be scheduled.  (OK to left message on voicemail per DPR).  Requested she call back with any questions or concerns.

## 2023-01-17 NOTE — Telephone Encounter (Signed)
Pt has been scheduled for EP eval 12/10.

## 2023-02-12 ENCOUNTER — Encounter (INDEPENDENT_AMBULATORY_CARE_PROVIDER_SITE_OTHER): Payer: Self-pay | Admitting: Physician Assistant

## 2023-02-12 ENCOUNTER — Ambulatory Visit (INDEPENDENT_AMBULATORY_CARE_PROVIDER_SITE_OTHER): Payer: BC Managed Care – PPO | Admitting: Physician Assistant

## 2023-02-12 ENCOUNTER — Ambulatory Visit: Payer: BC Managed Care – PPO | Attending: Cardiology | Admitting: Cardiology

## 2023-02-12 VITALS — BP 142/77 | HR 71 | Temp 98.3°F | Ht 60.0 in | Wt 198.0 lb

## 2023-02-12 DIAGNOSIS — Z7985 Long-term (current) use of injectable non-insulin antidiabetic drugs: Secondary | ICD-10-CM

## 2023-02-12 DIAGNOSIS — I480 Paroxysmal atrial fibrillation: Secondary | ICD-10-CM

## 2023-02-12 DIAGNOSIS — G479 Sleep disorder, unspecified: Secondary | ICD-10-CM | POA: Insufficient documentation

## 2023-02-12 DIAGNOSIS — E1169 Type 2 diabetes mellitus with other specified complication: Secondary | ICD-10-CM

## 2023-02-12 DIAGNOSIS — Z6838 Body mass index (BMI) 38.0-38.9, adult: Secondary | ICD-10-CM

## 2023-02-12 MED ORDER — TIRZEPATIDE 12.5 MG/0.5ML ~~LOC~~ SOAJ: 12.5000 mg | SUBCUTANEOUS | 1 refills | Status: DC

## 2023-02-12 NOTE — Progress Notes (Unsigned)
Electrophysiology Office Note:   Date:  02/12/2023  ID:  Linda Olson, DOB [birth year], MRN [id number]  Primary Cardiologist: None Electrophysiologist: Nobie Putnam, MD  {Click to update primary MD,subspecialty MD or APP then REFRESH:1}    History of Present Illness:   Linda Olson is a 62 y.o. female with h/o paroxysmal atrial fibrillation, hypertension, diabetes, hyperlipidemia who is being seen today for evaluation of her atrial fibrillation at the request of Dr. Anne Fu.   The patient presented to the ED hospital with a chief complaint of palpitations in February 2024 and was diagnosed with atrial fibrillation with rapid ventricular response.  The patient was given Cardizem and spontaneously converted to sinus rhythm.  She was placed on metoprolol for rate control and started on Xarelto.  She presented to the ED again on 01/14/2023 with palpitations and lightheadedness.  She had taken pill in the pocket metoprolol with no improvement in her symptoms.  EKG showed atrial fibrillation.  Patient spontaneously converted to normal sinus rhythm while in the ED.  Review of systems complete and found to be negative unless listed in HPI.   EP Information / Studies Reviewed:    EKG is not ordered today. EKG from 01/14/23 reviewed which showed sinus bradycardia.       EKG 04/21/22: Atrial fibrillation.   Echo 04/16/22:  Normal LV size and function.  LVEF 60 to 65%. Normal RV size and function. No significant valvular disease. Aneurysm of the ascending aorta, measuring 42 mm. Left atrium and right atrium are normal in size.  Coronary CTA 03/19/19:  IMPRESSION: 1. 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 Calcium score 104 involving LAD which is 95 th percentile for age and sex 59. No obstructive CAD noted poor visualization of D2 and distal RCA/Circumflex due to patient body habitus  4. Normal FFR CT  Risk Assessment/Calculations:    CHA2DS2-VASc  Score = 3  {Confirm score is correct.  If not, click here to update score.  REFRESH note.  :1} This indicates a 3.2% annual risk of stroke. The patient's score is based upon: CHF History: 0 HTN History: 1 Diabetes History: 1 Stroke History: 0 Vascular Disease History: 0 Age Score: 0 Gender Score: 1   {This patient has a significant risk of stroke if diagnosed with atrial fibrillation.  Please consider VKA or DOAC agent for anticoagulation if the bleeding risk is acceptable.   You can also use the SmartPhrase .[email or web address] for documentation.   :[id number]} No BP recorded.  {Refresh Note OR Click here to enter BP  :1}***        Physical Exam:   VS:  There were no vitals taken for this visit.   Wt Readings from Last 3 Encounters:  01/07/23 208 lb (94.3 kg)  01/01/23 200 lb (90.7 kg)  12/04/22 203 lb (92.1 kg)     GEN: Well nourished, well developed in no acute distress NECK: No JVD; No carotid bruits CARDIAC: {EPRHYTHM:28826}, no murmurs, rubs, gallops RESPIRATORY:  Clear to auscultation without rales, wheezing or rhonchi  ABDOMEN: Soft, non-tender, non-distended EXTREMITIES:  No edema; No deformity   ASSESSMENT AND PLAN:   Linda Olson is a 62 y.o. female with h/o paroxysmal atrial fibrillation, hypertension, diabetes, hyperlipidemia who is being seen today for evaluation of her atrial fibrillation at the request of Dr. Anne Fu.   #.  Paroxysmal atrial fibrillation, symptomatic:  #. Secondary hypercoagulable state due to atrial fibrillation: - CHADSVASc score of 3. -Continue Xarelto.  #.  Hypertension -*** goal today.  Recommend checking blood pressures 1-2 times per week at home and recording the values.  Recommend bringing these recordings to the primary care physician.  #. Obesity:  Follow up with {GNFAO:13086} {EPFOLLOW VH:84696}  Signed, Nobie Putnam, MD

## 2023-02-12 NOTE — Progress Notes (Signed)
SUBJECTIVE:  Chief Complaint: Obesity  Interim History: She is down 2 lbs since her last visit.  Down 39 lbs TBW loss of 16.5% Linda Olson, a 62 year old individual with a history of obesity, type 2 diabetes, hypertension, mixed hyperlipidemia, vitamin D deficiency, and atrial fibrillation, presents for a follow-up visit regarding her obesity treatment plan. She is currently on Mounjaro for type 2 diabetes and Xarelto for atrial fibrillation. She recently visited the ER due to fatigue associated with atrial fibrillation, which spontaneously converted to normal sinus rhythm during the ER visit.  The patient reports not strictly adhering to the prescribed diet plan, particularly during dinner time. She attributes this to recent celebrations, including her mother's 80th birthday, and frequent dining out. Despite this, she has lost two pounds since the last visit. She reports ensuring adequate protein intake during meals.  The patient also reports a lack of physical activity, which she attributes to the holiday season. She expresses a desire to return to her previous routine of walking and weight lifting. Despite the lack of physical activity, the patient has gained a bit of muscle mass and lost some adipose tissue.  The patient has been experiencing leg cramps and severe headaches, particularly upon waking up in the middle of the night. We discussed possible sleep apnea  as waking almost daily with headaches, as she often wakes up around 3 or 4 am. She has not previously been evaluated for sleep apnea.  The patient is also experiencing issues with her current medication, Mounjaro. She reports having to take the medication whenever she receives it, sometimes resulting in missed weeks. She expresses a desire for a three-month supply to avoid this issue. She is also on Lipitor and losartan, for which she has a three-month supply.  Media is here to discuss her progress with her obesity  treatment plan. She is on the Category 2 Plan and states she is following her eating plan approximately 0 % of the time. She reports not following the plan, but then on review of eating patterns, she is focusing on protein intake and taking to make smart choices overall, but also indulging more than usual.   She states she is not exercising 0 minutes 0 times per week.  Pharmacotherapy: Mounjaro 12.5 mg weekly. Denies mass in neck, dysphagia, dyspepsia, persistent hoarseness, abdominal pain, or N/V/Constipation or diarrhea. Has annual eye exam. Mood is stable.    OBJECTIVE: Visit Diagnoses: Problem List Items Addressed This Visit     Paroxysmal atrial fibrillation with RVR (HCC)   Obesity, Beginning BMI 46.29   Relevant Medications   tirzepatide (MOUNJARO) 12.5 MG/0.5ML Pen   Sleep disorder   Relevant Orders   Ambulatory referral to Neurology   Diabetes mellitus (HCC) - Primary (Chronic)   Relevant Medications   tirzepatide (MOUNJARO) 12.5 MG/0.5ML Pen   Other Visit Diagnoses     BMI 38.0-38.9,adult Current BMI 38.8         Obesity Inconsistent adherence to obesity treatment plan, particularly during dinner and social events. Despite this, lost 2 pounds and reduced adipose tissue from 93.2 pounds to 89.2 pounds. Muscle mass has increased slightly. Current adipose percentage is 44.9%, with a target of 35% or less. Discussed the importance of maintaining protein intake and moderate exercise. Advised to be mindful of diet during holidays and social events, aiming for 80% adherence to the plan. - Continue Mounjaro 12.5 mg weekly - Encourage adherence to dietary plan, especially during social events - Recommend pre-party protein intake (e.g.,  Fairlife milk, low-fat cheese) - Encourage resumption of physical activity, including walking and weight training - Follow up in 4 weeks  Type 2 Diabetes Mellitus On Mounjaro 12.5 mg weekly for management.Denies mass in neck, dysphagia, dyspepsia,  persistent hoarseness, abdominal pain, or N/V/Constipation or diarrhea. Has annual eye exam. Mood is stable.   Continue/refill Mounjaro 12.5 mg weekly She is working  on nutrition plan to decrease simple carbohydrates, increase lean proteins and exercise to promote weight loss and improve glycemic control .   Atrial Fibrillation Managed with Xarelto. Recent ER visit for AFib with rapid ventricular rate, spontaneously converted to normal sinus rhythm. Cardiology follow-up scheduled for today. HR today in 70's.  - Continue Xarelto - Attend cardiology follow-up with Dr. Michele Rockers today at 10:45 AM  Hypertension Managed with losartan-hydrochlorothiazide 50-12.5 mg daily (and metoprolol 25 mg BID if HR> 100- for PAF). No new issues reported.BP generally in goal range. Up a little today. To see cardiology in follow up for PAF episode as noted.  - Continue losartan-hydrochlorothiazide daily.  - Monitor blood pressure regularly  Mixed Hyperlipidemia Managed with Lipitor. No new issues reported. - Continue Lipitor - Monitor lipid levels regularly  Vitamin D Deficiency No new issues reported. On OTC vitamin D.  - Continue current vitamin D supplementation  Possible Sleep Apnea Reports early morning headaches and disrupted sleep, which may indicate sleep apnea. No prior evaluation for sleep apnea. Discussed potential need for sleep study. - Refer to neurology for sleep apnea evaluation- Saw Dr. Lucia Gaskins in past for Bell's Palsy.  - Consider sleep study for further evaluation  General Health Maintenance Discussed general health maintenance strategies, including protein intake and physical activity. Advised on over-the-counter Tylenol for headaches. - Encourage balanced diet with adequate protein - Encourage regular physical activity - Advise on over-the-counter Tylenol for headaches  Follow-up - Follow up in 4 weeks with either Dr. Dalbert Garnet or current provider - Schedule appointment for  December 9th at 2:30 PM.  Vitals Temp: 98.3 F (36.8 C) BP: (!) 142/77 Pulse Rate: 71 SpO2: 98 %   Anthropometric Measurements Height: 5' (1.524 m) Weight: 198 lb (89.8 kg) BMI (Calculated): 38.67 Weight at Last Visit: 200lb Weight Lost Since Last Visit: 2lb Weight Gained Since Last Visit: 0 Starting Weight: 237lb Total Weight Loss (lbs): 39 lb (17.7 kg)   Body Composition  Body Fat %: 44.9 % Fat Mass (lbs): 89.2 lbs Muscle Mass (lbs): 103.8 lbs Total Body Water (lbs): 75.6 lbs Visceral Fat Rating : 14   Other Clinical Data Fasting: yes Labs: no Today's Visit #: 32 Starting Date: 04/28/20     ASSESSMENT AND PLAN:  Diet: Maileen is currently in the action stage of change. As such, her goal is to continue with weight loss efforts. She has agreed to Category 2 Plan.  Exercise: Christalyn has been instructed that some exercise is better than none for weight loss and overall health benefits.   Behavior Modification:  We discussed the following Behavioral Modification Strategies today: increasing lean protein intake, decreasing simple carbohydrates, increasing vegetables, increase H2O intake, increase high fiber foods, no skipping meals, holiday eating strategies, and planning for success. We discussed various medication options to help Joy with her weight loss efforts and we both agreed to continue Mounjaro 12.5 mg weekly for Type 2 diabetes..  Return in about 4 weeks (around 03/12/2023).Marland Kitchen She was informed of the importance of frequent follow up visits to maximize her success with intensive lifestyle modifications for her multiple health conditions.  Attestation Statements:   Reviewed by clinician on day of visit: allergies, medications, problem list, medical history, surgical history, family history, social history, and previous encounter notes.   Time spent on visit including pre-visit chart review and post-visit care and charting was 38 minutes.    Raylene Carmickle, PA-C

## 2023-02-13 ENCOUNTER — Encounter: Payer: Self-pay | Admitting: Cardiology

## 2023-03-14 ENCOUNTER — Ambulatory Visit (INDEPENDENT_AMBULATORY_CARE_PROVIDER_SITE_OTHER): Payer: BC Managed Care – PPO | Admitting: Physician Assistant

## 2023-03-14 ENCOUNTER — Encounter (INDEPENDENT_AMBULATORY_CARE_PROVIDER_SITE_OTHER): Payer: Self-pay | Admitting: Physician Assistant

## 2023-03-14 VITALS — BP 120/73 | HR 68 | Temp 98.3°F | Ht 60.0 in | Wt 197.0 lb

## 2023-03-14 DIAGNOSIS — E1159 Type 2 diabetes mellitus with other circulatory complications: Secondary | ICD-10-CM

## 2023-03-14 DIAGNOSIS — Z7985 Long-term (current) use of injectable non-insulin antidiabetic drugs: Secondary | ICD-10-CM

## 2023-03-14 DIAGNOSIS — I480 Paroxysmal atrial fibrillation: Secondary | ICD-10-CM

## 2023-03-14 DIAGNOSIS — E669 Obesity, unspecified: Secondary | ICD-10-CM

## 2023-03-14 DIAGNOSIS — I152 Hypertension secondary to endocrine disorders: Secondary | ICD-10-CM | POA: Diagnosis not present

## 2023-03-14 DIAGNOSIS — E1169 Type 2 diabetes mellitus with other specified complication: Secondary | ICD-10-CM

## 2023-03-14 DIAGNOSIS — Z6838 Body mass index (BMI) 38.0-38.9, adult: Secondary | ICD-10-CM

## 2023-03-14 MED ORDER — TIRZEPATIDE 12.5 MG/0.5ML ~~LOC~~ SOAJ: 12.5000 mg | SUBCUTANEOUS | 1 refills | Status: DC

## 2023-03-14 NOTE — Progress Notes (Signed)
 SUBJECTIVE: Discussed the use of AI scribe software for clinical note transcription with the patient, who gave verbal consent to proceed.  Chief Complaint: Obesity  Interim History: She is down 1 lb since her last visit.  Down 40 lbs overall TBW loss of 16.9% Linda Olson, a 63 year old individual with a history of obesity, type 2 diabetes, vitamin D  deficiency, hypertension, and paroxysmal atrial fibrillation, presents for a follow-up visit regarding her obesity treatment plan. She has been managing her diabetes with weekly doses of Mounjaro  12.5 mg and her atrial fibrillation with Xarelto . Her hypertension is managed with daily doses of losartan  hydrochlorothiazide  50/12.5 mg and metoprolol  25 mg BID if her heart rate exceeds 100.  The patient reports no recent emergency room visits and has an upcoming appointment with a cardiologist for a second opinion on her cardiac condition. She has experienced occasional nosebleeds, which she has managed without medical intervention. She has been using a cool mist vaporizer in her room to combat the dryness caused by indoor heating.  Despite not strictly adhering to her diet plan or exercising during the holiday season, the patient has lost a pound. She has resumed her category two diet plan and has started walking and lifting weights at work. She has had a 40-pound weight loss overall, which has resulted in a decrease in her blood pressure and heart rate. She has only needed to take metoprolol  twice for her heart rate > 100 over the past couple of months.  The patient has been experiencing elevated stress levels due to personal issues at home but expects these to decrease once she receives answers to her concerns. She has been taking Mounjaro  without any side effects such as nausea, vomiting, diarrhea, or constipation. She has been increasing her fiber intake and water consumption to maintain regular bowel movements. She has also been mindful of her  eating habits, focusing on consuming meats and vegetables and limiting her intake of sweets.  Linda Olson is here to discuss her progress with her obesity treatment plan. She is on the Category 2 Plan and states she is not following her eating plan approximately  % of the time. She states she is not exercising 0 minutes 0 times per week.   OBJECTIVE: Visit Diagnoses: Problem List Items Addressed This Visit     Hypertension associated with type 2 diabetes mellitus (HCC)   Relevant Medications   tirzepatide  (MOUNJARO ) 12.5 MG/0.5ML Pen   Paroxysmal atrial fibrillation with RVR (HCC)   Obesity, Beginning BMI 46.29   Relevant Medications   tirzepatide  (MOUNJARO ) 12.5 MG/0.5ML Pen   Diabetes mellitus (HCC) - Primary (Chronic)   Relevant Medications   tirzepatide  (MOUNJARO ) 12.5 MG/0.5ML Pen   Other Visit Diagnoses       BMI 38.0-38.9,adult Current BMI 38.6          Obesity Down one pound since last visit despite holiday diet and exercise lapses. Resumed weight management plan focusing on healthier eating and regular exercise. Total weight loss of forty pounds. Discussed the importance of removing unhealthy foods and maintaining regular exercise including walking and weight lifting. - Encourage continuation of current weight management plan - Advise cleaning out pantry of unhealthy foods - Recommend regular exercise including walking and weight lifting - Schedule follow-up appointment in one month    Type 2 Diabetes Mellitus Lab Results  Component Value Date   HGBA1C 5.8 (H) 01/01/2023   HGBA1C 6.0 (H) 12/20/2021   HGBA1C 6.2 (H) 03/30/2021   Lab Results  Component  Value Date   LDLCALC 113 (H) 01/01/2023   CREATININE 0.89 01/14/2023    On Mounjaro  12.5 mg weekly with no reported issues. Managing diet and increasing fiber intake to address occasional constipation, a known side effect. Discussed the importance of listening to hunger cues to avoid overeating. - Refill Mounjaro   12.5 mg weekly prescription - Monitor bowel movements and increase fiber and water intake She is working  on nutrition plan to decrease simple carbohydrates, increase lean proteins and exercise to promote weight loss and improve glycemic control .  Hypertension Blood pressure well-controlled at 120/73 mmHg on losartan  hydrochlorothiazide  50/12.5 mg daily. No recent need for metoprolol  as heart rate is stable. Discussed the importance of regular monitoring and adherence to medication regimen. - Continue current antihypertensive regimen - Monitor blood pressure regularly Continue to work on nutrition plan to promote weight loss and improve BP control.   Paroxysmal Atrial Fibrillation Chronically on Xarelto . Follow-up with cardiologist scheduled. No recent episodes requiring metoprolol . Discussed the importance of regular cardiology follow-up. - Continue Xarelto  - Follow up with cardiologist as scheduled   General Health Maintenance Managing diet, exercise, and medication adherence effectively. Discussed the importance of regular physical activity and healthy eating habits. - Encourage regular physical activity - Promote healthy eating habits  Follow-up - Schedule follow-up appointment for February 12th at 8 AM - Ensure fasting if labs are needed.  Vitals Temp: 98.3 F (36.8 C) BP: 120/73 Pulse Rate: 68 SpO2: 99 %   Anthropometric Measurements Height: 5' (1.524 m) Weight: 197 lb (89.4 kg) BMI (Calculated): 38.47 Weight at Last Visit: 198 lb Weight Lost Since Last Visit: 1 lb Weight Gained Since Last Visit: 0 Starting Weight: 237 lb Total Weight Loss (lbs): 40 lb (18.1 kg)   Body Composition  Body Fat %: 45.5 % Fat Mass (lbs): 90 lbs Muscle Mass (lbs): 102.2 lbs Total Body Water (lbs): 76.2 lbs Visceral Fat Rating : 15   Other Clinical Data Fasting: no Labs: no Today's Visit #: 33 Starting Date: 04/28/20     ASSESSMENT AND PLAN:  Diet: Linda Olson is  currently in the action stage of change. As such, her goal is to continue with weight loss efforts. She has agreed to Category 2 Plan.  Exercise: Linda Olson has been instructed to work up to a goal of 150 minutes of combined cardio and strengthening exercise per week for weight loss and overall health benefits.   Behavior Modification:  We discussed the following Behavioral Modification Strategies today: increasing lean protein intake, decreasing simple carbohydrates, increasing vegetables, increase H2O intake, increase high fiber foods, no skipping meals, better snacking choices, avoiding temptations, and planning for success. We discussed various medication options to help Brighten with her weight loss efforts and we both agreed to continue Mounjaro  12.5 mg weekly for Type 2 diabetes management.  Return in about 4 weeks (around 04/11/2023) for Fasting Lab possibly.. She was informed of the importance of frequent follow up visits to maximize her success with intensive lifestyle modifications for her multiple health conditions.  Attestation Statements:   Reviewed by clinician on day of visit: allergies, medications, problem list, medical history, surgical history, family history, social history, and previous encounter notes.   Time spent on visit including pre-visit chart review and post-visit care and charting was 31 minutes.    Clora Ohmer, PA-C

## 2023-03-18 ENCOUNTER — Encounter: Payer: Self-pay | Admitting: Cardiovascular Disease

## 2023-03-18 ENCOUNTER — Telehealth: Payer: Self-pay | Admitting: *Deleted

## 2023-03-18 ENCOUNTER — Ambulatory Visit: Payer: BC Managed Care – PPO | Attending: Cardiovascular Disease | Admitting: Cardiovascular Disease

## 2023-03-18 VITALS — BP 110/68 | HR 62 | Ht 60.0 in | Wt 207.8 lb

## 2023-03-18 DIAGNOSIS — I480 Paroxysmal atrial fibrillation: Secondary | ICD-10-CM

## 2023-03-18 NOTE — Telephone Encounter (Signed)
 DR. NANCEY ORDERED ITAMAR STUDY.   Patient agreement reviewed and signed on 03/18/2023.  WatchPAT issued to patient on 03/18/2023 by Niels Jest, CMA. Patient aware to not open the WatchPAT box until contacted with the activation PIN. Patient profile initialized in CloudPAT on 03/18/2023 by NIELS JEST, CMA. Device serial number: [id number]  Please list Reason for Call as Advice Only and type WatchPAT issued to patient in the comment box.

## 2023-03-18 NOTE — Progress Notes (Signed)
 Electrophysiology Office Note:    Date:  03/18/2023   ID:  Linda Olson, DOB 1960-08-21, MRN [id number]  PCP:  Cleotilde Planas, MD   [facility] Providers Cardiologist:  None Electrophysiologist:  Fonda Kitty, MD     Referring MD: Cleotilde Planas, MD   History of Present Illness:    Linda Olson is a 63 y.o. female with a medical history significant for atrial fibrillation, anticoagulation with Xarelto , hypertension, hyperlipidemia obesity referred for management of atrial fibrillation.     She was hospitalized in February 2024 due to palpitations and diagnosed with atrial fibrillation with RVR.  She converted back to sinus rhythm after receiving diltiazem  echo showed normal EF.  She had recurrence of atrial fibrillation in November 2024.  She has experienced weight loss on Mounjaro   I discussed the use of AI scribe software for clinical note transcription with the patient, who gave verbal consent to proceed.  The patient, with a history of atrial fibrillation, presents for a consultation with a cardiologist. They report multiple episodes of atrial fibrillation, with the first episode occurring in November 2023. The patient has been to the ER several times due to this condition. They describe an episode where they were about to be shocked out of the condition, but it resolved on its own. The patient has been taking metoprolol  to manage their condition, but only takes it when they can't control the atrial fibrillation. They also report feeling tired during the day, but attribute this to staying up late. The patient has not been tested for sleep apnea, but the doctor suggests this as a possible contributing factor to their condition.     Today, she reports that she is doing well. No palpitations, pre-syncope.  EKGs/Labs/Other Studies Reviewed Today:     Echocardiogram:  TTE April 16, 2022 EF 60 to 65%.  Aneurysm of the ascending aorta noted, 42 mm normal sized  atria   Monitors:   Stress testing:   Advanced imaging:  CT coronary Calcium  score 104, involving the LAD.  95th percentile for age  Cardiac catherization    EKG:   EKG Interpretation Date/Time:  Monday March 18 2023 09:17:21 EST Ventricular Rate:  62 PR Interval:  164 QRS Duration:  82 QT Interval:  370 QTC Calculation: 375 R Axis:   38  Text Interpretation: Normal sinus rhythm Normal ECG When compared with ECG of 14-Jan-2023 11:17, No significant change was found Confirmed by Nancey Scotts [phone]) on 03/18/2023 9:29:20 AM     Physical Exam:    VS:  BP 110/68   Pulse 62   Ht 5' (1.524 m)   Wt 207 lb 12.8 oz (94.3 kg)   SpO2 98%   BMI 40.58 kg/m     Wt Readings from Last 3 Encounters:  03/18/23 207 lb 12.8 oz (94.3 kg)  03/14/23 197 lb (89.4 kg)  02/12/23 198 lb (89.8 kg)     GEN: Well nourished, well developed in no acute distress CARDIAC: RRR, no murmurs, rubs, gallops RESPIRATORY:  Normal work of breathing MUSCULOSKELETAL: no edema    ASSESSMENT & PLAN:     Paroxysmal Atrial fibrillation Episodes in February 2024 in November 2024 With daytime sleepiness, uncertainty whether she snores, we will order a sleep apnea test We discussed options for rhythm management.  I recommended ablation and described the procedure to her in detail.  She would like to proceed with scheduling  We discussed the indication, rationale, logistics, anticipated benefits, and potential risks of the  ablation procedure including but not limited to -- bleed at the groin access site, chest pain, damage to nearby organs such as the diaphragm, lungs, or esophagus, need for a drainage tube, or prolonged hospitalization. I explained that the risk for stroke, heart attack, need for open chest surgery, or even death is very low but not zero. she  expressed understanding and wishes to proceed.   Secondary hypercoagulable state CHA2DS2-VASc score is 3 Continue Xarelto  20 mg  daily  Hypertension BP controlled today  Morbid obesity We discussed the importance of weight loss for maintenance of sinus rhythm Continue Mounjaro   Diabetes Well-controlled Continue Mounjaro     Signed, Eulas FORBES Furbish, MD  03/18/2023 9:29 AM    [facility]

## 2023-03-18 NOTE — Patient Instructions (Signed)
 Medication Instructions:  Your physician recommends that you continue on your current medications as directed. Please refer to the Current Medication list given to you today. *If you need a refill on your cardiac medications before your next appointment, please call your pharmacy*   Lab Work: CBC and BMET on Monday, March 10 - this can be completed at ANY [facility], no appointment required and this does not have to be fasting If you have labs (blood work) drawn today and your tests are completely normal, you will receive your results only by: MyChart Message (if you have MyChart) OR A paper copy in the mail If you have any lab test that is abnormal or we need to change your treatment, we will call you to review the results.   Testing/Procedures: Atrial Fibrillation Ablation  Your physician has recommended that you have an ablation. Catheter ablation is a medical procedure used to treat some cardiac arrhythmias (irregular heartbeats). During catheter ablation, a long, thin, flexible tube is put into a blood vessel in your groin (upper thigh), or neck. This tube is called an ablation catheter. It is then guided to your heart through the blood vessel. Radio frequency waves destroy small areas of heart tissue where abnormal heartbeats may cause an arrhythmia to start. Please see the instruction sheet given to you today.  You are scheduled for Atrial Fibrillation Ablation on Monday, April 7 with Dr. Deirdre Furbish.Please arrive at the Main Entrance A at [facility]: [address] [place], [place] [address]  [place] Sleep Study Your physician has recommended that you have a sleep study. This test records several body functions during sleep, including: brain activity, eye movement, oxygen and carbon dioxide blood levels, heart rate and rhythm, breathing rate and rhythm, the flow of air through your mouth and nose, snoring, body muscle movements, and chest and belly movement.   Follow-Up: At [facility], you and your health needs are our priority.  As part of our continuing mission to provide you with exceptional heart care, we have created designated Provider Care Teams.  These Care Teams include your primary Cardiologist (physician) and Advanced Practice Providers (APPs -  Physician Assistants and Nurse Practitioners) who all work together to provide you with the care you need, when you need it.  We recommend signing up for the patient portal called MyChart.  Sign up information is provided on this After Visit Summary.  MyChart is used to connect with patients for Virtual Visits (Telemedicine).  Patients are able to view lab/test results, encounter notes, upcoming appointments, etc.  Non-urgent messages can be sent to your provider as well.   To learn more about what you can do with MyChart, go to [email or web address].    Your next appointment:   We will schedule follow up after your ablation   Provider:   Eulas Furbish, MD

## 2023-04-02 ENCOUNTER — Other Ambulatory Visit (INDEPENDENT_AMBULATORY_CARE_PROVIDER_SITE_OTHER): Payer: Self-pay | Admitting: Family Medicine

## 2023-04-02 DIAGNOSIS — M25561 Pain in right knee: Secondary | ICD-10-CM | POA: Diagnosis not present

## 2023-04-02 DIAGNOSIS — E1159 Type 2 diabetes mellitus with other circulatory complications: Secondary | ICD-10-CM

## 2023-04-02 DIAGNOSIS — S838X1A Sprain of other specified parts of right knee, initial encounter: Secondary | ICD-10-CM | POA: Diagnosis not present

## 2023-04-09 ENCOUNTER — Telehealth (INDEPENDENT_AMBULATORY_CARE_PROVIDER_SITE_OTHER): Payer: Self-pay | Admitting: *Deleted

## 2023-04-09 ENCOUNTER — Other Ambulatory Visit (INDEPENDENT_AMBULATORY_CARE_PROVIDER_SITE_OTHER): Payer: Self-pay | Admitting: Physician Assistant

## 2023-04-09 DIAGNOSIS — E1169 Type 2 diabetes mellitus with other specified complication: Secondary | ICD-10-CM

## 2023-04-09 MED ORDER — TIRZEPATIDE 12.5 MG/0.5ML ~~LOC~~ SOAJ
12.5000 mg | SUBCUTANEOUS | 1 refills | Status: DC
  Filled 2023-04-09: qty 2, 28d supply, fill #0

## 2023-04-09 NOTE — Telephone Encounter (Signed)
-----   Message from [facility] sent at 04/09/2023  7:13 AM EST ----- Regarding: RE: Med price increase--new phatmacy Can someone call the patient today and find out which [facility] and you can also let her know she can [facility] to her desired pharmacy herself as well.  Thanks, Shawn ----- Message ----- From: Vicci Jernigan D Sent: 04/08/2023   3:44 PM EST To: Elouise Phlegm Rayburn, PA-C; # Subject: Med price increase--new phatmacy               Good afternoon!  Linda Olson called and [facility] She would like for it to be sent to [facility] instead. She took her last dose yesterday (02/02) and would like to avoid missing a dose.

## 2023-04-09 NOTE — Telephone Encounter (Signed)
-----   Message from [facility] sent at 04/09/2023  7:13 AM EST ----- Regarding: RE: Med price increase--new phatmacy Can someone call the patient today and find out which [facility] and you can also let her know she can [facility] prescription to her desired pharmacy herself as well.  Thanks, Shawn ----- Message ----- From: Vicci Jernigan D Sent: 04/08/2023   3:44 PM EST To: Elouise Phlegm Rayburn, PA-C; # Subject: Med price increase--new phatmacy               Good afternoon!  Airyana called and said [facility] She would like for it to be sent to [facility] instead. She took her last dose yesterday (02/02) and would like to avoid missing a dose.

## 2023-04-09 NOTE — Telephone Encounter (Signed)
Called patient and left voice message to call back with the correct [facility] pharmacy she would like to use.

## 2023-04-09 NOTE — Telephone Encounter (Signed)
Called patient again and got the correct pharmacy ([facility])which is located on [address],

## 2023-04-10 ENCOUNTER — Other Ambulatory Visit (HOSPITAL_COMMUNITY): Payer: Self-pay

## 2023-04-11 DIAGNOSIS — R252 Cramp and spasm: Secondary | ICD-10-CM | POA: Diagnosis not present

## 2023-04-11 DIAGNOSIS — R7303 Prediabetes: Secondary | ICD-10-CM | POA: Diagnosis not present

## 2023-04-11 DIAGNOSIS — E785 Hyperlipidemia, unspecified: Secondary | ICD-10-CM | POA: Diagnosis not present

## 2023-04-11 DIAGNOSIS — M7051 Other bursitis of knee, right knee: Secondary | ICD-10-CM | POA: Diagnosis not present

## 2023-04-17 ENCOUNTER — Other Ambulatory Visit (HOSPITAL_COMMUNITY): Payer: Self-pay

## 2023-04-17 ENCOUNTER — Ambulatory Visit (INDEPENDENT_AMBULATORY_CARE_PROVIDER_SITE_OTHER): Payer: BC Managed Care – PPO | Admitting: Physician Assistant

## 2023-04-17 ENCOUNTER — Encounter (INDEPENDENT_AMBULATORY_CARE_PROVIDER_SITE_OTHER): Payer: Self-pay | Admitting: Physician Assistant

## 2023-04-17 VITALS — BP 134/81 | HR 83 | Temp 98.3°F | Ht 60.0 in | Wt 203.0 lb

## 2023-04-17 DIAGNOSIS — E1159 Type 2 diabetes mellitus with other circulatory complications: Secondary | ICD-10-CM | POA: Diagnosis not present

## 2023-04-17 DIAGNOSIS — Z7985 Long-term (current) use of injectable non-insulin antidiabetic drugs: Secondary | ICD-10-CM

## 2023-04-17 DIAGNOSIS — Z6839 Body mass index (BMI) 39.0-39.9, adult: Secondary | ICD-10-CM

## 2023-04-17 DIAGNOSIS — I152 Hypertension secondary to endocrine disorders: Secondary | ICD-10-CM | POA: Diagnosis not present

## 2023-04-17 DIAGNOSIS — E1169 Type 2 diabetes mellitus with other specified complication: Secondary | ICD-10-CM

## 2023-04-17 DIAGNOSIS — I480 Paroxysmal atrial fibrillation: Secondary | ICD-10-CM

## 2023-04-17 MED ORDER — TIRZEPATIDE 12.5 MG/0.5ML ~~LOC~~ SOAJ
12.5000 mg | SUBCUTANEOUS | 1 refills | Status: DC
  Filled 2023-04-17: qty 2, 28d supply, fill #0

## 2023-04-17 NOTE — Progress Notes (Signed)
SUBJECTIVE: Discussed the use of AI scribe software for clinical note transcription with the patient, who gave verbal consent to proceed.  Chief Complaint: Obesity  Interim History: She is up 6 lbs since her last visit.  She has cardiac ablation procedure scheduled for 06/10/23 for A fib.   Linda Olson is here to discuss her progress with her obesity treatment plan. She is on the Category 2 Plan and states she is following her eating plan approximately 70 % of the time. She states she is not exercising 0 minutes 0 times per week.  Linda Olson is a 63 year old female with obesity who presents for follow-up of her obesity treatment plan.  She is currently on Mounjaro 12.5 mg weekly for Type 2 diabetes management and obesity management. She is experiencing difficulty affording the medication.  She is concerned about potentially missing a dose if the issue is not resolved soon. She feels the [facility] helps with appetite suppression, but sometimes causing her to forget to eat meals.We discussed the importance of regular meals and adequate protein intake for weight loss and overall health.   She has a history of paroxysmal atrial fibrillation and is planning to undergo an ablation procedure in April. She is on chronic anticoagulation with Xarelto and takes Lopressor 25 mg up to twice daily as needed for heart rates greater than 100. She experiences occasional palpitations and lightheadedness, which she manages by sitting down and calming herself. These symptoms are less frequent but can be triggered by rushing or stress. Currently she has no chest pain, no SOB, no palpitations or light headedness.   She has a history of type 2 diabetes and hypertension. Her current medications include Lipitor 40 mg daily and losartan hydrochlorothiazide 50/12.5 mg once daily. She reports no need for a refill of losartan hydrochlorothiazide as she has a three-month supply. She is also on over-the-counter  cholecalciferol for vitamin D deficiency.  She experiences leg cramps and swelling, particularly in one leg, which required a brace due to severe swelling and pain. She visited [place] walk-in clinic where a meniscus injury was considered but not confirmed. She is awaiting physical therapy for this issue.   In terms of her nutrition plan, she is mindful of sodium intake and is trying to balance her meals. She enjoys beef jerky but is cautious about its sodium content. She typically has yogurt and an apple for breakfast and varies her lunch and dinner, sometimes including pasta salad or nachos. She is trying to avoid fast food and is considering using an air fryer for healthier meal preparation.  OBJECTIVE: Visit Diagnoses: Problem List Items Addressed This Visit     Hypertension associated with type 2 diabetes mellitus (HCC)   Relevant Medications   tirzepatide (MOUNJARO) 12.5 MG/0.5ML Pen   Paroxysmal atrial fibrillation with RVR (HCC)   Obesity, Beginning BMI 46.29   Relevant Medications   tirzepatide (MOUNJARO) 12.5 MG/0.5ML Pen   Diabetes mellitus (HCC) - Primary (Chronic)   Relevant Medications   tirzepatide (MOUNJARO) 12.5 MG/0.5ML Pen   Other Visit Diagnoses       BMI 39.0-39.9,adult Current BMI 39.7         Obesity Linda Olson a 63 year old female, is on Mounjaro 12.5 mg weekly for obesity. She reports difficulty affording the medication and is awaiting a response from the pharmacy regarding a more affordable option. Dietary challenges include occasional high-calorie and high-sodium foods. She has been advised on portion control and sodium intake, particularly with beef jerky,  and to use an air fryer for healthier cooking options. - Send prescription for [facility] to [facility] pharmacy - Advise on portion control and sodium intake, particularly with beef jerky - Recommend using an air fryer for healthier cooking options - Schedule follow-up appointment in mid-March  Paroxysmal  Atrial Fibrillation Linda Olson is scheduled for an ablation procedure in April with Dr. York Pellant .She experiences occasional dizziness and lightheadedness, managed by resting. She is on chronic anticoagulation with Xarelto.  She has been informed to avoid strenuous activities for seven days post-procedure. - Continue Xarelto and usual medications as directed by Cardiology - Ensure blood thinner prescription is filled - Monitor symptoms and rest as needed - Follow up with cardiologist as scheduled  Hypertension Linda Olson is on losartan hydrochlorothiazide 50/12.5 mg once daily for hypertension. She reports no issues with her current medication regimen. - Continue losartan hydrochlorothiazide 50/12.5 mg once daily Continue to work on nutrition plan to promote weight loss and improve BP control.  No symptoms of hypotension. Continue to monitor closely on Mounjaro.  - Monitor blood pressure regularly  Type 2 Diabetes Mellitus Linda Olson is managing type 2 diabetes with lifestyle modifications and medication. She is on Mounjaro 12.5 mg weekly. Denies mass in neck, dysphagia, dyspepsia, persistent hoarseness, abdominal pain, or N/V/Constipation or diarrhea. Has annual eye exam. Mood is stable.  Lab Results  Component Value Date   HGBA1C 5.8 (H) 01/01/2023   HGBA1C 6.0 (H) 12/20/2021   HGBA1C 6.2 (H) 03/30/2021   Lab Results  Component Value Date   LDLCALC 113 (H) 01/01/2023   CREATININE 0.89 01/14/2023   Continue/refill Mounjaro 12.5 mg weekly She is working  on nutrition plan to decrease simple carbohydrates, increase lean proteins and exercise to promote weight loss and improve glycemic control .   Vitamin D Deficiency Linda Olson is on over-the-counter cholecalciferol once daily for vitamin D deficiency. - Continue cholecalciferol once daily  Follow-up - Schedule follow-up appointment in mid-March.  Vitals Temp: 98.3 F (36.8 C) BP: 134/81 Pulse Rate: 83 SpO2: 96  %   Anthropometric Measurements Height: 5' (1.524 m) Weight: 203 lb (92.1 kg) BMI (Calculated): 39.65 Weight at Last Visit: 197 lb Weight Lost Since Last Visit: 0 Weight Gained Since Last Visit: 6 lb Starting Weight: 237 lb Total Weight Loss (lbs): 34 lb (15.4 kg)   Body Composition  Body Fat %: 47.1 % Fat Mass (lbs): 95.6 lbs Muscle Mass (lbs): 102 lbs Total Body Water (lbs): 77.8 lbs Visceral Fat Rating : 15   Other Clinical Data Fasting: yes Labs: no Today's Visit #: 34 Starting Date: 04/28/20     ASSESSMENT AND PLAN:  Diet: Linda Olson is currently in the action stage of change. As such, her goal is to continue with weight loss efforts. She has agreed to Category 2 Plan.  Exercise: Linda Olson has been instructed  exercise if able per cardiac issues  for weight loss and overall health benefits.   Behavior Modification:  We discussed the following Behavioral Modification Strategies today: increasing lean protein intake, decreasing simple carbohydrates, increasing vegetables, increase H2O intake, increase high fiber foods, no skipping meals, meal planning and cooking strategies, better snacking choices, avoiding temptations, and planning for success. We discussed various medication options to help Linda Olson with her weight loss efforts and we both agreed to continue Mounjaro 12.5 mg weekly for Type 2 diabetes and continue to work on nutritional and behavioral strategies to promote weight loss.  .  Return in about 4 weeks (around 05/15/2023).Marland Kitchen She was informed  of the importance of frequent follow up visits to maximize her success with intensive lifestyle modifications for her multiple health conditions.  Attestation Statements:   Reviewed by clinician on day of visit: allergies, medications, problem list, medical history, surgical history, family history, social history, and previous encounter notes.   Time spent on visit including pre-visit chart review and post-visit  care and charting was 36 minutes.    Linda Botting, PA-C

## 2023-04-18 ENCOUNTER — Telehealth: Payer: Self-pay

## 2023-04-18 NOTE — Telephone Encounter (Signed)
Spoke with patient, moved ablation up to 3/31 (couldn't use 3/14 or 3/19 due to scheduling with work). Labs to be completed the first week of March, CT scheduled on 3/12. No needs at this time

## 2023-04-24 ENCOUNTER — Other Ambulatory Visit (INDEPENDENT_AMBULATORY_CARE_PROVIDER_SITE_OTHER): Payer: Self-pay | Admitting: Family Medicine

## 2023-04-24 DIAGNOSIS — I152 Hypertension secondary to endocrine disorders: Secondary | ICD-10-CM

## 2023-05-03 ENCOUNTER — Telehealth (HOSPITAL_COMMUNITY): Payer: Self-pay

## 2023-05-03 NOTE — Telephone Encounter (Signed)
 Attempted to reach patient to discuss upcoming procedure, no answer. Left VM for patient to return call.

## 2023-05-06 DIAGNOSIS — I480 Paroxysmal atrial fibrillation: Secondary | ICD-10-CM | POA: Diagnosis not present

## 2023-05-06 NOTE — Telephone Encounter (Signed)
 Spoke with patient to complete one month pre-procedure call.     New medical conditions?  No Recent hospitalizations or surgeries? No Started any new medications? No Patient made aware to contact office to inform of any new medications started. Any changes in activities of daily living? No  Pre-procedure testing scheduled: CT on 05/15/23 and lab work ordered Confirmed patient is taking Xarelto and will continue taking medication before procedure or it may need to be rescheduled.  Confirmed patient is scheduled for  Atrial  Flutter Ablation  on Monday, March 31 with Dr. York Pellant. Instructed patient to arrive at the Main Entrance A at [facility]: [address] [place], [place] [address] and check in at Admitting at 10:30 AM  Advised of plan to go home the same day and will only stay overnight if medically necessary. You MUST have a responsible adult to drive you home and MUST be with you the first 24 hours after you arrive home or your procedure could be cancelled.  Patient verbalized understanding to information provided and is agreeable to proceed with procedure.

## 2023-05-07 ENCOUNTER — Encounter: Payer: Self-pay | Admitting: Cardiovascular Disease

## 2023-05-07 DIAGNOSIS — H35033 Hypertensive retinopathy, bilateral: Secondary | ICD-10-CM | POA: Diagnosis not present

## 2023-05-07 LAB — CBC
Hematocrit: 39.7 % (ref 34.0–46.6)
Hemoglobin: 13 g/dL (ref 11.1–15.9)
MCH: 29.6 pg (ref 26.6–33.0)
MCHC: 32.7 g/dL (ref 31.5–35.7)
MCV: 90 fL (ref 79–97)
Platelets: 298 10*3/uL (ref 150–450)
RBC: 4.39 x10E6/uL (ref 3.77–5.28)
RDW: 11.9 % (ref 11.7–15.4)
WBC: 5.2 10*3/uL (ref 3.4–10.8)

## 2023-05-07 LAB — BASIC METABOLIC PANEL
BUN/Creatinine Ratio: 16 (ref 12–28)
BUN: 16 mg/dL (ref 8–27)
CO2: 23 mmol/L (ref 20–29)
Calcium: 9.7 mg/dL (ref 8.7–10.3)
Chloride: 100 mmol/L (ref 96–106)
Creatinine, Ser: 1.01 mg/dL — ABNORMAL HIGH (ref 0.57–1.00)
Glucose: 85 mg/dL (ref 70–99)
Potassium: 4.1 mmol/L (ref 3.5–5.2)
Sodium: 138 mmol/L (ref 134–144)
eGFR: 64 mL/min/{1.73_m2} (ref >59)

## 2023-05-07 LAB — HM DIABETES EYE EXAM

## 2023-05-13 ENCOUNTER — Other Ambulatory Visit: Payer: Self-pay

## 2023-05-13 ENCOUNTER — Telehealth: Payer: Self-pay | Admitting: Cardiovascular Disease

## 2023-05-13 ENCOUNTER — Other Ambulatory Visit (HOSPITAL_COMMUNITY): Payer: Self-pay

## 2023-05-13 ENCOUNTER — Telehealth: Payer: Self-pay | Admitting: Cardiology

## 2023-05-13 DIAGNOSIS — M25561 Pain in right knee: Secondary | ICD-10-CM | POA: Diagnosis not present

## 2023-05-13 MED ORDER — RIVAROXABAN 20 MG PO TABS
20.0000 mg | ORAL_TABLET | Freq: Every day | ORAL | 3 refills | Status: DC
  Filled 2023-05-13: qty 30, 30d supply, fill #0

## 2023-05-13 MED ORDER — RIVAROXABAN 20 MG PO TABS: 20.0000 mg | ORAL_TABLET | Freq: Every day | ORAL | Status: DC

## 2023-05-13 MED ORDER — RIVAROXABAN 20 MG PO TABS: 20.0000 mg | ORAL_TABLET | Freq: Every day | ORAL | 0 refills | Status: DC

## 2023-05-13 MED ORDER — RIVAROXABAN 20 MG PO TABS: 20.0000 mg | ORAL_TABLET | Freq: Every day | ORAL | 3 refills | Status: DC

## 2023-05-13 NOTE — Telephone Encounter (Signed)
 Prescription refill request for Xarelto received.  Indication:afib Last office visit:1/25 Weight:92.1  kg Age:63 Scr:1.01  3/25 CrCl:86.12  ml/min  Prescription refilled

## 2023-05-13 NOTE — Telephone Encounter (Signed)
 Sigurd Sos, RN  You; Margaret Pyle D, CMA1 hour ago (1:58 PM)    She qualifies for Xarelto 20 mg Samples.  Thank you    Pt given #14 samples of Xarelto 20 mg and pt assistance information.

## 2023-05-13 NOTE — Telephone Encounter (Signed)
°*  STAT* If patient is at the pharmacy, call can be transferred to refill team.   1. Which medications need to be refilled? (please list name of each medication and dose if known)rivaroxaban (XARELTO) 20 MG TABS tablet  2. Which pharmacy/location (including street and city if local pharmacy) is medication to be sent to? [id number] [place], [place] - [address] AT [place]  3. Do they need a 30 day or 90 day supply? [place]

## 2023-05-13 NOTE — Telephone Encounter (Signed)
 Pt c/o medication issue:  1. Name of Medication:   rivaroxaban (XARELTO) 20 MG TABS tablet    2. How are you currently taking this medication (dosage and times per day)? As written   3. Are you having a reaction (difficulty breathing--STAT)? No   4. What is your medication issue? [facility] til [facility]. Pt is out of medication. Please advise.

## 2023-05-13 NOTE — Telephone Encounter (Signed)
Patient is following up regarding samples request.

## 2023-05-14 ENCOUNTER — Other Ambulatory Visit (HOSPITAL_COMMUNITY): Payer: Self-pay

## 2023-05-14 ENCOUNTER — Telehealth: Payer: Self-pay | Admitting: Pharmacy Technician

## 2023-05-14 NOTE — Telephone Encounter (Signed)
 I called and spoke to the patient because I ran a test claim and it came up 579.92 for 30 days (it's not too soon). I went to the site to get her a coupon since she is commercial ins and it says she already has a coupon. I gave her the number for her to call to see about her coupon. I called the pharmacy to see if they already had the coupon info and to see why they were getting a too soon claim. [facility] and they said they do not have a coupon on file for her. Pt aware she needs to give [facility] the coupon info once she receives it. She has a high copay. She is commercial ins so does not qualify for other patient assistance other than the coupon.

## 2023-05-14 NOTE — Telephone Encounter (Signed)
 I called and spoke to the patient because I ran a test claim and it came up 579.92 for 30 days (it's not too soon). I went to the site to get her a coupon since she is commercial ins and it says she already has a coupon. I gave her the number for her to call to see about her coupon. I called the pharmacy to see if they already had the coupon info and to see why they were getting a too soon claim

## 2023-05-15 ENCOUNTER — Other Ambulatory Visit (HOSPITAL_COMMUNITY): Payer: Self-pay

## 2023-05-15 ENCOUNTER — Ambulatory Visit (HOSPITAL_COMMUNITY)
Admission: RE | Admit: 2023-05-15 | Discharge: 2023-05-15 | Disposition: A | Discharge status: Home / Self Care | Payer: BC Managed Care – PPO | Source: Ambulatory Visit | Attending: Cardiovascular Disease | Admitting: Cardiovascular Disease

## 2023-05-15 ENCOUNTER — Encounter (INDEPENDENT_AMBULATORY_CARE_PROVIDER_SITE_OTHER): Payer: Self-pay | Admitting: Physician Assistant

## 2023-05-15 ENCOUNTER — Ambulatory Visit (INDEPENDENT_AMBULATORY_CARE_PROVIDER_SITE_OTHER): Payer: BC Managed Care – PPO | Admitting: Physician Assistant

## 2023-05-15 VITALS — BP 91/60 | HR 54 | Temp 98.0°F | Ht 60.0 in | Wt 197.0 lb

## 2023-05-15 DIAGNOSIS — Z7985 Long-term (current) use of injectable non-insulin antidiabetic drugs: Secondary | ICD-10-CM

## 2023-05-15 DIAGNOSIS — E1159 Type 2 diabetes mellitus with other circulatory complications: Secondary | ICD-10-CM | POA: Diagnosis not present

## 2023-05-15 DIAGNOSIS — I480 Paroxysmal atrial fibrillation: Secondary | ICD-10-CM

## 2023-05-15 DIAGNOSIS — R5383 Other fatigue: Secondary | ICD-10-CM | POA: Diagnosis not present

## 2023-05-15 DIAGNOSIS — I152 Hypertension secondary to endocrine disorders: Secondary | ICD-10-CM

## 2023-05-15 DIAGNOSIS — E785 Hyperlipidemia, unspecified: Secondary | ICD-10-CM

## 2023-05-15 DIAGNOSIS — Z6838 Body mass index (BMI) 38.0-38.9, adult: Secondary | ICD-10-CM

## 2023-05-15 DIAGNOSIS — E559 Vitamin D deficiency, unspecified: Secondary | ICD-10-CM | POA: Diagnosis not present

## 2023-05-15 DIAGNOSIS — E1169 Type 2 diabetes mellitus with other specified complication: Secondary | ICD-10-CM

## 2023-05-15 DIAGNOSIS — E669 Obesity, unspecified: Secondary | ICD-10-CM

## 2023-05-15 MED ORDER — IOHEXOL 350 MG/ML SOLN
95.0000 mL | Freq: Once | INTRAVENOUS | Status: AC | PRN
  Administered 2023-05-15: 95 mL via INTRAVENOUS

## 2023-05-15 MED ORDER — TIRZEPATIDE 12.5 MG/0.5ML ~~LOC~~ SOAJ
12.5000 mg | SUBCUTANEOUS | 1 refills | Status: DC
  Filled 2023-05-15: qty 2, 28d supply, fill #0
  Filled 2023-06-27: qty 2, 28d supply, fill #1

## 2023-05-15 NOTE — Progress Notes (Unsigned)
 SUBJECTIVE: Discussed the use of AI scribe software for clinical note transcription with the patient, who gave verbal consent to proceed.  Chief Complaint: Obesity  Interim History: She is down 6 lbs since her last visit.  40 lbs down overall TBW loss of 16.9%  Muscle mass -0.2 lbs Adipose mass -5 lbs !  Linda Olson is here to discuss her progress with her obesity treatment plan. She is on the Category 2 Plan and states she is following her eating plan approximately 70 % of the time. She states she is not exercising 0 minutes 0 times per week.  The patient is a 63 year old with obesity who presents for follow-up of her obesity treatment plan.  She is on Mounjaro 12.5 mg weekly for the last two months but reports inadequate appetite suppression, sometimes feeling hungry despite following the recommended protein intake. The cost of [name] has increased from $25 to $54 per month, which she finds manageable as long as it is sourced from [employer].  She has a history of paroxysmal atrial fibrillation and is scheduled for an ablation procedure on March 31st. She recently underwent a CT scan in preparation for the procedure. She is currently taking Xarelto 20 mg daily and metoprolol 25 mg as needed for heart rate control. She is concerned about the cost of Xarelto, which her insurance does not fully cover, leading to a potential change in her anticoagulation therapy.  She has type 2 diabetes with a last recorded A1c of 5.8. She is unsure of her current A1c level and mentions recent blood work done for pre-procedure evaluation. She is on a type 2 diabetes diet plan and is concerned about whether it is sufficient.  She also has a history of hypertension and vitamin D deficiency. Her current medications include Lipitor 40 mg daily, losartan hydrochlorothiazide 50/12.5 mg daily, and vitamin D3 once daily. She takes vitamin E but is unsure of its necessity.  She reports feeling tired and having low  energy levels, often falling asleep while watching TV. She has not been engaging in regular exercise due to her heart condition but mentions walking during shopping trips. She plans to start knee exercises.  Fasting IC next visit and patient advised to arrive at 7:30 am for IC testing OBJECTIVE: Visit Diagnoses: Problem List Items Addressed This Visit     Hypertension associated with type 2 diabetes mellitus (HCC)   Relevant Medications   tirzepatide (MOUNJARO) 12.5 MG/0.5ML Pen   Paroxysmal atrial fibrillation with RVR (HCC)   Vitamin D deficiency   Relevant Orders   VITAMIN D 25 Hydroxy (Vit-D Deficiency, Fractures) (Completed)   Obesity, Beginning BMI 46.29   Relevant Medications   tirzepatide (MOUNJARO) 12.5 MG/0.5ML Pen   Diabetes mellitus (HCC) - Primary (Chronic)   Relevant Medications   tirzepatide (MOUNJARO) 12.5 MG/0.5ML Pen   Other Relevant Orders   Hemoglobin A1c (Completed)   Insulin, random (Completed)   CMP14+EGFR (Completed)   Other Visit Diagnoses       Hyperlipidemia associated with type 2 diabetes mellitus (HCC)       Relevant Medications   tirzepatide (MOUNJARO) 12.5 MG/0.5ML Pen   Other Relevant Orders   Lipid Panel With LDL/HDL Ratio (Completed)     Other fatigue       Relevant Orders   TSH (Completed)   Vitamin B12 (Completed)     BMI 38.0-38.9,adult Current BMI 38.6         Obesity On Mounjaro 12.5 mg weekly for Type  2 diabetes management with obesity.  Reports inadequate appetite suppression and persistent hunger.  40-pound weight loss. Discussed increasing Mounjaro dose without additional cost.  Emphasized maintaining muscle mass and noted 5-pound adipose tissue loss over the past few weeks. - Continue Mounjaro 12.5 mg weekly. - Plan fasting IC-metabolism test on April 14th as has lost 40 lbs but continues to have very low activity level. May need adjustment to meal plan, but also experiencing increased hunger/cravings.    Atrial  Fibrillation Scheduled for an ablation procedure on March 31st to address arrhythmia. She is concerned about needing anticoagulation/cost of anticoagulation long term if continues to require.   Currently on Xarelto 20 mg daily for anticoagulation, but cost is a concern due to insurance issues. - Discuss alternative anticoagulation options with cardiologist due to Xarelto cost concerns. Follow with cardiology and continue [facility] which should promote further weight loss/decrease CV risks further along with current treatment plan.  Cardiac CTA 05/15/23- For A fib scheduled for Ablation  IMPRESSION: 1. There is normal pulmonary vein drainage into the left atrium with ostial measurements above.  2. There is no thrombus in the left atrial appendage.  3. The esophagus runs in the right atrial midline and is in proximity to the right inferior pulmonary vein ostium.  4. No PFO/ASD.  5. Normal coronary origin. Right dominance.  6. CAC score of 186 which is 95th percentile for age-, race-, and sex-matched controls. [name], MD    Type 2 Diabetes Mellitus Last A1c was 5.8, but it has been a while since the last test. She is working  on nutrition plan to decrease simple carbohydrates, increase lean proteins and exercise to promote weight loss and improve glycemic control . A1c at goal.  Mounjaro 12.5 mg weekly. Denies mass in neck, dysphagia, dyspepsia, persistent hoarseness, abdominal pain, or N/V/Constipation or diarrhea. Has annual eye exam. Mood is stable.   On ARB ON Statin Regular eye exams.  Lab Results  Component Value Date   HGBA1C 5.8 (H) 05/15/2023   HGBA1C 5.8 (H) 01/01/2023   HGBA1C 6.0 (H) 12/20/2021   Lab Results  Component Value Date   LDLCALC 112 (H) 05/15/2023   CREATININE 0.90 05/15/2023   Continue/refill Mounjaro 12.5 mg weekly Continue working  on nutrition plan to decrease simple carbohydrates, increase lean proteins and exercise to promote weight loss and  improve glycemic control . Recheck A1c /Insulin and CMET today.  Meds ordered this encounter  Medications   tirzepatide (MOUNJARO) 12.5 MG/0.5ML Pen    Sig: Inject 12.5 mg into the skin once a week.    Dispense:  2 mL    Refill:  1     Hypertension Hypertension improved, asymptomatic, reasonably well controlled, and no significant medication side effects noted.  Medication(s): Losartan-hydrochlorothiazide 50-12.5 mg once daily   Metoprolol 25 mg BID prn for HR> 100  BP Readings from Last 3 Encounters:  05/15/23 134/69  05/15/23 91/60  04/17/23 134/81   Lab Results  Component Value Date   CREATININE 0.90 05/15/2023   CREATININE 1.01 (H) 05/06/2023   CREATININE 0.89 01/14/2023   No results found for: "GFR"  Plan: Continue all antihypertensives at current dosages. Continue to work on nutrition plan to promote weight loss and improve BP control.  Recheck labs today.   Vitamin D Deficiency Vitamin D is not at goal of 50.  Most recent vitamin D level was 43.6 She is on OTC vitamin D3 2000 IU daily.? Dosage. No N/V or muscle weakness with  Lab Results  Component Value Date   VD25OH 49.8 05/15/2023   VD25OH 43.6 01/01/2023   VD25OH 28.8 (L) 12/20/2021    Plan: Continue OTC vitamin D3 2000 IU daily Low vitamin D levels can be associated with adiposity and may result in leptin resistance and weight gain. Also associated with fatigue.  Currently on vitamin D supplementation without any adverse effects such as nausea, vomiting or muscle weakness.  Recheck vitamin D level today.    Hyperlipidemia LDL is not at goal. Medication(s): Lipitor 40 mg daily. No side effect.  And Mounjaro.  Cardiovascular risk factors: diabetes mellitus, dyslipidemia, family history of premature cardiovascular disease, hypertension, microalbuminuria, obesity (BMI >= 30 kg/m2), and sedentary lifestyle  Lab Results  Component Value Date   CHOL 183 05/15/2023   HDL 59 05/15/2023   LDLCALC 112 (H)  05/15/2023   TRIG 63 05/15/2023   CHOLHDL 4.3 03/30/2021   CHOLHDL 3.8 08/09/2020   CHOLHDL 3.9 04/29/2020   Lab Results  Component Value Date   ALT 23 05/15/2023   AST 24 05/15/2023   ALKPHOS 115 05/15/2023   BILITOT 0.9 05/15/2023   The 10-year ASCVD risk score (Arnett DK, et al., 2019) is: 5.3%   Values used to calculate the score:     Age: 58 years     Sex: Female     Is Non-Hispanic African American: Yes     Diabetic: Yes     Tobacco smoker: No     Systolic Blood Pressure: 91 mmHg     Is BP treated: Yes     HDL Cholesterol: 59 mg/dL     Total Cholesterol: 183 mg/dL  Plan: Continue statin. Continue Mounjaro . Continue to work on nutrition plan -decreasing simple carbohydrates, increasing lean proteins, decreasing saturated fats and cholesterol , avoiding trans fats and exercise as able to promote weight loss, improve lipids and decrease cardiovascular risks. Recheck fasting lipids today.   Fatigue Endorses fatigue Plan: Check B12, TSH and CBC in addition to other labs today and address accordingly.   Follow-up Requires follow-up after heart procedure and to assess metabolism and weight management plan. - Schedule follow-up on April 14th at 8 AM, with metabolism test at 7:30 AM.  Vitals Temp: 98 F (36.7 C) BP: 91/60 Pulse Rate: (!) 54 SpO2: 99 %   Anthropometric Measurements Height: 5' (1.524 m) Weight: 197 lb (89.4 kg) BMI (Calculated): 38.47 Weight at Last Visit: 203 lb Weight Lost Since Last Visit: 6 lb Weight Gained Since Last Visit: 0 Starting Weight: 237 lb Total Weight Loss (lbs): 40 lb (18.1 kg) Peak Weight: 260 lb   Body Composition  Body Fat %: 45.8 % Fat Mass (lbs): 90.6 lbs Muscle Mass (lbs): 101.8 lbs Total Body Water (lbs): 77.4 lbs Visceral Fat Rating : 14   Other Clinical Data Fasting: yes Labs: no Today's Visit #: 35 Starting Date: 04/28/20     ASSESSMENT AND PLAN:  Diet: Tymara is currently in the action stage of  change. As such, her goal is to continue with weight loss efforts and has agreed to the Category 2 Plan.   Exercise:  All adults should avoid inactivity. Some activity is better than none, and adults who participate in any amount of physical activity, gain some health benefits.  Behavior Modification:  We discussed the following Behavioral Modification Strategies today: increasing lean protein intake, decreasing simple carbohydrates, increasing vegetables, increase H2O intake, increase high fiber foods, no skipping meals, emotional eating strategies , avoiding temptations, and planning  for success. We discussed various medication options to help Maraki with her weight loss efforts and we both agreed to continue Mounjaro 12.5 mg weekly for Type 2 diabetes and continue to work on nutritional and behavioral strategies to promote weight loss.  .  Return in about 5 weeks (around 06/19/2023) for Fasting IC.Marland Kitchen She was informed of the importance of frequent follow up visits to maximize her success with intensive lifestyle modifications for her multiple health conditions.  Attestation Statements:   Reviewed by clinician on day of visit: allergies, medications, problem list, medical history, surgical history, family history, social history, and previous encounter notes.   Time spent on visit including pre-visit chart review and post-visit care and charting was 32 minutes  Undine Nealis,PA-C

## 2023-05-16 ENCOUNTER — Encounter: Payer: Self-pay | Admitting: Cardiovascular Disease

## 2023-05-16 LAB — VITAMIN D 25 HYDROXY (VIT D DEFICIENCY, FRACTURES): Vit D, 25-Hydroxy: 49.8 ng/mL (ref 30.0–100.0)

## 2023-05-16 LAB — INSULIN, RANDOM: INSULIN: 4.4 u[IU]/mL (ref 2.6–24.9)

## 2023-05-16 LAB — CMP14+EGFR
ALT: 23 IU/L (ref 0–32)
AST: 24 IU/L (ref 0–40)
Albumin: 4.1 g/dL (ref 3.8–4.9)
Alkaline Phosphatase: 115 IU/L (ref 44–121)
BUN/Creatinine Ratio: 12 (ref 12–28)
BUN: 11 mg/dL (ref 8–27)
Bilirubin Total: 0.9 mg/dL (ref 0.0–1.2)
CO2: 24 mmol/L (ref 20–29)
Calcium: 9.9 mg/dL (ref 8.7–10.3)
Chloride: 100 mmol/L (ref 96–106)
Creatinine, Ser: 0.9 mg/dL (ref 0.57–1.00)
Globulin, Total: 3.2 g/dL (ref 1.5–4.5)
Glucose: 67 mg/dL — ABNORMAL LOW (ref 70–99)
Potassium: 4.5 mmol/L (ref 3.5–5.2)
Sodium: 139 mmol/L (ref 134–144)
Total Protein: 7.3 g/dL (ref 6.0–8.5)
eGFR: 73 mL/min/{1.73_m2} (ref >59)

## 2023-05-16 LAB — VITAMIN B12: Vitamin B-12: 855 pg/mL (ref 232–1245)

## 2023-05-16 LAB — LIPID PANEL WITH LDL/HDL RATIO
Cholesterol, Total: 183 mg/dL (ref 100–199)
HDL: 59 mg/dL (ref >39)
LDL Chol Calc (NIH): 112 mg/dL — ABNORMAL HIGH (ref 0–99)
LDL/HDL Ratio: 1.9 ratio (ref 0.0–3.2)
Triglycerides: 63 mg/dL (ref 0–149)
VLDL Cholesterol Cal: 12 mg/dL (ref 5–40)

## 2023-05-16 LAB — HEMOGLOBIN A1C
Est. average glucose Bld gHb Est-mCnc: 120 mg/dL
Hgb A1c MFr Bld: 5.8 % — ABNORMAL HIGH (ref 4.8–5.6)

## 2023-05-16 LAB — TSH: TSH: 0.944 u[IU]/mL (ref 0.450–4.500)

## 2023-05-26 NOTE — Pre-Procedure Instructions (Signed)
 Attempted to call patient regarding procedure scheduled on 06/03/23 with anesthesia.  Anesthesia requires Mounjaro to be held for 7 days.  The last day you can take it would be today, until after procedure.

## 2023-05-27 ENCOUNTER — Telehealth (HOSPITAL_COMMUNITY): Payer: Self-pay

## 2023-05-27 NOTE — Telephone Encounter (Signed)
 Attempted to reach patient to discuss upcoming procedure, no answer. Left VM for patient to return call.

## 2023-05-28 ENCOUNTER — Telehealth: Payer: Self-pay

## 2023-05-28 NOTE — Telephone Encounter (Signed)
**Note De-Identified Vollie Aaron Obfuscation** Ordering provider: Dr Nelly Laurence Associated diagnoses: Snoring-R06.83, Somnolence-R40.0  WatchPAT PA obtained on 05/28/2023 by Kimon Loewen, Lorelle Formosa, LPN. Authorization: Per [employer] provider portal: Order ID: [id number] Authorized Approval Valid Through:05/28/2023 - 07/26/2023  Patient notified of PIN (1234) on 05/28/2023 Ahmiya Abee Notification Method: phone.  Phone note routed to covering staff for follow-up.

## 2023-05-28 NOTE — Telephone Encounter (Signed)
 Call placed to patient to discuss upcoming procedure.   CT: completed.  Labs: completed.   Any recent signs of acute illness or been started on antibiotics? No Any new medications started? No Any medications to hold?  Mounjaro 7 days- last dose 05/20/23 Any missed doses of blood thinner?  No Advised patient to continue taking ANTICOAGULANT: Xarelto (Rivaroxaban) without missing any doses.  Medication instructions:  On the morning of your procedure DO NOT take any medication., including Xarelto or the procedure may be rescheduled. Nothing to eat or drink after midnight prior to your procedure.  Confirmed patient is scheduled for Atrial Fibrillation Ablation on Monday, March 31 with Dr. York Pellant. Instructed patient to arrive at the Main Entrance A at [facility]: [address] [place], [place] [address] and check in at Admitting at 10:30 AM.  Advised of plan to go home the same day and will only stay overnight if medically necessary. You MUST have a responsible adult to drive you home and MUST be with you the first 24 hours after you arrive home or your procedure could be cancelled.  Patient verbalized understanding to all instructions provided and agreed to proceed with procedure.

## 2023-05-31 NOTE — Pre-Procedure Instructions (Signed)
 Instructed patient on the following items: Arrival time 1000 Nothing to eat or drink after midnight No meds AM of procedure Responsible person to drive you home and stay with you for 24 hrs  Have you missed any doses of anti-coagulant Xarelto- takes once a day, hasn't missed any doses in last 4 weeks.  Don't take dose morning of procedure.  Patient away to hold [facility] for 7 days before procedure.

## 2023-06-03 ENCOUNTER — Encounter (HOSPITAL_COMMUNITY): Payer: Self-pay | Admitting: Cardiovascular Disease

## 2023-06-03 ENCOUNTER — Ambulatory Visit (HOSPITAL_COMMUNITY)

## 2023-06-03 ENCOUNTER — Ambulatory Visit (HOSPITAL_COMMUNITY)
Admission: RE | Admit: 2023-06-03 | Discharge: 2023-06-03 | Disposition: A | Discharge status: Home / Self Care | Payer: BC Managed Care – PPO | Attending: Cardiovascular Disease | Admitting: Cardiovascular Disease

## 2023-06-03 ENCOUNTER — Encounter (HOSPITAL_COMMUNITY)
Admission: RE | Disposition: A | Discharge status: Home / Self Care | Payer: Self-pay | Source: Home / Self Care | Attending: Cardiovascular Disease

## 2023-06-03 ENCOUNTER — Other Ambulatory Visit: Payer: Self-pay

## 2023-06-03 DIAGNOSIS — R400 Somnolence: Secondary | ICD-10-CM | POA: Diagnosis not present

## 2023-06-03 DIAGNOSIS — Z7901 Long term (current) use of anticoagulants: Secondary | ICD-10-CM | POA: Insufficient documentation

## 2023-06-03 DIAGNOSIS — E119 Type 2 diabetes mellitus without complications: Secondary | ICD-10-CM | POA: Insufficient documentation

## 2023-06-03 DIAGNOSIS — Z7985 Long-term (current) use of injectable non-insulin antidiabetic drugs: Secondary | ICD-10-CM | POA: Diagnosis not present

## 2023-06-03 DIAGNOSIS — I480 Paroxysmal atrial fibrillation: Secondary | ICD-10-CM | POA: Insufficient documentation

## 2023-06-03 DIAGNOSIS — E785 Hyperlipidemia, unspecified: Secondary | ICD-10-CM | POA: Insufficient documentation

## 2023-06-03 DIAGNOSIS — D6869 Other thrombophilia: Secondary | ICD-10-CM | POA: Insufficient documentation

## 2023-06-03 DIAGNOSIS — I4891 Unspecified atrial fibrillation: Secondary | ICD-10-CM | POA: Diagnosis not present

## 2023-06-03 DIAGNOSIS — Z79899 Other long term (current) drug therapy: Secondary | ICD-10-CM | POA: Diagnosis not present

## 2023-06-03 DIAGNOSIS — I10 Essential (primary) hypertension: Secondary | ICD-10-CM | POA: Insufficient documentation

## 2023-06-03 DIAGNOSIS — K219 Gastro-esophageal reflux disease without esophagitis: Secondary | ICD-10-CM | POA: Diagnosis not present

## 2023-06-03 DIAGNOSIS — I2510 Atherosclerotic heart disease of native coronary artery without angina pectoris: Secondary | ICD-10-CM | POA: Insufficient documentation

## 2023-06-03 DIAGNOSIS — Z6838 Body mass index (BMI) 38.0-38.9, adult: Secondary | ICD-10-CM | POA: Insufficient documentation

## 2023-06-03 HISTORY — PX: ATRIAL FIBRILLATION ABLATION: EP1191

## 2023-06-03 LAB — GLUCOSE, CAPILLARY
Glucose-Capillary: 83 mg/dL (ref 70–99)
Glucose-Capillary: 93 mg/dL (ref 70–99)

## 2023-06-03 SURGERY — ATRIAL FIBRILLATION ABLATION
Anesthesia: General

## 2023-06-03 MED ORDER — LABETALOL HCL 5 MG/ML IV SOLN
INTRAVENOUS | Status: DC | PRN
  Administered 2023-06-03: 5 mg via INTRAVENOUS
  Administered 2023-06-03: 2.5 mg via INTRAVENOUS
  Administered 2023-06-03: 5 mg via INTRAVENOUS

## 2023-06-03 MED ORDER — ATROPINE SULFATE 1 MG/10ML IJ SOSY
PREFILLED_SYRINGE | INTRAMUSCULAR | Status: AC
  Filled 2023-06-03: qty 10

## 2023-06-03 MED ORDER — DEXAMETHASONE SODIUM PHOSPHATE 10 MG/ML IJ SOLN
INTRAMUSCULAR | Status: DC | PRN
  Administered 2023-06-03: 5 mg via INTRAVENOUS

## 2023-06-03 MED ORDER — HEPARIN SODIUM (PORCINE) 1000 UNIT/ML IJ SOLN
INTRAMUSCULAR | Status: DC | PRN
  Administered 2023-06-03: 16000 [IU] via INTRAVENOUS

## 2023-06-03 MED ORDER — HEPARIN SODIUM (PORCINE) 1000 UNIT/ML IJ SOLN
INTRAMUSCULAR | Status: AC
  Filled 2023-06-03: qty 1

## 2023-06-03 MED ORDER — ONDANSETRON HCL 4 MG/2ML IJ SOLN
4.0000 mg | Freq: Four times a day (QID) | INTRAMUSCULAR | Status: DC | PRN
Start: 2023-06-03 — End: 2023-06-03

## 2023-06-03 MED ORDER — EPHEDRINE SULFATE (PRESSORS) 50 MG/ML IJ SOLN
INTRAMUSCULAR | Status: DC | PRN
  Administered 2023-06-03: 5 mg via INTRAVENOUS

## 2023-06-03 MED ORDER — LACTATED RINGERS IV SOLN: INTRAVENOUS | Status: DC | PRN

## 2023-06-03 MED ORDER — ATROPINE SULFATE 1 MG/ML IV SOLN
INTRAVENOUS | Status: DC | PRN
  Administered 2023-06-03: 1 mg via INTRAVENOUS

## 2023-06-03 MED ORDER — SUGAMMADEX SODIUM 200 MG/2ML IV SOLN
INTRAVENOUS | Status: DC | PRN
  Administered 2023-06-03: 200 mg via INTRAVENOUS

## 2023-06-03 MED ORDER — FENTANYL CITRATE (PF) 100 MCG/2ML IJ SOLN
INTRAMUSCULAR | Status: AC
  Filled 2023-06-03: qty 2

## 2023-06-03 MED ORDER — PHENYLEPHRINE HCL-NACL 20-0.9 MG/250ML-% IV SOLN
INTRAVENOUS | Status: DC | PRN
  Administered 2023-06-03: 20 ug/min via INTRAVENOUS

## 2023-06-03 MED ORDER — PHENYLEPHRINE 80 MCG/ML (10ML) SYRINGE FOR IV PUSH (FOR BLOOD PRESSURE SUPPORT)
PREFILLED_SYRINGE | INTRAVENOUS | Status: DC | PRN
  Administered 2023-06-03 (×2): 160 ug via INTRAVENOUS

## 2023-06-03 MED ORDER — ROCURONIUM BROMIDE 10 MG/ML (PF) SYRINGE
PREFILLED_SYRINGE | INTRAVENOUS | Status: DC | PRN
Start: 2023-06-03 — End: 2023-06-03
  Administered 2023-06-03: 70 mg via INTRAVENOUS

## 2023-06-03 MED ORDER — PROTAMINE SULFATE 10 MG/ML IV SOLN
INTRAVENOUS | Status: DC | PRN
  Administered 2023-06-03: 50 mg via INTRAVENOUS

## 2023-06-03 MED ORDER — HEPARIN (PORCINE) IN NACL 1000-0.9 UT/500ML-% IV SOLN
INTRAVENOUS | Status: DC | PRN
  Administered 2023-06-03 (×3): 500 mL

## 2023-06-03 MED ORDER — MIDAZOLAM HCL 2 MG/2ML IJ SOLN
INTRAMUSCULAR | Status: AC
  Filled 2023-06-03: qty 2

## 2023-06-03 MED ORDER — FENTANYL CITRATE (PF) 250 MCG/5ML IJ SOLN
INTRAMUSCULAR | Status: DC | PRN
  Administered 2023-06-03: 50 ug via INTRAVENOUS

## 2023-06-03 MED ORDER — ACETAMINOPHEN 325 MG PO TABS
650.0000 mg | ORAL_TABLET | ORAL | Status: DC | PRN
  Administered 2023-06-03: 650 mg via ORAL
  Filled 2023-06-03: qty 2

## 2023-06-03 MED ORDER — SODIUM CHLORIDE 0.9% FLUSH: 3.0000 mL | INTRAVENOUS | Status: DC | PRN

## 2023-06-03 MED ORDER — MIDAZOLAM HCL 2 MG/2ML IJ SOLN
INTRAMUSCULAR | Status: DC | PRN
Start: 2023-06-03 — End: 2023-06-03
  Administered 2023-06-03: 1 mg via INTRAVENOUS

## 2023-06-03 MED ORDER — PROPOFOL 10 MG/ML IV BOLUS
INTRAVENOUS | Status: DC | PRN
  Administered 2023-06-03: 160 mg via INTRAVENOUS
  Administered 2023-06-03: 40 mg via INTRAVENOUS

## 2023-06-03 MED ORDER — ONDANSETRON HCL 4 MG/2ML IJ SOLN
INTRAMUSCULAR | Status: DC | PRN
  Administered 2023-06-03: 4 mg via INTRAVENOUS

## 2023-06-03 MED ORDER — LIDOCAINE 2% (20 MG/ML) 5 ML SYRINGE
INTRAMUSCULAR | Status: DC | PRN
Start: 2023-06-03 — End: 2023-06-03
  Administered 2023-06-03: 100 mg via INTRAVENOUS

## 2023-06-03 MED ORDER — SODIUM CHLORIDE 0.9 % IV SOLN: 250.0000 mL | INTRAVENOUS | Status: DC | PRN

## 2023-06-03 MED ORDER — SODIUM CHLORIDE 0.9 % IV SOLN: INTRAVENOUS | Status: DC

## 2023-06-03 SURGICAL SUPPLY — 2 items
CATH 8FR REPROCESSED SOUNDSTAR (CATHETERS) ×1 IMPLANT
INQWIRE 1.5J .035X260CM (WIRE) ×1 IMPLANT

## 2023-06-03 NOTE — Anesthesia Postprocedure Evaluation (Signed)
 Anesthesia Post Note  Patient: Linda Olson  Procedure(s) Performed: ATRIAL FIBRILLATION ABLATION     Patient location during evaluation: PACU Anesthesia Type: General Level of consciousness: sedated and patient cooperative Pain management: pain level controlled Vital Signs Assessment: post-procedure vital signs reviewed and stable Respiratory status: spontaneous breathing Cardiovascular status: stable Anesthetic complications: no   There were no known notable events for this encounter.  Last Vitals:  Vitals:   06/03/23 1258 06/03/23 1300  BP:  (!) 141/79  Pulse:  81  Resp:  (!) 21  Temp: 36.7 C   SpO2:  99%    Last Pain:  Vitals:   06/03/23 1258  TempSrc: Axillary  PainSc: 0-No pain                 Lewie Loron

## 2023-06-03 NOTE — Progress Notes (Signed)
 Dr. Nelly Laurence at bedside talking w/patient after being notified of dull chest discomfort. No orders.

## 2023-06-03 NOTE — Transfer of Care (Signed)
 Immediate Anesthesia Transfer of Care Note  Patient: Linda Olson  Procedure(s) Performed: ATRIAL FIBRILLATION ABLATION  Patient Location: PACU  Anesthesia Type:General  Level of Consciousness: awake, drowsy, and patient cooperative  Airway & Oxygen Therapy: Patient Spontanous Breathing and Patient connected to nasal cannula oxygen  Post-op Assessment: Report given to RN, Post -op Vital signs reviewed and stable, and Patient moving all extremities X 4  Post vital signs: Reviewed and stable  Last Vitals:  Vitals Value Taken Time  BP 140/74 06/03/23 1252  Temp    Pulse 84 06/03/23 1254  Resp 22 06/03/23 1254  SpO2 100 % 06/03/23 1254  Vitals shown include unfiled device data.  Last Pain:  Vitals:   06/03/23 1016  TempSrc: Oral  PainSc: 0-No pain         Complications: There were no known notable events for this encounter.

## 2023-06-03 NOTE — H&P (Signed)
 Electrophysiology Office Note:    Date:  06/03/2023   ID:  Linda Olson, DOB 1960/09/13, MRN [id number]  PCP:  Sigmund Hazel, MD   [facility] Providers Cardiologist:  None Electrophysiologist:  Nobie Putnam, MD     Referring MD: No ref. provider found   History of Present Illness:    Linda Olson is a 63 y.o. female with a medical history significant for atrial fibrillation, anticoagulation with Xarelto, hypertension, hyperlipidemia obesity referred for management of atrial fibrillation.     She was hospitalized in February 2024 due to palpitations and diagnosed with atrial fibrillation with RVR.  She converted back to sinus rhythm after receiving diltiazem echo showed normal EF.  She had recurrence of atrial fibrillation in November 2024.  She has experienced weight loss on Mounjaro  I discussed the use of AI scribe software for clinical note transcription with the patient, who gave verbal consent to proceed.  The patient, with a history of atrial fibrillation, presents for a consultation with a cardiologist. They report multiple episodes of atrial fibrillation, with the first episode occurring in November 2023. The patient has been to the ER several times due to this condition. They describe an episode where they were about to be shocked out of the condition, but it resolved on its own. The patient has been taking metoprolol to manage their condition, but only takes it when they can't control the atrial fibrillation. They also report feeling tired during the day, but attribute this to staying up late. The patient has not been tested for sleep apnea, but the doctor suggests this as a possible contributing factor to their condition.     Today, she reports that she is doing well. No palpitations, pre-syncope.  I reviewed the patient's CT and labs. There was no LAA thrombus. she  has not missed any doses of anticoagulation, and she took her dose last night. There  have been no changes in the patient's diagnoses, medications, or condition since our recent clinic visit.   EKGs/Labs/Other Studies Reviewed Today:     Echocardiogram:  TTE April 16, 2022 EF 60 to 65%.  Aneurysm of the ascending aorta noted, 42 mm normal sized atria   Monitors:   Stress testing:   Advanced imaging:  CT coronary Calcium score 104, involving the LAD.  95th percentile for age  Cardiac catherization    EKG:         Physical Exam:    VS:  BP 132/88 (BP Location: Right Arm)   Pulse (!) 58   Temp 98.4 F (36.9 C) (Oral)   Resp (!) 22   Ht 5' (1.524 m)   Wt 89.4 kg   SpO2 99%   BMI 38.47 kg/m     Wt Readings from Last 3 Encounters:  06/03/23 89.4 kg  05/15/23 89.4 kg  04/17/23 92.1 kg     GEN: Well nourished, well developed in no acute distress CARDIAC: RRR, no murmurs, rubs, gallops RESPIRATORY:  Normal work of breathing MUSCULOSKELETAL: no edema    ASSESSMENT & PLAN:     Paroxysmal Atrial fibrillation Episodes in February 2024 in November 2024 With daytime sleepiness, uncertainty whether she snores, we will order a sleep apnea test We discussed options for rhythm management.  I recommended ablation and described the procedure to her in detail.  She would like to proceed with scheduling  We discussed the indication, rationale, logistics, anticipated benefits, and potential risks of the ablation procedure including but not limited to --  bleed at the groin access site, chest pain, damage to nearby organs such as the diaphragm, lungs, or esophagus, need for a drainage tube, or prolonged hospitalization. I explained that the risk for stroke, heart attack, need for open chest surgery, or even death is very low but not zero. she  expressed understanding and wishes to proceed.   Secondary hypercoagulable state CHA2DS2-VASc score is 3 Continue Xarelto 20 mg daily  Hypertension BP controlled today  Morbid obesity We discussed the  importance of weight loss for maintenance of sinus rhythm Continue Mounjaro  Diabetes Well-controlled Continue [name]    Signed, Maurice Small, MD  06/03/2023 10:51 AM    [facility]

## 2023-06-03 NOTE — Anesthesia Preprocedure Evaluation (Addendum)
 Anesthesia Evaluation  Patient identified by MRN, date of birth, ID band Patient awake    Reviewed: Allergy & Precautions, NPO status , Patient's Chart, lab work & pertinent test results, reviewed documented beta blocker date and time   Airway Mallampati: II  TM Distance: >3 FB Neck ROM: Full    Dental no notable dental hx. (+) Dental Advisory Given, Teeth Intact   Pulmonary asthma , pneumonia   Pulmonary exam normal breath sounds clear to auscultation       Cardiovascular hypertension, Pt. on home beta blockers and Pt. on medications + CAD  Normal cardiovascular exam Rhythm:Regular Rate:Normal  CT Coronary Ca++ 05/2023 1. There is normal pulmonary vein drainage into the left atrium with ostial measurements above. 2. There is no thrombus in the left atrial appendage. 3. The esophagus runs in the right atrial midline and is in proximity to the right inferior pulmonary vein ostium. 4. No PFO/ASD. 5. Normal coronary origin. Right dominance. 6. CAC score of 186 which is 95th percentile for age-, race-, and sex-matched controls.     Echo 04/2022  1. Left ventricular ejection fraction, by estimation, is 60 to 65%. The  left ventricle has normal function. The left ventricle has no regional  wall motion abnormalities. Left ventricular diastolic parameters were  normal.   2. Right ventricular systolic function is normal. The right ventricular  size is normal.   3. No evidence of mitral valve regurgitation.   4. The aortic valve is tricuspid. There is mild calcification of the  aortic valve. Aortic valve regurgitation is not visualized.   5. Aneurysm of the ascending aorta, measuring 42 mm.   6. The inferior vena cava is normal in size with greater than 50%  respiratory variability, suggesting right atrial pressure of 3 mmHg.   Comparison(s): No significant change from prior study.   Conclusion(s)/Recommendation(s): Normal  biventricular function without  evidence of hemodynamically significant valvular heart disease.     Neuro/Psych  Headaches  Neuromuscular disease    GI/Hepatic Neg liver ROS,GERD  ,,  Endo/Other  diabetes    Renal/GU negative Renal ROS     Musculoskeletal negative musculoskeletal ROS (+)    Abdominal  (+) + obese  Peds  Hematology negative hematology ROS (+)   Anesthesia Other Findings   Reproductive/Obstetrics                             Anesthesia Physical Anesthesia Plan  ASA: 3  Anesthesia Plan: General   Post-op Pain Management: Tylenol PO (pre-op)*   Induction: Intravenous  PONV Risk Score and Plan: 3 and Ondansetron, Dexamethasone and Treatment may vary due to age or medical condition  Airway Management Planned: Oral ETT  Additional Equipment:   Intra-op Plan:   Post-operative Plan: Extubation in OR  Informed Consent: I have reviewed the patients History and Physical, chart, labs and discussed the procedure including the risks, benefits and alternatives for the proposed anesthesia with the patient or authorized representative who has indicated his/her understanding and acceptance.     Dental advisory given  Plan Discussed with: CRNA  Anesthesia Plan Comments:         Anesthesia Quick Evaluation

## 2023-06-03 NOTE — Discharge Instructions (Signed)

## 2023-06-03 NOTE — Anesthesia Procedure Notes (Addendum)
 Procedure Name: Intubation Date/Time: 06/03/2023 11:34 AM  Performed by: Lewie Loron, MDPre-anesthesia Checklist: Patient identified, Emergency Drugs available, Suction available and Patient being monitored Patient Re-evaluated:Patient Re-evaluated prior to induction Oxygen Delivery Method: Circle system utilized Preoxygenation: Pre-oxygenation with 100% oxygen Induction Type: IV induction Ventilation: Mask ventilation without difficulty and Oral airway inserted - appropriate to patient size Laryngoscope Size: Mac and 3 Grade View: Grade I Tube type: Oral Tube size: 7.0 mm Number of attempts: 1 Airway Equipment and Method: Stylet and Oral airway Placement Confirmation: ETT inserted through vocal cords under direct vision, positive ETCO2 and breath sounds checked- equal and bilateral Secured at: 22 cm Tube secured with: Tape Dental Injury: Teeth and Oropharynx as per pre-operative assessment

## 2023-06-04 ENCOUNTER — Encounter (HOSPITAL_COMMUNITY): Payer: Self-pay | Admitting: Cardiovascular Disease

## 2023-06-04 ENCOUNTER — Telehealth (HOSPITAL_COMMUNITY): Payer: Self-pay

## 2023-06-04 LAB — POCT ACTIVATED CLOTTING TIME: Activated Clotting Time: 308 s

## 2023-06-04 MED FILL — Fentanyl Citrate Preservative Free (PF) Inj 100 MCG/2ML: INTRAMUSCULAR | Qty: 1 | Status: AC

## 2023-06-04 NOTE — Telephone Encounter (Signed)
 Spoke with patient to complete post procedure follow up call.  Patient reports no complications with groin sites.   Instructions reviewed with patient:  Remove large bandage at puncture site after 24 hours. It is normal to have bruising, tenderness and a pea or marble sized lump/knot at the groin site which can take up to three months to resolve.  Get help right away if you notice sudden swelling at the puncture site.  Check your puncture site every day for signs of infection: fever, redness, swelling, pus drainage, warmth, foul odor or excessive pain. If this occurs, please call the office at [phone], to speak with the nurse. Get help right away if your puncture site is bleeding and the bleeding does not stop after applying firm pressure to the area.  You may continue to have skipped beats/ atrial fibrillation during the first several months after your procedure.  It is very important not to miss any doses of your blood thinner Xarelto. Patient restarted taking this medication on yesterday, 06/03/23.   You will follow up with the Afib clinic on 07/01/23 and follow up with the APP on 09/04/23.   Patient verbalized understanding to all instructions provided.

## 2023-06-16 NOTE — Progress Notes (Unsigned)
   SUBJECTIVE: Discussed the use of AI scribe software for clinical note transcription with the patient, who gave verbal consent to proceed.  Chief Complaint: Obesity  Interim History: ***  Linda Olson is here to discuss her progress with her obesity treatment plan. She is on the {HWW Weight Loss Plan:210964005} and states she {CHL AMB IS/IS NOT:210130109} following her eating plan approximately *** % of the time. She states she {CHL AMB IS/IS NOT:210130109} exercising *** minutes *** times per week.   OBJECTIVE: Visit Diagnoses: Problem List Items Addressed This Visit     Hypertension associated with type 2 diabetes mellitus (HCC)   Paroxysmal atrial fibrillation with RVR (HCC)   Vitamin D deficiency   Obesity, Beginning BMI 46.29   Diabetes mellitus (HCC) - Primary (Chronic)   Other Visit Diagnoses       Hyperlipidemia associated with type 2 diabetes mellitus (HCC)         Other fatigue           No data recorded No data recorded No data recorded No data recorded   ASSESSMENT AND PLAN:  Diet: Linda Olson {CHL AMB IS/IS NOT:210130109} currently in the action stage of change. As such, her goal is to {HWW Weight Loss Efforts:210964006}. She {HAS HAS HYQ:65784} agreed to {HWW Weight Loss Plan:210964005}.  Exercise: Linda Olson has been instructed {HWW Exercise:210964007} for weight loss and overall health benefits.   Behavior Modification:  We discussed the following Behavioral Modification Strategies today: {HWW Behavior Modification:210964008}. We discussed various medication options to help Linda Olson with her weight loss efforts and we both agreed to ***.  No follow-ups on file.Linda Olson She was informed of the importance of frequent follow up visits to maximize her success with intensive lifestyle modifications for her multiple health conditions.  Attestation Statements:   Reviewed by clinician on day of visit: allergies, medications, problem list, medical history, surgical  history, family history, social history, and previous encounter notes.   Time spent on visit including pre-visit chart review and post-visit care and charting was *** minutes.    Linda Marcelino, PA-C

## 2023-06-17 ENCOUNTER — Ambulatory Visit (INDEPENDENT_AMBULATORY_CARE_PROVIDER_SITE_OTHER): Admitting: Physician Assistant

## 2023-06-17 DIAGNOSIS — I480 Paroxysmal atrial fibrillation: Secondary | ICD-10-CM

## 2023-06-17 DIAGNOSIS — I152 Hypertension secondary to endocrine disorders: Secondary | ICD-10-CM

## 2023-06-17 DIAGNOSIS — E1169 Type 2 diabetes mellitus with other specified complication: Secondary | ICD-10-CM

## 2023-06-17 DIAGNOSIS — R5383 Other fatigue: Secondary | ICD-10-CM

## 2023-06-17 DIAGNOSIS — E559 Vitamin D deficiency, unspecified: Secondary | ICD-10-CM

## 2023-06-19 ENCOUNTER — Encounter: Payer: Self-pay | Admitting: Emergency Medicine

## 2023-06-27 ENCOUNTER — Other Ambulatory Visit (HOSPITAL_COMMUNITY): Payer: Self-pay

## 2023-07-01 ENCOUNTER — Ambulatory Visit (HOSPITAL_COMMUNITY)
Admission: RE | Admit: 2023-07-01 | Discharge: 2023-07-01 | Disposition: A | Discharge status: Home / Self Care | Source: Ambulatory Visit | Attending: Physician Assistant | Admitting: Physician Assistant

## 2023-07-01 ENCOUNTER — Encounter (HOSPITAL_COMMUNITY): Payer: Self-pay | Admitting: Physician Assistant

## 2023-07-01 VITALS — BP 126/80 | HR 63 | Ht 60.0 in | Wt 209.6 lb

## 2023-07-01 DIAGNOSIS — I480 Paroxysmal atrial fibrillation: Secondary | ICD-10-CM | POA: Diagnosis not present

## 2023-07-01 DIAGNOSIS — D6869 Other thrombophilia: Secondary | ICD-10-CM

## 2023-07-01 NOTE — Progress Notes (Signed)
 Primary Care Physician: Perley Bradley, MD Primary Cardiologist: Dr Renna Cary Primary Electrophysiologist: Dr Arlester Ladd Referring Physician: [facility]   Linda Olson is a 63 y.o. female with a history of HTN, DM, HLD, atrial fibrillation who presents for follow up in the [facility]. She was admitted to the hospital from February 2024 due to palpitations where she was found to be in A-fib with RVR. She converted back to normal sinus rhythm after a Cardizem  bolus. She had an echocardiogram in the hospital which showed normal ejection fraction and no valvular disease. Patient is on Xarelto  for stroke prevention.   Episode of A-fib that led to ED visit included the following symptoms: rushing sensation in chest, vision change, heavy breathing, feeling faint, and weakness. She was seen by Dr Arlester Ladd and underwent afib ablation on 06/03/23.   Patient returns for follow up for atrial fibrillation. She is in SR today. Since the ablation, she has had several episodes of tachypalpitations lasting < 10 minutes. She usually notices them when she lays down at night. She denies chest pain or groin issues.   Today, she  denies symptoms of chest pain, shortness of breath, orthopnea, PND, lower extremity edema, dizziness, presyncope, syncope, bleeding, or neurologic sequela. The patient is tolerating medications without difficulties and is otherwise without complaint today.    Atrial Fibrillation Risk Factors:  she does not have a history of alcohol use. The patient does not have a history of early familial atrial fibrillation or other arrhythmias.   Atrial Fibrillation Management history:  Previous antiarrhythmic drugs: none Previous cardioversions: none Previous ablations: 06/03/23 Anticoagulation history: Xarelto    Past Medical History:  Diagnosis Date   Asthma    Atrial fibrillation (HCC)    Back pain    Bell's palsy    BMI 40.0-44.9, adult (HCC)    Chest pain     Chewing difficulty    Constipation    GERD (gastroesophageal reflux disease)    Hyperlipidemia    Hypertension    Low vitamin D  level    per notes from [facility]   Obesity    per notes from [facility]   Orbital floor fracture [facility])    [facility]; per notes from [facility]   Prediabetes    per notes from [facility]   Preeclampsia    per notes from [facility]    Current Outpatient Medications  Medication Sig Dispense Refill   acetaminophen  (TYLENOL ) 500 MG tablet Take 500 mg by mouth 2 (two) times daily as needed for moderate pain (pain score 4-6), fever or headache.     atorvastatin  (LIPITOR) 40 MG tablet Take 1 tablet (40 mg total) by mouth daily. 90 tablet 0   Cholecalciferol  (VITAMIN D -3 PO) Take 1 tablet by mouth daily.     losartan -hydrochlorothiazide  (HYZAAR ) 50-12.5 MG tablet TAKE 1 TABLET BY MOUTH EVERY DAY 90 tablet 0   metoprolol  tartrate (LOPRESSOR ) 25 MG tablet Take 25 mg by mouth 2 (two) times daily as needed (HR > 100).     rivaroxaban  (XARELTO ) 20 MG TABS tablet Take 1 tablet (20 mg total) by mouth daily with supper. 90 tablet 3   tirzepatide  (MOUNJARO ) 12.5 MG/0.5ML Pen Inject 12.5 mg into the skin once a week. 2 mL 1   No current facility-administered medications for this encounter.    ROS- All systems are reviewed and negative except as per the HPI above.  Physical Exam: Vitals:   07/01/23 1500  BP:  126/80  Pulse: 63  Weight: 95.1 kg  Height: 5' (1.524 m)    GEN: Well nourished, well developed in no acute distress CARDIAC: Regular rate and rhythm, no murmurs, rubs, gallops RESPIRATORY:  Clear to auscultation without rales, wheezing or rhonchi  ABDOMEN: Soft, non-tender, non-distended EXTREMITIES:  No edema; No deformity    Wt Readings from Last 3 Encounters:  07/01/23 95.1 kg  06/03/23 89.4 kg  05/15/23 89.4 kg    EKG today demonstrates  SR Vent. rate 63 BPM PR interval 154 ms QRS duration 74 ms QT/QTcB  392/401 ms   Echo 04/16/22 demonstrated   1. Left ventricular ejection fraction, by estimation, is 60 to 65%. The  left ventricle has normal function. The left ventricle has no regional  wall motion abnormalities. Left ventricular diastolic parameters were  normal.   2. Right ventricular systolic function is normal. The right ventricular  size is normal.   3. No evidence of mitral valve regurgitation.   4. The aortic valve is tricuspid. There is mild calcification of the  aortic valve. Aortic valve regurgitation is not visualized.   5. Aneurysm of the ascending aorta, measuring 42 mm.   6. The inferior vena cava is normal in size with greater than 50%  respiratory variability, suggesting right atrial pressure of 3 mmHg.   Comparison(s): No significant change from prior study.   Conclusion(s)/Recommendation(s): Normal biventricular function without  evidence of hemodynamically significant valvular heart disease.    Epic records are reviewed at length today  CHA2DS2-VASc Score = 4  The patient's score is based upon: CHF History: 0 HTN History: 1 Diabetes History: 1 Stroke History: 0 Vascular Disease History: 1 Age Score: 0 Gender Score: 1       ASSESSMENT AND PLAN: Paroxysmal Atrial Fibrillation (ICD10:  I48.0) The patient's CHA2DS2-VASc score is 4, indicating a 4.8% annual risk of stroke.   S/p afib ablation 06/03/23 Patient appears to be maintaining SR. We discussed that it is not unusual to have increased ectopy or brief afib episodes soon after an ablation.  Continue Xarelto  20 mg daily with no missed doses for 3 months post ablation.  Continue Lopressor  BID PRN for heart racing  Secondary Hypercoagulable State (ICD10:  D68.69) The patient is at significant risk for stroke/thromboembolism based upon her CHA2DS2-VASc Score of 4.  Continue Rivaroxaban  (Xarelto ).   Obesity Body mass index is 40.93 kg/m.  Encouraged lifestyle modification Currently on  Mounjaro   HTN Stable on current regimen   Follow up with Creighton Doffing as scheduled.    Myrtha Ates PA-C Afib Clinic [facility] [address] [place], [place] [address] [phone] 07/01/2023 3:08 PM

## 2023-07-21 NOTE — Progress Notes (Signed)
 SUBJECTIVE: Discussed the use of AI scribe software for clinical note transcription with the patient, who gave verbal consent to proceed.  Chief Complaint: Obesity  Interim History: She is up 11 lbs since last visit in March.   [name] is here to discuss her progress with her obesity treatment plan. She is on the Category 2 Plan and states she is not following her eating plan approximately 0 % of the time. She states she is not exercising 0 minutes 0 times per week.  Linda Olson is a 63 year old female with obesity, type 2 diabetes, and hypertension who presents for follow-up on her obesity treatment plan.  She has gained eleven pounds since her last visit, attributing the weight gain to stress and emotional eating, particularly after her sister was involved in a traumatic incident. She has been consuming ice cream frequently, specifically [employer], but has not had any in the past two weeks. She was previously down 40 pounds but has regained 11 pounds, now at a net loss of 29 pounds.  She has a history of type 2 diabetes and hypertension. She is currently using Mounjaro  for her diabetes management but has run out of her supply. She has not skipped any weeks of Mounjaro  use until now. She also mentions that she has been less active recently, partly due to waiting for clearance from her cardiologist to resume physical activity. She plans to start walking again, possibly at work during break times.  Her sister has been staying with her due to safety concerns at her own home, which has contributed to a stressful environment and has impacted her eating habits. Her heart is doing well and has not gone back into atrial fibrillation. She has been cleared by her cardiologist to resume physical activity. OBJECTIVE: Visit Diagnoses: Problem List Items Addressed This Visit     BMI 40.0-44.9, adult (HCC)   Relevant Medications   tirzepatide  (MOUNJARO ) 12.5 MG/0.5ML Pen   Hypertension  associated with type 2 diabetes mellitus (HCC)   Relevant Medications   tirzepatide  (MOUNJARO ) 12.5 MG/0.5ML Pen   Paroxysmal atrial fibrillation with RVR (HCC)   Obesity, Beginning BMI 46.29   Relevant Medications   tirzepatide  (MOUNJARO ) 12.5 MG/0.5ML Pen   Diabetes mellitus (HCC) - Primary (Chronic)   Relevant Medications   tirzepatide  (MOUNJARO ) 12.5 MG/0.5ML Pen  Obesity Obesity with recent weight gain of 11 pounds since last visit, reducing previous weight loss from 40 to 29 pounds. Contributing factors include stress and increased intake of high-calorie foods. She is motivated to resume weight management. A low carbohydrate diet is proposed to facilitate weight loss, with an expected outcome of losing approximately 6 pounds in a month if strictly adhered to. The diet eliminates sweets, rice, plain potatoes, white bread, and tomatoes, focusing on lean proteins and vegetables. Alternatives include returning to a previous category two plan if the low carb diet is not sustainable. - Prescribe Mounjaro  with a refill for two months. - Initiate a low carbohydrate diet for one month, emphasizing strict adherence. If unable to follow completely, shift back to consistent Category 2 plan.  - Advise removal of high-calorie/high fat non plan foods from the home environment. - Encourage daily physical activity, such as walking for 15-20 minutes after dinner. - Schedule follow-up appointments on June 24th at 3 PM and July 28th at 8 AM.  Type 2 diabetes mellitus Type 2 diabetes mellitus with focus on weight management as a key component of diabetes management. On Mounjaro  12.5  mg weekly- Denies mass in neck, dysphagia, dyspepsia, persistent hoarseness, abdominal pain, or N/V/Constipation or diarrhea. Has annual eye exam. Mood is stable.  Lab Results  Component Value Date   HGBA1C 5.8 (H) 05/15/2023   HGBA1C 5.8 (H) 01/01/2023   HGBA1C 6.0 (H) 12/20/2021   Lab Results  Component Value Date    LDLCALC 112 (H) 05/15/2023   CREATININE 0.90 05/15/2023   She is working  on nutrition plan to decrease simple carbohydrates, increase lean proteins and exercise to promote weight loss and improve glycemic control . - Continue current diabetes management plan with emphasis on weight loss and dietary modifications. Refill/continue Mounjaro  12.5 mg weekly Meds ordered this encounter  Medications   tirzepatide  (MOUNJARO ) 12.5 MG/0.5ML Pen    Sig: Inject 12.5 mg into the skin once a week.    Dispense:  2 mL    Refill:  1   Hypertension Hypertension asymptomatic, reasonably well controlled, and no significant medication side effects noted. No signs or symptoms of hypotension. Medication(s): Losartan  -hydrochlorothiazide  50-12.5 mg daily   Metoprolol  25 mg BID for HR> 100  BP Readings from Last 3 Encounters:  07/22/23 138/77  07/01/23 126/80  06/03/23 116/68   Lab Results  Component Value Date   CREATININE 0.90 05/15/2023   CREATININE 1.01 (H) 05/06/2023   CREATININE 0.89 01/14/2023   No results found for: "GFR"  Plan: Continue all antihypertensives at current dosages. Continue to work on nutrition plan to promote weight loss and improve BP control.  Monitor for hypotension on Mounjaro  with continued weight loss.    S/P ablation for PAF Has done well following Ablation procedure. Has follow up with cardiology after several months, but remains in NSR thus far.  Feeling stronger and cleared to start regular exercise again.  Continues on Xarelto  for stroke prophylaxis  Vitals Temp: 98 F (36.7 C) BP: 138/77 Pulse Rate: 65 SpO2: 100 %   Anthropometric Measurements Height: 5' (1.524 m) Weight: 208 lb (94.3 kg) BMI (Calculated): 40.62 Weight at Last Visit: 197 lb Weight Lost Since Last Visit: 0 Weight Gained Since Last Visit: 11 lb Total Weight Loss (lbs): 29 lb (13.2 kg)   Body Composition  Body Fat %: 48.5 % Fat Mass (lbs): 101.2 lbs Muscle Mass (lbs): 102  lbs Total Body Water (lbs): 80.8 lbs Visceral Fat Rating : 16   Other Clinical Data Fasting: No Labs: No Today's Visit #: 36     ASSESSMENT AND PLAN:  Diet: Dotti is currently in the action stage of change. As such, her goal is to get back to weight loss efforts. She has agreed to Category 2 Plan and following a lower carbohydrate, vegetable and lean protein rich diet plan.  Exercise: Dalis has been instructed to try a geriatric exercise plan and that some exercise is better than none for weight loss and overall health benefits.   Behavior Modification:  We discussed the following Behavioral Modification Strategies today: increasing lean protein intake, decreasing simple carbohydrates, increasing vegetables, increase H2O intake, increase high fiber foods, meal planning and cooking strategies, better snacking choices, emotional eating strategies , family/coworker sabotage, avoiding temptations, planning for success, and decrease junk food . We discussed various medication options to help Pennie with her weight loss efforts and we both agreed to get back on track with her nutrition plan and exercise.  Return in about 4 weeks (around 08/19/2023).Aaron Aas She was informed of the importance of frequent follow up visits to maximize her success with intensive lifestyle modifications for  her multiple health conditions.  Attestation Statements:   Reviewed by clinician on day of visit: allergies, medications, problem list, medical history, surgical history, family history, social history, and previous encounter notes.   Time spent on visit including pre-visit chart review and post-visit care and charting was 34 minutes.    Newel Oien, PA-C

## 2023-07-22 ENCOUNTER — Other Ambulatory Visit (HOSPITAL_COMMUNITY): Payer: Self-pay

## 2023-07-22 ENCOUNTER — Encounter (INDEPENDENT_AMBULATORY_CARE_PROVIDER_SITE_OTHER): Payer: Self-pay | Admitting: Physician Assistant

## 2023-07-22 ENCOUNTER — Ambulatory Visit (INDEPENDENT_AMBULATORY_CARE_PROVIDER_SITE_OTHER): Admitting: Physician Assistant

## 2023-07-22 VITALS — BP 138/77 | HR 65 | Temp 98.0°F | Ht 60.0 in | Wt 208.0 lb

## 2023-07-22 DIAGNOSIS — E1159 Type 2 diabetes mellitus with other circulatory complications: Secondary | ICD-10-CM | POA: Diagnosis not present

## 2023-07-22 DIAGNOSIS — Z7985 Long-term (current) use of injectable non-insulin antidiabetic drugs: Secondary | ICD-10-CM

## 2023-07-22 DIAGNOSIS — I152 Hypertension secondary to endocrine disorders: Secondary | ICD-10-CM | POA: Diagnosis not present

## 2023-07-22 DIAGNOSIS — I480 Paroxysmal atrial fibrillation: Secondary | ICD-10-CM

## 2023-07-22 DIAGNOSIS — E669 Obesity, unspecified: Secondary | ICD-10-CM

## 2023-07-22 DIAGNOSIS — Z6841 Body Mass Index (BMI) 40.0 and over, adult: Secondary | ICD-10-CM

## 2023-07-22 DIAGNOSIS — E1169 Type 2 diabetes mellitus with other specified complication: Secondary | ICD-10-CM

## 2023-07-22 DIAGNOSIS — E559 Vitamin D deficiency, unspecified: Secondary | ICD-10-CM

## 2023-07-22 MED ORDER — TIRZEPATIDE 12.5 MG/0.5ML ~~LOC~~ SOAJ
12.5000 mg | SUBCUTANEOUS | 1 refills | Status: DC
  Filled 2023-07-22: qty 2, 28d supply, fill #0
  Filled 2023-08-21: qty 2, 28d supply, fill #1

## 2023-07-23 ENCOUNTER — Other Ambulatory Visit (HOSPITAL_COMMUNITY): Payer: Self-pay

## 2023-08-21 ENCOUNTER — Other Ambulatory Visit (INDEPENDENT_AMBULATORY_CARE_PROVIDER_SITE_OTHER): Payer: Self-pay

## 2023-08-21 ENCOUNTER — Other Ambulatory Visit: Payer: Self-pay

## 2023-08-27 ENCOUNTER — Ambulatory Visit (INDEPENDENT_AMBULATORY_CARE_PROVIDER_SITE_OTHER): Admitting: Physician Assistant

## 2023-08-27 ENCOUNTER — Other Ambulatory Visit (HOSPITAL_COMMUNITY): Payer: Self-pay

## 2023-08-27 ENCOUNTER — Other Ambulatory Visit (INDEPENDENT_AMBULATORY_CARE_PROVIDER_SITE_OTHER): Payer: Self-pay

## 2023-08-27 ENCOUNTER — Encounter (INDEPENDENT_AMBULATORY_CARE_PROVIDER_SITE_OTHER): Payer: Self-pay | Admitting: Physician Assistant

## 2023-08-27 VITALS — BP 147/84 | HR 71 | Temp 97.9°F | Ht 60.0 in | Wt 205.0 lb

## 2023-08-27 DIAGNOSIS — Z7985 Long-term (current) use of injectable non-insulin antidiabetic drugs: Secondary | ICD-10-CM

## 2023-08-27 DIAGNOSIS — E669 Obesity, unspecified: Secondary | ICD-10-CM

## 2023-08-27 DIAGNOSIS — E1169 Type 2 diabetes mellitus with other specified complication: Secondary | ICD-10-CM | POA: Diagnosis not present

## 2023-08-27 DIAGNOSIS — E1159 Type 2 diabetes mellitus with other circulatory complications: Secondary | ICD-10-CM | POA: Diagnosis not present

## 2023-08-27 DIAGNOSIS — I152 Hypertension secondary to endocrine disorders: Secondary | ICD-10-CM

## 2023-08-27 DIAGNOSIS — Z6841 Body Mass Index (BMI) 40.0 and over, adult: Secondary | ICD-10-CM

## 2023-08-27 DIAGNOSIS — E785 Hyperlipidemia, unspecified: Secondary | ICD-10-CM | POA: Diagnosis not present

## 2023-08-27 DIAGNOSIS — I480 Paroxysmal atrial fibrillation: Secondary | ICD-10-CM

## 2023-08-27 MED ORDER — LOSARTAN POTASSIUM-HCTZ 50-12.5 MG PO TABS
1.0000 | ORAL_TABLET | Freq: Every day | ORAL | 0 refills | Status: DC
Start: 2023-08-27 — End: 2023-12-23

## 2023-08-27 MED ORDER — TIRZEPATIDE 12.5 MG/0.5ML ~~LOC~~ SOAJ
12.5000 mg | SUBCUTANEOUS | 1 refills | Status: DC
  Filled 2023-08-27: qty 2, 28d supply, fill #0

## 2023-08-27 NOTE — Progress Notes (Signed)
 SUBJECTIVE: Discussed the use of AI scribe software for clinical note transcription with the patient, who gave verbal consent to proceed.  Chief Complaint: Obesity  Interim History: She is down 3 lbs since last visit. Down 32 lbs overall TBW loss of 13.5%  Linda Olson is here to discuss her progress with her obesity treatment plan. She is on the Category 2 Plan and following a lower carbohydrate, vegetable and lean protein rich diet plan and states she is following her eating plan approximately 70 % of the time. She states she is not exercising.  Linda Olson is a 63 year old female with obesity who presents for follow-up of her obesity treatment plan.  She has successfully lost 32 pounds, which is 13.5% of her body weight, and working towards goal of <200 lbs and future goal of 187 lbs.   She experiences hunger at times but manages it by reminding herself 'you're not hungry'. She incorporates broths with vegetables into her meals and is proud of her progress, despite occasional indulgences at family events.  She has type 2 diabetes and is currently on Mounjaro  12.5 mg weekly. Her last A1c was 5.8, a significant improvement from previous levels in the eights. She reports no side effects from Mounjaro  and is satisfied with its effects on her weight and diabetes management.Denies mass in neck, dysphagia, dyspepsia, persistent hoarseness, abdominal pain, or N/V/Constipation or diarrhea. Has annual eye exam. Mood is stable.   Her hypertension is managed with losartan  hydrochlorothiazide  50/12.5 mg daily. She notes her blood pressure was elevated today as she forgot to take her medication in the morning. Her BP is generally well controlled.   For hyperlipidemia, she takes Lipitor 40 mg daily. She is also on Xarelto  20 mg daily for anticoagulation due to her history of paroxysmal atrial fibrillation, which has been stable without recent episodes. She takes metoprolol  25 mg twice daily as  needed for heart rates over 100 bpm.  She experienced four episodes of epistaxis over the weekend, primarily from the nostril that was previously cauterized. She has contacted her ENT doctor- Dr Karis regarding this issue.  She maintains hydration and reports no cravings, ensuring she consumes enough protein. She is also on vitamin D3 supplementation, one tablet daily.  Plan fasting labs at next OV OBJECTIVE: Visit Diagnoses: Problem List Items Addressed This Visit     BMI 40.0-44.9, adult (HCC)   Relevant Medications   tirzepatide  (MOUNJARO ) 12.5 MG/0.5ML Pen   Hypertension associated with type 2 diabetes mellitus (HCC)   Relevant Medications   tirzepatide  (MOUNJARO ) 12.5 MG/0.5ML Pen   losartan -hydrochlorothiazide  (HYZAAR ) 50-12.5 MG tablet   Paroxysmal atrial fibrillation with RVR (HCC)   Relevant Medications   losartan -hydrochlorothiazide  (HYZAAR ) 50-12.5 MG tablet   Obesity, Beginning BMI 46.29   Relevant Medications   tirzepatide  (MOUNJARO ) 12.5 MG/0.5ML Pen   Diabetes mellitus (HCC) - Primary (Chronic)   Relevant Medications   tirzepatide  (MOUNJARO ) 12.5 MG/0.5ML Pen   losartan -hydrochlorothiazide  (HYZAAR ) 50-12.5 MG tablet   Other Visit Diagnoses       Hyperlipidemia associated with type 2 diabetes mellitus (HCC)       Relevant Medications   tirzepatide  (MOUNJARO ) 12.5 MG/0.5ML Pen   losartan -hydrochlorothiazide  (HYZAAR ) 50-12.5 MG tablet     Obesity She has lost 32 pounds, equating to 13.5% of her body weight. Her current body adipose percentage is 47.8%, with a target of 35% or less. Her visceral adipose rating is 15, aiming for 12 or less. Mounjaro  has been effective for  weight loss and diabetes management, with no reported side effects. Continued weight loss is expected to reduce cardiovascular and other disease risks.  - Continue Mounjaro  12.5 mg weekly - Encourage continued weight loss efforts - continue Cat 2 and Low carb nutrition plans - Schedule follow-up  appointment on September 30, 2023, for weight management and labs  Type 2 Diabetes Mellitus Diabetes is well-controlled with an A1c of 5.8, improved from previous levels in the eights. Mounjaro  without side effects. Denies mass in neck, dysphagia, dyspepsia, persistent hoarseness, abdominal pain, or N/V/Constipation or diarrhea. Has annual eye exam. Mood is stable.  Previously took metformin, which caused gastrointestinal upset. Lab Results  Component Value Date   HGBA1C 5.8 (H) 05/15/2023   HGBA1C 5.8 (H) 01/01/2023   HGBA1C 6.0 (H) 12/20/2021   Lab Results  Component Value Date   LDLCALC 112 (H) 05/15/2023   CREATININE 0.90 05/15/2023   She is working  on nutrition plan to decrease simple carbohydrates, increase lean proteins and exercise to promote weight loss and improve glycemic control . - Continue Mounjaro  12.5 mg weekly Fasting labs next OV.  Meds ordered this encounter  Medications   tirzepatide  (MOUNJARO ) 12.5 MG/0.5ML Pen    Sig: Inject 12.5 mg into the skin once a week.    Dispense:  2 mL    Refill:  1   losartan -hydrochlorothiazide  (HYZAAR ) 50-12.5 MG tablet    Sig: Take 1 tablet by mouth daily.    Dispense:  90 tablet    Refill:  0    Hypertension She is on losartan  hydrochlorothiazide  for management. She reported elevated blood pressure due to missing a dose. Prescription refill is needed. BP Readings from Last 3 Encounters:  08/27/23 (!) 147/84  07/22/23 138/77  07/01/23 126/80   Continue to work on nutrition plan to promote weight loss and improve BP control.   - Refill losartan  hydrochlorothiazide  50/12.5 mg and send to [facility] - Advise to take medication as prescribed to avoid elevated blood pressure  Hyperlipidemia She is on Lipitor 40 mg daily for management. No reported SE.  Lab Results  Component Value Date   CHOL 183 05/15/2023   CHOL 178 01/01/2023   CHOL 235 (H) 12/20/2021   Lab Results  Component Value Date   HDL 59 05/15/2023   HDL 51  01/01/2023   HDL 57 12/20/2021   Lab Results  Component Value Date   LDLCALC 112 (H) 05/15/2023   LDLCALC 113 (H) 01/01/2023   LDLCALC 165 (H) 12/20/2021   Lab Results  Component Value Date   TRIG 63 05/15/2023   TRIG 77 01/01/2023   TRIG 77 12/20/2021   Lab Results  Component Value Date   CHOLHDL 4.3 03/30/2021   CHOLHDL 3.8 08/09/2020   CHOLHDL 3.9 04/29/2020   No results found for: LDLDIRECT LDL is not at goal of < 55 . HDL at goal. Trig at goal.  May need to refer to cardiology lipid clinic .  Continue to work on nutrition plan -decreasing simple carbohydrates, increasing lean proteins, decreasing saturated fats and cholesterol , avoiding trans fats and exercise as able to promote weight loss, improve lipids and decrease cardiovascular risks. Fasting lipid panel next visit.  Continue Mounjaro  which should also help to decrease CV risks.  - Continue Lipitor 40 mg daily  Paroxysmal Atrial Fibrillation She is on metoprolol  for rate control and Xarelto  for anticoagulation. - Continue metoprolol  25 mg as needed for heart rates greater than 100 - Continue Xarelto  20 mg  daily - Attend cardiology follow-up on September 04, 2023  Epistaxis She experienced four episodes over the weekend, primarily from a previously cauterized nostril. Further evaluation and potential re-cauterization are needed. - Follow up with ENT - Dr Karis for evaluation and potential re-cauterization of nasal vessels  General Health Maintenance She is maintaining a healthy diet and hydration, and taking vitamin D3 for deficiency. Continued weight loss is emphasized for overall health improvement. - Continue vitamin D3 supplementation - Encourage hydration and healthy diet  Vitals Temp: 97.9 F (36.6 C) BP: (!) 147/84 Pulse Rate: 71 SpO2: 99 %   Anthropometric Measurements Height: 5' (1.524 m) Weight: 205 lb (93 kg) BMI (Calculated): 40.04 Weight at Last Visit: 208 lb Weight Lost Since Last Visit: 3  lb Weight Gained Since Last Visit: 0 Starting Weight: 237 lb Total Weight Loss (lbs): 32 lb (14.5 kg) Peak Weight: 270 lb   Body Composition  Body Fat %: 47.8 % Fat Mass (lbs): 98.4 lbs Muscle Mass (lbs): 102 lbs Total Body Water (lbs): 80.4 lbs Visceral Fat Rating : 15   Other Clinical Data Fasting: no Labs: no Today's Visit #: 37 Starting Date: 04/28/20     ASSESSMENT AND PLAN:  Diet: Electra is currently in the action stage of change. As such, her goal is to continue with weight loss efforts. She has agreed to Category 2 Plan and following a lower carbohydrate, vegetable and lean protein rich diet plan.  Exercise: Lucille has been instructed to work up to a goal of 150 minutes of combined cardio and strengthening exercise per week for weight loss and overall health benefits.   Behavior Modification:  We discussed the following Behavioral Modification Strategies today: increasing lean protein intake, decreasing simple carbohydrates, increasing vegetables, increase H2O intake, increase high fiber foods, no skipping meals, meal planning and cooking strategies, avoiding temptations, and planning for success. We discussed various medication options to help Jya with her weight loss efforts and we both agreed to continue current treatment plan and continue to work on nutritional and behavioral strategies to promote weight loss.  .  Return in about 5 weeks (around 10/01/2023).SABRA She was informed of the importance of frequent follow up visits to maximize her success with intensive lifestyle modifications for her multiple health conditions.  Attestation Statements:   Reviewed by clinician on day of visit: allergies, medications, problem list, medical history, surgical history, family history, social history, and previous encounter notes.   Time spent on visit including pre-visit chart review and post-visit care and charting was 33 minutes.    Stiles Maxcy, PA-C

## 2023-08-28 ENCOUNTER — Other Ambulatory Visit (HOSPITAL_COMMUNITY): Payer: Self-pay

## 2023-08-28 MED ORDER — ATORVASTATIN CALCIUM 40 MG PO TABS
40.0000 mg | ORAL_TABLET | Freq: Every day | ORAL | 3 refills | Status: AC
  Filled 2023-08-28: qty 30, 30d supply, fill #0

## 2023-09-03 NOTE — Progress Notes (Deleted)
  Electrophysiology Office Note:   Date:  09/03/2023  ID:  JAEDAH LORDS, DOB 1960/04/12, MRN [id number]  Primary Cardiologist: None Primary Heart Failure: None Electrophysiologist: Fonda Kitty, MD  {Click to update primary MD,subspecialty MD or APP then REFRESH:1}    History of Present Illness:   Linda Olson is a 63 y.o. female with h/o AF, HTN, CAD, DM II, thoracic aortic aneurysm seen today for routine electrophysiology follow-up s/p Ablation.  Since last being seen in our clinic the patient reports doing ***.  she denies chest pain, palpitations, dyspnea, PND, orthopnea, nausea, vomiting, dizziness, syncope, edema, weight gain, or early satiety.    Review of systems complete and found to be negative unless listed in HPI.   EP Information / Studies Reviewed:    EKG is ordered today. Personal review as below.      Studies:  ECHO 04/16/22 > LVEF 60-65%, no RWMA  CT Cardiac Morph / Pulm 05/15/23 > normal PV drainage into LA, no thrombus in LAA, no PFO/ASD, normal coronary origin / right dominance, CAC 186 / 95th percentile for matched controls EPS 06/03/23 > SR on presentation, successful ablation of PV's with PF energy, ablation of PW with PF energy   Arrhythmia / AAD AF    Risk Assessment/Calculations:    CHA2DS2-VASc Score = 4  {Confirm score is correct.  If not, click here to update score.  REFRESH note.  :1} This indicates a 4.8% annual risk of stroke. The patient's score is based upon: CHF History: 0 HTN History: 1 Diabetes History: 1 Stroke History: 0 Vascular Disease History: 1 Age Score: 0 Gender Score: 1   {This patient has a significant risk of stroke if diagnosed with atrial fibrillation.  Please consider VKA or DOAC agent for anticoagulation if the bleeding risk is acceptable.   You can also use the SmartPhrase .[email or web address] for documentation.   :[id number]} No BP recorded.  {Refresh Note OR Click here to enter BP  :1}***   STOP-Bang Score:  6   { Consider Dx Sleep Disordered Breathing or Sleep Apnea  ICD G47.33          :1}     Physical Exam:   VS:  There were no vitals taken for this visit.   Wt Readings from Last 3 Encounters:  08/27/23 205 lb (93 kg)  07/22/23 208 lb (94.3 kg)  07/01/23 209 lb 9.6 oz (95.1 kg)     GEN: Well nourished, well developed in no acute distress NECK: No JVD; No carotid bruits CARDIAC: {EPRHYTHM:[id number]}, no murmurs, rubs, gallops RESPIRATORY:  Clear to auscultation without rales, wheezing or rhonchi  ABDOMEN: Soft, non-tender, non-distended EXTREMITIES:  No edema; No deformity   ASSESSMENT AND PLAN:    Paroxysmal Atrial Fibrillation  CHA2DS2-VASc 4, s/p ablation 06/03/23 -OAC for stroke prophylaxis  -no AF burden since ablation  -monitors with *** -continue Lopressor  25mg  BID PRN   Secondary Hypercoagulable State  -continue Xarelto , dose reviewed and appropriate by ***  Hypertension  -well controlled on current regimen ***  Obesity  -on Mounjaro   -slow gradual weight loss encouraged   Follow up with Dr. Nancey {EPFOLLOW LE:71826}  Signed, Daphne Barrack, NP-C, AGACNP-BC [facility] - Electrophysiology  09/03/2023, 5:12 PM

## 2023-09-04 ENCOUNTER — Ambulatory Visit: Admitting: Pulmonary Disease

## 2023-09-04 ENCOUNTER — Ambulatory Visit (INDEPENDENT_AMBULATORY_CARE_PROVIDER_SITE_OTHER): Admitting: Otolaryngology

## 2023-09-04 ENCOUNTER — Encounter (INDEPENDENT_AMBULATORY_CARE_PROVIDER_SITE_OTHER): Payer: Self-pay | Admitting: Otolaryngology

## 2023-09-04 VITALS — BP 127/80 | HR 74

## 2023-09-04 DIAGNOSIS — R040 Epistaxis: Secondary | ICD-10-CM | POA: Diagnosis not present

## 2023-09-04 DIAGNOSIS — D6869 Other thrombophilia: Secondary | ICD-10-CM

## 2023-09-04 DIAGNOSIS — I480 Paroxysmal atrial fibrillation: Secondary | ICD-10-CM

## 2023-09-05 DIAGNOSIS — R040 Epistaxis: Secondary | ICD-10-CM | POA: Insufficient documentation

## 2023-09-05 NOTE — Progress Notes (Signed)
 Patient ID: Linda Olson, female   DOB: [birth year], 63 y.o.   MRN: [id number]  Procedure:  Endoscopic control of recurrent right epistaxis  Indication: The patient is a 63 year old female who presents today complaining of recurrent right-sided epistaxis.  She was last seen in June 2024.  At that time, multiple hypervascular areas were noted on her right nasal septum.  She was treated with the cauterization procedure.  According to the patient, she was doing well until 1 month ago, when she started experiencing right-sided epistaxis again.  She has had 4 episodes of right-sided epistaxis over the past month.  The bleeding is both anterior and posterior.  She denies any recent nasal trauma.  She is on Xarelto  daily.  Description:  The right nasal cavity is sprayed with topical xylocaine  and neo-synephrine.  After adequate anesthesia is achieved, the nasal cavity is examined with a 0 rigid endoscope.  A suction catheter is inserted in parallel with the 0 endoscope, and it is used to suction blood clots from the right nasal cavity.  Hypervascular areas are noted at the anterior and superior aspect of the nasal septum.  A silver nitrate stick is inserted in parallel with the 0 endoscope.  It is used to repeatedly cauterized the hypervascular areas.  Good hemostasis is achieved.  The patient tolerated the procedure well.  Assessment: 1.  Hypervascular areas are noted on the right anterior and superior nasal septum.   2.  Active bleeding is noted today.    Plan: 1. Endoscopic cauterization of the right anterior and superior nasal septum. 2. The nasal endoscopy findings are reviewed with the patient. 3. Nasal ointment/humidifier to treat the nasal dryness. 4. The patient will return for re-evaluation in 1 month.

## 2023-09-09 ENCOUNTER — Other Ambulatory Visit (HOSPITAL_COMMUNITY): Payer: Self-pay

## 2023-09-18 NOTE — Progress Notes (Unsigned)
  Electrophysiology Office Note:   Date:  09/19/2023  ID:  Linda Olson, DOB [birth year], MRN [id number]  Primary Cardiologist: None Primary Heart Failure: None Electrophysiologist: Eulas FORBES Furbish, MD      History of Present Illness:   Linda Olson is a 63 y.o. female with h/o AF, HTN, CAD, DM II, thoracic aortic aneurysm seen today for routine electrophysiology follow-up s/p Ablation.  Since last being seen in our clinic the patient reports doing very well. No recurrent AF of which she is aware. Otherwise,  she denies chest pain, dyspnea, PND, orthopnea, nausea, vomiting, dizziness, syncope, edema, weight gain, or early satiety.   She has rare fleeting palpitations, especially when lying down at night. Nothing sustained.   Review of systems complete and found to be negative unless listed in HPI.   EP Information / Studies Reviewed:    EKG is ordered today. Personal review as below.  EKG Interpretation Date/Time:  Thursday September 19 2023 12:25:41 EDT Ventricular Rate:  61 PR Interval:  162 QRS Duration:  80 QT Interval:  390 QTC Calculation: 392 R Axis:   -14  Text Interpretation: Normal sinus rhythm Normal ECG When compared with ECG of 01-Jul-2023 15:03, No significant change was found Confirmed by Olson Linda [phone]) on 09/19/2023 12:29:45 PM   Arrhythmia/Device History S/p PVI and posterior wall ablation 06/03/2023    Physical Exam:   VS:  BP 124/82   Pulse 61   Ht 5' (1.524 m)   Wt 213 lb 14.4 oz (97 kg)   SpO2 98%   BMI 41.77 kg/m    Wt Readings from Last 3 Encounters:  09/19/23 213 lb 14.4 oz (97 kg)  08/27/23 205 lb (93 kg)  07/22/23 208 lb (94.3 kg)     GEN: Well nourished, well developed in no acute distress NECK: No JVD; No carotid bruits CARDIAC: Regular rate and rhythm, no murmurs, rubs, gallops RESPIRATORY:  Clear to auscultation without rales, wheezing or rhonchi  ABDOMEN: Soft, non-tender, non-distended EXTREMITIES:  No edema; No  deformity   ASSESSMENT AND PLAN:    Paroxysmal Atrial Fibrillation  CHA2DS2-VASc 4, s/p ablation 06/03/23 Will need to continue Xarelto  20 mg due to elevated risk score.  -continue Lopressor  25mg  BID PRN   Secondary hypercoagulable state  Pt on Xarelto  as above   HTN Stable on current regimen   Obesity Body mass index is 41.77 kg/m.  Encouraged lifestyle modification   Follow up with EP Team in 12 months. Sooner with issues.   Signed, Linda Jodie Lesia, PA-C.  09/19/2023 12:30 PM

## 2023-09-19 ENCOUNTER — Ambulatory Visit: Attending: Student | Admitting: Student

## 2023-09-19 ENCOUNTER — Encounter: Payer: Self-pay | Admitting: Student

## 2023-09-19 VITALS — BP 124/82 | HR 61 | Ht 60.0 in | Wt 213.9 lb

## 2023-09-19 DIAGNOSIS — D6869 Other thrombophilia: Secondary | ICD-10-CM

## 2023-09-19 DIAGNOSIS — I10 Essential (primary) hypertension: Secondary | ICD-10-CM

## 2023-09-19 DIAGNOSIS — I480 Paroxysmal atrial fibrillation: Secondary | ICD-10-CM

## 2023-09-19 NOTE — Patient Instructions (Signed)
 Medication Instructions:  Your physician recommends that you continue on your current medications as directed. Please refer to the Current Medication list given to you today.  *If you need a refill on your cardiac medications before your next appointment, please call your pharmacy*  Lab Work: None ordered If you have labs (blood work) drawn today and your tests are completely normal, you will receive your results only by: MyChart Message (if you have MyChart) OR A paper copy in the mail If you have any lab test that is abnormal or we need to change your treatment, we will call you to review the results.  Follow-Up: At [facility], you and your health needs are our priority.  As part of our continuing mission to provide you with exceptional heart care, our providers are all part of one team.  This team includes your primary Cardiologist (physician) and Advanced Practice Providers or APPs (Physician Assistants and Nurse Practitioners) who all work together to provide you with the care you need, when you need it.  Your next appointment:   1 year(s)  Provider:   You may see Eulas FORBES Furbish, MD or one of the following Advanced Practice Providers on your designated Care Team:   Charlies Arthur, [place] Ozell Jodie Passey, PA-C Suzann Riddle, NP Daphne Barrack, NP

## 2023-09-29 NOTE — Progress Notes (Unsigned)
 SUBJECTIVE: Discussed the use of AI scribe software for clinical note transcription with the patient, who gave verbal consent to proceed.  Chief Complaint: Obesity  Interim History: She is down 3 lbs since her last visit.  Down 35 lbs overall TBW loss of 14.8%  Linda Olson is here to discuss her progress with her obesity treatment plan. She is on the Category 2 Plan and states she is following her eating plan approximately 75 % of the time. She states she is exercising 10 minutes 3 times per week.  Linda Olson reports good adherence to treatment plan Linda Olson has been working on reading food labels, not skipping meals, increasing protein intake at every meal, drinking more water, avoiding or reducing simple and processed carbohydrates, making healthier choices, begun to exercise, working on meal prepping, reducing portion sizes, mindfulness around eating, controlling orexigenic cues and stimuli, incorporating more whole foods, and increasing physical activity levels   Barriers identified: lack of time for self-care, work schedule, medical comorbidities, moderate to high levels of stress, and menopause.   Pharmacotherapy for weight loss: She is currently taking Monjauro with diabetes as the primary indication with adequate clinical response  and without side effects.. Denies mass in neck, dysphagia, dyspepsia, persistent hoarseness, abdominal pain, or N/V/Constipation or diarrhea. Has annual eye exam. Mood is stable.   Fasting Labs obtained today.  The patient was informed we would discuss the lab results at the next visit unless there is a critical issue that needs to be addressed sooner. The patient agreed to keep the next visit at the agreed upon time to discuss these results.    OBJECTIVE: Visit Diagnoses: Problem List Items Addressed This Visit     Hypertension associated with type 2 diabetes mellitus (HCC)   Relevant Medications   tirzepatide  (MOUNJARO ) 12.5 MG/0.5ML Pen   Vitamin  D deficiency   Relevant Orders   VITAMIN D  25 Hydroxy (Vit-D Deficiency, Fractures)   Obesity, Beginning BMI 46.29   Relevant Medications   tirzepatide  (MOUNJARO ) 12.5 MG/0.5ML Pen   Diabetes mellitus (HCC) - Primary (Chronic)   Relevant Medications   tirzepatide  (MOUNJARO ) 12.5 MG/0.5ML Pen   Other Relevant Orders   CMP14+EGFR   Hemoglobin A1c   Insulin , random   Other Visit Diagnoses       Hyperlipidemia associated with type 2 diabetes mellitus (HCC)       Relevant Medications   tirzepatide  (MOUNJARO ) 12.5 MG/0.5ML Pen   Other Relevant Orders   Lipid Panel With LDL/HDL Ratio     Other fatigue       Relevant Orders   Vitamin B12     1. Type 2 diabetes mellitus with other specified complication, without long-term current use of insulin  (HCC) (Primary) On Mounjaro  12.5 mg weekly without side effects. Some mild decreased efficacy at the end of dosing interval.  Lab Results  Component Value Date   HGBA1C 5.8 (H) 05/15/2023   HGBA1C 5.8 (H) 01/01/2023   HGBA1C 6.0 (H) 12/20/2021   Lab Results  Component Value Date   LDLCALC 112 (H) 05/15/2023   CREATININE 0.90 05/15/2023   Plan:  Continue Mounjaro  12.5 mg weekly.  - CMP14+EGFR - Hemoglobin A1c - Insulin , random - tirzepatide  (MOUNJARO ) 12.5 MG/0.5ML Pen; Inject 12.5 mg into the skin once a week.  Dispense: 2 mL; Refill: 1  2. Hypertension associated with type 2 diabetes mellitus (HCC) On losartan  hydrochlorothiazide  50-12.5 mg daily. No SE.  Lab Results  Component Value Date   NA 139 05/15/2023   CL  100 05/15/2023   K 4.5 05/15/2023   CO2 24 05/15/2023   BUN 11 05/15/2023   CREATININE 0.90 05/15/2023   EGFR 73 05/15/2023   CALCIUM  9.9 05/15/2023   ALBUMIN 4.1 05/15/2023   GLUCOSE 67 (L) 05/15/2023   Plan:  Continue losartan -hydrochlorothiazide  50-12.5 mg daily.  Continue to work on nutrition plan to promote weight loss and improve BP control.  Recheck labs today.   3. Hyperlipidemia associated with type 2  diabetes mellitus (HCC) On Lipitor 40 mg daily. Reports no SE.  Lab Results  Component Value Date   CHOL 183 05/15/2023   CHOL 178 01/01/2023   CHOL 235 (H) 12/20/2021   Lab Results  Component Value Date   HDL 59 05/15/2023   HDL 51 01/01/2023   HDL 57 12/20/2021   Lab Results  Component Value Date   LDLCALC 112 (H) 05/15/2023   LDLCALC 113 (H) 01/01/2023   LDLCALC 165 (H) 12/20/2021   Lab Results  Component Value Date   TRIG 63 05/15/2023   TRIG 77 01/01/2023   TRIG 77 12/20/2021   Lab Results  Component Value Date   CHOLHDL 4.3 03/30/2021   CHOLHDL 3.8 08/09/2020   CHOLHDL 3.9 04/29/2020   No results found for: LDLDIRECT  Plan:  Continue Lipitor 40 mg daily.  Continue to work on nutrition plan -decreasing simple carbohydrates, increasing lean proteins, decreasing saturated fats and cholesterol , avoiding trans fats and exercise as able to promote weight loss, improve lipids and decrease cardiovascular risks. - Lipid Panel With LDL/HDL Ratio  4. Vitamin D  deficiency Not currently supplementing with vitamin D .  Last vitamin D  Lab Results  Component Value Date   VD25OH 49.8 05/15/2023   Plan:  Recheck - VITAMIN D  25 Hydroxy (Vit-D Deficiency, Fractures)  5. Other fatigue Very busy life style. Some fatigue at times.  Lastb12  Recheck- Vitamin B12  6. Obesity, Beginning BMI 46.29 Linda Olson has done well overall with weight loss.  Down 35 lbs for TBW loss of 14.8% Muscle mass  + 1.5 lbs and Adipose mass - 5 lbs since last visit as has incorporated more activities during her work day.  Continue current Cat 2 nutrition plan Continue to incorporate increased activity during work day.  Follow up in 4 weeks.   Vitals Temp: 98.5 F (36.9 C) BP: 133/88 Pulse Rate: 64 SpO2: 100 %   Anthropometric Measurements Height: 5' (1.524 m) Weight: 202 lb (91.6 kg) BMI (Calculated): 39.45 Weight at Last Visit: 205 lb Weight Lost Since Last Visit: 3 lb Weight  Gained Since Last Visit: 0 Starting Weight: 237 lb Total Weight Loss (lbs): 35 lb (15.9 kg) Peak Weight: 270 lb   Body Composition  Body Fat %: 46.1 % Fat Mass (lbs): 93.4 lbs Muscle Mass (lbs): 103.8 lbs Total Body Water (lbs): 76.8 lbs Visceral Fat Rating : 15   Other Clinical Data Fasting: Yes Labs: Yes Today's Visit #: 36 Starting Date: 04/28/20     ASSESSMENT AND PLAN:  Diet: Linda Olson is currently in the action stage of change. As such, her goal is to continue with weight loss efforts. She has agreed to Category 2 Plan.  Exercise: Linda Olson has been instructed to work up to a goal of 150 minutes of combined cardio and strengthening exercise per week for weight loss and overall health benefits.   Behavior Modification:  We discussed the following Behavioral Modification Strategies today: increasing lean protein intake, decreasing simple carbohydrates, increasing vegetables, increase H2O intake, increase high fiber  foods, no skipping meals, meal planning and cooking strategies, avoiding temptations, and planning for success. We discussed various medication options to help Linda Olson with her weight loss efforts and we both agreed to continue current treatment plan.  Return in about 4 weeks (around 10/28/2023).SABRA She was informed of the importance of frequent follow up visits to maximize her success with intensive lifestyle modifications for her multiple health conditions.  Attestation Statements:   Reviewed by clinician on day of visit: allergies, medications, problem list, medical history, surgical history, family history, social history, and previous encounter notes.   Time spent on visit including pre-visit chart review and post-visit care and charting was 33 minutes.    Slaton Reaser, PA-C

## 2023-09-30 ENCOUNTER — Ambulatory Visit (INDEPENDENT_AMBULATORY_CARE_PROVIDER_SITE_OTHER): Admitting: Physician Assistant

## 2023-09-30 ENCOUNTER — Other Ambulatory Visit (HOSPITAL_COMMUNITY): Payer: Self-pay

## 2023-09-30 ENCOUNTER — Encounter (INDEPENDENT_AMBULATORY_CARE_PROVIDER_SITE_OTHER): Payer: Self-pay | Admitting: Physician Assistant

## 2023-09-30 VITALS — BP 133/88 | HR 64 | Temp 98.5°F | Ht 60.0 in | Wt 202.0 lb

## 2023-09-30 DIAGNOSIS — I152 Hypertension secondary to endocrine disorders: Secondary | ICD-10-CM

## 2023-09-30 DIAGNOSIS — E669 Obesity, unspecified: Secondary | ICD-10-CM

## 2023-09-30 DIAGNOSIS — E559 Vitamin D deficiency, unspecified: Secondary | ICD-10-CM | POA: Diagnosis not present

## 2023-09-30 DIAGNOSIS — Z7985 Long-term (current) use of injectable non-insulin antidiabetic drugs: Secondary | ICD-10-CM

## 2023-09-30 DIAGNOSIS — E785 Hyperlipidemia, unspecified: Secondary | ICD-10-CM

## 2023-09-30 DIAGNOSIS — E1159 Type 2 diabetes mellitus with other circulatory complications: Secondary | ICD-10-CM

## 2023-09-30 DIAGNOSIS — Z6839 Body mass index (BMI) 39.0-39.9, adult: Secondary | ICD-10-CM

## 2023-09-30 DIAGNOSIS — R5383 Other fatigue: Secondary | ICD-10-CM | POA: Diagnosis not present

## 2023-09-30 DIAGNOSIS — E1169 Type 2 diabetes mellitus with other specified complication: Secondary | ICD-10-CM

## 2023-09-30 MED ORDER — TIRZEPATIDE 12.5 MG/0.5ML ~~LOC~~ SOAJ
12.5000 mg | SUBCUTANEOUS | 1 refills | Status: DC
  Filled 2023-09-30: qty 2, 28d supply, fill #0

## 2023-10-01 ENCOUNTER — Other Ambulatory Visit (HOSPITAL_COMMUNITY): Payer: Self-pay

## 2023-10-02 LAB — LIPID PANEL WITH LDL/HDL RATIO
Cholesterol, Total: 185 mg/dL (ref 100–199)
HDL: 58 mg/dL (ref >39)
LDL Chol Calc (NIH): 117 mg/dL — ABNORMAL HIGH (ref 0–99)
LDL/HDL Ratio: 2 ratio (ref 0.0–3.2)
Triglycerides: 51 mg/dL (ref 0–149)
VLDL Cholesterol Cal: 10 mg/dL (ref 5–40)

## 2023-10-02 LAB — CMP14+EGFR
ALT: 27 IU/L (ref 0–32)
AST: 30 IU/L (ref 0–40)
Albumin: 4 g/dL (ref 3.8–4.9)
Alkaline Phosphatase: 119 IU/L (ref 44–121)
BUN/Creatinine Ratio: 19 (ref 12–28)
BUN: 16 mg/dL (ref 8–27)
Bilirubin Total: 0.4 mg/dL (ref 0.0–1.2)
CO2: 19 mmol/L — ABNORMAL LOW (ref 20–29)
Calcium: 9.5 mg/dL (ref 8.7–10.3)
Chloride: 105 mmol/L (ref 96–106)
Creatinine, Ser: 0.86 mg/dL (ref 0.57–1.00)
Globulin, Total: 3.1 g/dL (ref 1.5–4.5)
Glucose: 92 mg/dL (ref 70–99)
Potassium: 4.5 mmol/L (ref 3.5–5.2)
Sodium: 141 mmol/L (ref 134–144)
Total Protein: 7.1 g/dL (ref 6.0–8.5)
eGFR: 77 mL/min/1.73 (ref >59)

## 2023-10-02 LAB — VITAMIN D 25 HYDROXY (VIT D DEFICIENCY, FRACTURES): Vit D, 25-Hydroxy: 45.1 ng/mL (ref 30.0–100.0)

## 2023-10-02 LAB — HEMOGLOBIN A1C
Est. average glucose Bld gHb Est-mCnc: 120 mg/dL
Hgb A1c MFr Bld: 5.8 % — ABNORMAL HIGH (ref 4.8–5.6)

## 2023-10-02 LAB — INSULIN, RANDOM: INSULIN: 19.3 u[IU]/mL (ref 2.6–24.9)

## 2023-10-02 LAB — VITAMIN B12: Vitamin B-12: 850 pg/mL (ref 232–1245)

## 2023-10-09 ENCOUNTER — Ambulatory Visit (INDEPENDENT_AMBULATORY_CARE_PROVIDER_SITE_OTHER): Admitting: Otolaryngology

## 2023-10-30 ENCOUNTER — Other Ambulatory Visit (HOSPITAL_COMMUNITY): Payer: Self-pay

## 2023-10-30 ENCOUNTER — Ambulatory Visit (INDEPENDENT_AMBULATORY_CARE_PROVIDER_SITE_OTHER): Admitting: Physician Assistant

## 2023-10-30 VITALS — BP 103/70 | HR 61 | Temp 98.0°F | Ht 60.0 in | Wt 205.0 lb

## 2023-10-30 DIAGNOSIS — Z7985 Long-term (current) use of injectable non-insulin antidiabetic drugs: Secondary | ICD-10-CM

## 2023-10-30 DIAGNOSIS — E1159 Type 2 diabetes mellitus with other circulatory complications: Secondary | ICD-10-CM | POA: Diagnosis not present

## 2023-10-30 DIAGNOSIS — R4589 Other symptoms and signs involving emotional state: Secondary | ICD-10-CM

## 2023-10-30 DIAGNOSIS — I152 Hypertension secondary to endocrine disorders: Secondary | ICD-10-CM | POA: Diagnosis not present

## 2023-10-30 DIAGNOSIS — E1169 Type 2 diabetes mellitus with other specified complication: Secondary | ICD-10-CM

## 2023-10-30 DIAGNOSIS — Z6841 Body Mass Index (BMI) 40.0 and over, adult: Secondary | ICD-10-CM

## 2023-10-30 DIAGNOSIS — E559 Vitamin D deficiency, unspecified: Secondary | ICD-10-CM

## 2023-10-30 DIAGNOSIS — F439 Reaction to severe stress, unspecified: Secondary | ICD-10-CM

## 2023-10-30 DIAGNOSIS — F4329 Adjustment disorder with other symptoms: Secondary | ICD-10-CM

## 2023-10-30 DIAGNOSIS — E785 Hyperlipidemia, unspecified: Secondary | ICD-10-CM

## 2023-10-30 MED ORDER — TIRZEPATIDE 15 MG/0.5ML ~~LOC~~ SOAJ
15.0000 mg | SUBCUTANEOUS | 1 refills | Status: DC
  Filled 2023-10-30: qty 2, 28d supply, fill #0
  Filled 2023-11-22: qty 2, 28d supply, fill #1

## 2023-10-30 NOTE — Progress Notes (Unsigned)
 SUBJECTIVE: Discussed the use of AI scribe software for clinical note transcription with the patient, who gave verbal consent to proceed.  Chief Complaint: Obesity  Interim History: She is up 3 lbs since last visit Down 32 lbs overall TBW loss of 13.5%  Linda Olson is here to discuss her progress with her obesity treatment plan. She is on the Category 2 Plan and states she is following her eating plan approximately 60 % of the time. She states she is exercising 0 minutes 0 times per week.  Linda Olson is a 63 year old female with obesity, type 2 diabetes, hypertension, and hyperlipidemia who presents for follow-up of her obesity treatment plan.  She has been on Mounjaro  12.5 mg weekly but has gained three pounds over the last three weeks. She attributes this to increased stress and changes in her routine, including frequent visits to the hospital due to her sister's illness and recent job loss. She has been more active, running up and down the hospital, and has been eating convenience foods and indulging during her birthday and a beach trip with her grandchildren.  Her type 2 diabetes is being managed with Mounjaro . Recent lab results showed her A1c is in a good range.  Her social history includes recent job loss after 29 years, which has been a significant stressor. 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 She has been feeling sad about her job loss and acknowledges a loss of motivation.  She ensures adequate hydration, keeping water accessible at home and in her car. Her sleep schedule has been slightly disrupted, but she generally sleeps well, going to bed around 9:30-10 PM and waking around 5-7 AM.  No nausea, vomiting, constipation, or diarrhea with her current medication. She reports feeling sad about her job loss and acknowledges a loss of motivation. OBJECTIVE: Visit Diagnoses: Problem List Items Addressed This  Visit       Cardiovascular and Mediastinum   Hypertension associated with type 2 diabetes mellitus (HCC)     Endocrine   Diabetes mellitus (HCC) - Primary (Chronic)     Other   Vitamin D  deficiency   Obesity, Beginning BMI 46.29   Other Visit Diagnoses       Hyperlipidemia associated with type 2 diabetes mellitus (HCC)         Obesity Currently on Mounjaro  12.5 mg weekly for primary indication of T2DM and for weight management. Has had a weight gain of three pounds over the last three weeks, attributed to increased muscle mass and adipose tissue.  Stress from her sister's hospitalization and job loss may have contributed to weight gain. Expressed interest in increasing Mounjaro  dose as current dose is perceived as ineffective. Informed about potential side effects of increased dose, including nausea and constipation, and advised to stay hydrated and monitor body's response. - Increase Mounjaro  to 15 mg weekly, provide one refill. - Advise to stay hydrated, especially after the first dose of the increased Mounjaro . - Counsel on potential side effects of increased Mounjaro  dose, including nausea and constipation. - Encourage refocusing on weight management plan and maintaining a healthy diet. - Advise taking leisurely walks to reduce stress and focus on self-care.  Type 2 diabetes mellitus, well controlled Type 2 diabetes is well controlled, with blood sugar levels in the prediabetic range. Improvement attributed to Mounjaro  enhancing insulin  function. - Continue/increase Mounjaro  to 15 mg weekly - Encourage adherence to dietary plan to  maintain blood sugar control. Meds ordered this encounter  Medications   tirzepatide  (MOUNJARO ) 15 MG/0.5ML Pen    Sig: Inject 15 mg into the skin once a week.    Dispense:  2 mL    Refill:  1    Essential hypertension Blood pressure is currently well controlled.Losartan -hydrochlorothiazide  50-12.5 mg daily.  No SE reported.  BP Readings from  Last 3 Encounters:  10/30/23 103/70  09/30/23 133/88  09/19/23 124/82   Lab Results  Component Value Date   NA 141 09/30/2023   CL 105 09/30/2023   K 4.5 09/30/2023   CO2 19 (L) 09/30/2023   BUN 16 09/30/2023   CREATININE 0.86 09/30/2023   EGFR 77 09/30/2023   CALCIUM  9.5 09/30/2023   ALBUMIN 4.0 09/30/2023   GLUCOSE 92 09/30/2023   Plan:  Continue to work on nutrition plan to promote weight loss and improve BP control.  Continue Losartan -hydrochlorothiazide  50-12.5 mg daily.   Hyperlipidemia LDL levels slightly elevated in recent lab results. Plan to monitor these levels, especially with the increase in Mounjaro  dosage, which may help improve lipid profile. Lab Results  Component Value Date   CHOL 185 09/30/2023   CHOL 183 05/15/2023   CHOL 178 01/01/2023   Lab Results  Component Value Date   HDL 58 09/30/2023   HDL 59 05/15/2023   HDL 51 01/01/2023   Lab Results  Component Value Date   LDLCALC 117 (H) 09/30/2023   LDLCALC 112 (H) 05/15/2023   LDLCALC 113 (H) 01/01/2023   Lab Results  Component Value Date   TRIG 51 09/30/2023   TRIG 63 05/15/2023   TRIG 77 01/01/2023   Lab Results  Component Value Date   CHOLHDL 4.3 03/30/2021   CHOLHDL 3.8 08/09/2020   CHOLHDL 3.9 04/29/2020   No results found for: LDLDIRECT Continue to work on nutrition plan -decreasing simple carbohydrates, increasing lean proteins, decreasing saturated fats and cholesterol , avoiding trans fats and exercise as able to promote weight loss, improve lipids and decrease cardiovascular risks. - Monitor LDL levels in a few months to assess any changes with the increased Mounjaro  dose in 2-3 months,   Depressed mood Reports feeling sad and lacking motivation due to recent job loss after 29 years. Discussed option of starting bupropion, an antidepressant that can help with cravings and provide activating energy. Declined starting medication at this time, stating she will be fine and will  consider it if needed in the future. No SI/HI or other concerns.  - Consider bupropion if she feels she needs help with her mood or motivation in the future.  Stress Currently dealing with stress from her sister's hospitalization and job loss. Considering a job that allows her to work from home and care for her sister, which may help focus on her own health and well-being. She would like to hold off considering medications for now.  - Encourage taking leisurely walks to reduce stress. - Advise maintaining a healthy lifestyle and focusing on self-care.  Follow-Up Scheduled for a follow-up appointment on Tuesday, November 26, 2023, at 7:30 AM. Plan to monitor progress with the increased Mounjaro  dose and may consider obtaining another set of lab tests in a few months to assess insulin  levels and overall health. - Schedule follow-up appointment for Tuesday, November 26, 2023, at 7:30 AM. - Consider obtaining another set of lab tests in a few months to monitor progress.  Vitals Temp: 98 F (36.7 C) BP: 103/70 Pulse Rate: 61 SpO2: 99 %  Anthropometric Measurements Height: 5' (1.524 m) Weight: 205 lb (93 kg) BMI (Calculated): 40.04 Weight at Last Visit: 202lb Weight Lost Since Last Visit: 0lb Weight Gained Since Last Visit: 3lb Starting Weight: 237lb Total Weight Loss (lbs): 32 lb (14.5 kg) Peak Weight: 270lb   Body Composition  Body Fat %: 46.7 % Fat Mass (lbs): 96.2 lbs Muscle Mass (lbs): 104 lbs Total Body Water (lbs): 76.4 lbs Visceral Fat Rating : 15   Other Clinical Data Fasting: no Labs: no Today's Visit #: 39 Starting Date: 04/28/20     ASSESSMENT AND PLAN:  Diet: Kennedee is currently in the action stage of change. As such, her goal is to continue with weight loss efforts. She has agreed to Category 2 Plan.  Exercise: Mattingly has been instructed to work up to a goal of 150 minutes of combined cardio and strengthening exercise per week for weight loss  and overall health benefits.   Behavior Modification:  We discussed the following Behavioral Modification Strategies today: increasing lean protein intake, decreasing simple carbohydrates, increasing vegetables, increase H2O intake, increase high fiber foods, no skipping meals, meal planning and cooking strategies, emotional eating strategies , avoiding temptations, and planning for success. We discussed various medication options to help Kennisha with her weight loss efforts and we both agreed to increase Mounjaro  to 15 mg weekly and otherwise continue current treatment plan.  Follow up in 4 weeks.  She was informed of the importance of frequent follow up visits to maximize her success with intensive lifestyle modifications for her multiple health conditions.  Attestation Statements:   Reviewed by clinician on day of visit: allergies, medications, problem list, medical history, surgical history, family history, social history, and previous encounter notes.   Time spent on visit including pre-visit chart review and post-visit care and charting was 22 minutes.    Linda Vanhoose,Linda Olson

## 2023-11-01 ENCOUNTER — Encounter (INDEPENDENT_AMBULATORY_CARE_PROVIDER_SITE_OTHER): Payer: Self-pay | Admitting: Physician Assistant

## 2023-11-22 DIAGNOSIS — H40023 Open angle with borderline findings, high risk, bilateral: Secondary | ICD-10-CM | POA: Diagnosis not present

## 2023-11-25 NOTE — Progress Notes (Unsigned)
 SUBJECTIVE: Discussed the use of AI scribe software for clinical note transcription with the patient, who gave verbal consent to proceed.  Chief Complaint: Obesity  Interim History: She is up 3 lbs since her last visit Down 29 lbs overall TBW loss of 12.2%  Anaiah is here to discuss her progress with her obesity treatment plan. She is on the Category 2 Plan and states she is following her eating plan approximately 25 % of the time. She states she is not exercising .   GERRIE CASTIGLIA is a 63 year old female who presents for follow-up of her obesity treatment plan.  She is experiencing challenges in maintaining a consistent eating schedule due to stress and caregiving responsibilities for her sister. Her typical breakfast consists of grits, and she often delays lunch until mid-afternoon. She snacks on bananas and grapes, and has been consuming muscadine grapes, which may have contributed to her weight gain.  She has a history of type 2 diabetes, hypertension, hyperlipidemia, vitamin D  deficiency, and paroxysmal atrial fibrillation. She is currently taking Mounjaro  for her daibetes, which initially caused some stomach upset. She takes Hyzaar  for hypertension and has recently started taking vitamin pills and an energy drink with vegetables for energy.  She reports difficulty sleeping, particularly in the first two weeks of caring for her sister, as she was worried about not hearing her sister call for help. Her sister is on CPAP and is now sleeping better, which has allowed her to get some rest, although she still feels tired. She is concerned about her sister's ability to get out of bed independently and is vigilant about her sister's needs, which impacts her own rest. Her mother also has surgery pending for removal of 1 of her kidneys which has her stressed as well.  She is trying to improve her protein intake and has started buying yogurt again. She eats ice cream, including [place], but  also indulges in other types, which are not ideal for her diet. She needs to increase her calorie and protein intake to meet her dietary goals. OBJECTIVE: Visit Diagnoses: Problem List Items Addressed This Visit     Hypertension associated with type 2 diabetes mellitus (HCC)   Relevant Medications   tirzepatide  (MOUNJARO ) 15 MG/0.5ML Pen   Vitamin D  deficiency   Obesity, Beginning BMI 46.29   Relevant Medications   tirzepatide  (MOUNJARO ) 15 MG/0.5ML Pen   Diabetes mellitus (HCC) - Primary (Chronic)   Relevant Medications   tirzepatide  (MOUNJARO ) 15 MG/0.5ML Pen   Other Visit Diagnoses       Hyperlipidemia associated with type 2 diabetes mellitus (HCC)       Relevant Medications   tirzepatide  (MOUNJARO ) 15 MG/0.5ML Pen     Depressed mood         Stress and adjustment reaction         Obesity Weight has increased slightly since July, likely due to dietary choices and stress. Adipose percentage remains stable. - Encourage consumption of 1200 calories and 85 grams of protein daily. - Advise increased calorie and protein intake at breakfast, a smaller meal for lunch, and a balanced dinner. - Recommend avoiding high glycemic index fruits like bananas and grapes; suggest strawberries and blueberries instead. - Refill Mounjaro  prescription.  Type 2 diabetes mellitus Dietary choices, including high glycemic index fruits, may impact blood glucose control. Inconsistent meal patterns may affect glucose levels.On Mounjaro  15 mg weekly.  Denies mass in neck, dysphagia, dyspepsia, persistent hoarseness, abdominal pain, or N/V/Constipation or diarrhea. Has  annual eye exam. Mood is stable.   Lab Results  Component Value Date   HGBA1C 5.8 (H) 09/30/2023   HGBA1C 5.8 (H) 05/15/2023   HGBA1C 5.8 (H) 01/01/2023   Lab Results  Component Value Date   LDLCALC 117 (H) 09/30/2023   CREATININE 0.86 09/30/2023   A1C is stable.  - Advise on dietary modifications to include low glycemic index  fruits. - Encourage regular meal patterns to stabilize blood glucose levels. Meds ordered this encounter  Medications   tirzepatide  (MOUNJARO ) 15 MG/0.5ML Pen    Sig: Inject 15 mg into the skin once a week.    Dispense:  2 mL    Refill:  1    Caregiver stress Significant stress due to caregiving responsibilities for sister, impacting dietary habits and sleep. Awaiting paperwork for potential caregiving compensation, which may alleviate stress. - Encourage self-care and prioritizing personal health needs. - She is working on potential for receiving compensation for caregiving duties for her sister.  Insomnia Difficulty sleeping due to stress and caregiving responsibilities. Sleep is interrupted by concern for sister's needs. - Encourage establishing a regular sleep routine and finding opportunities to rest when possible. Monitor .    Vitals Temp: 98.3 F (36.8 C) BP: 120/79 Pulse Rate: 73 SpO2: 99 %   Anthropometric Measurements Height: 5' (1.524 m) Weight: 208 lb (94.3 kg) BMI (Calculated): 40.62 Weight at Last Visit: 205 lb Weight Lost Since Last Visit: 0 Weight Gained Since Last Visit: 3 lb Starting Weight: 237 lb Total Weight Loss (lbs): 29 lb (13.2 kg) Peak Weight: 270 lb   Body Composition  Body Fat %: 46.9 % Fat Mass (lbs): 97.8 lbs Muscle Mass (lbs): 105 lbs Total Body Water (lbs): 77.4 lbs Visceral Fat Rating : 15   Other Clinical Data Fasting: No Labs: No Today's Visit #: 40 Starting Date: 04/28/20     ASSESSMENT AND PLAN:  Diet: Zylpha is currently in the action stage of change. As such, her goal is to get back to weight loss efforts. She has agreed to Category 2 Plan.  Exercise: Chrisa has been instructed to work up to a goal of 150 minutes of combined cardio and strengthening exercise per week, to try a geriatric exercise plan, and that some exercise is better than none for weight loss and overall health benefits.   Behavior  Modification:  We discussed the following Behavioral Modification Strategies today: increasing lean protein intake, decreasing simple carbohydrates, increasing vegetables, increase H2O intake, increase high fiber foods, no skipping meals, meal planning and cooking strategies, emotional eating strategies , avoiding temptations, and planning for success. We discussed various medication options to help Shimeka with her weight loss efforts and we both agreed to continue current treatment plan and get back on track with nutrition plan.  Return in about 4 weeks (around 12/24/2023).SABRA She was informed of the importance of frequent follow up visits to maximize her success with intensive lifestyle modifications for her multiple health conditions.  Attestation Statements:   Reviewed by clinician on day of visit: allergies, medications, problem list, medical history, surgical history, family history, social history, and previous encounter notes.   Time spent on visit including pre-visit chart review and post-visit care and charting was 28 minutes.    Hoby Kawai, PA-C

## 2023-11-26 ENCOUNTER — Other Ambulatory Visit (HOSPITAL_COMMUNITY): Payer: Self-pay

## 2023-11-26 ENCOUNTER — Ambulatory Visit (INDEPENDENT_AMBULATORY_CARE_PROVIDER_SITE_OTHER): Admitting: Physician Assistant

## 2023-11-26 ENCOUNTER — Encounter (INDEPENDENT_AMBULATORY_CARE_PROVIDER_SITE_OTHER): Payer: Self-pay | Admitting: Physician Assistant

## 2023-11-26 VITALS — BP 120/79 | HR 73 | Temp 98.3°F | Ht 60.0 in | Wt 208.0 lb

## 2023-11-26 DIAGNOSIS — Z6841 Body Mass Index (BMI) 40.0 and over, adult: Secondary | ICD-10-CM

## 2023-11-26 DIAGNOSIS — E785 Hyperlipidemia, unspecified: Secondary | ICD-10-CM

## 2023-11-26 DIAGNOSIS — E1169 Type 2 diabetes mellitus with other specified complication: Secondary | ICD-10-CM

## 2023-11-26 DIAGNOSIS — E559 Vitamin D deficiency, unspecified: Secondary | ICD-10-CM

## 2023-11-26 DIAGNOSIS — I152 Hypertension secondary to endocrine disorders: Secondary | ICD-10-CM

## 2023-11-26 DIAGNOSIS — F4329 Adjustment disorder with other symptoms: Secondary | ICD-10-CM | POA: Diagnosis not present

## 2023-11-26 DIAGNOSIS — G4700 Insomnia, unspecified: Secondary | ICD-10-CM

## 2023-11-26 DIAGNOSIS — R4589 Other symptoms and signs involving emotional state: Secondary | ICD-10-CM

## 2023-11-26 DIAGNOSIS — Z7985 Long-term (current) use of injectable non-insulin antidiabetic drugs: Secondary | ICD-10-CM

## 2023-11-26 DIAGNOSIS — E669 Obesity, unspecified: Secondary | ICD-10-CM

## 2023-11-26 MED ORDER — TIRZEPATIDE 15 MG/0.5ML ~~LOC~~ SOAJ
15.0000 mg | SUBCUTANEOUS | 1 refills | Status: DC
  Filled 2023-12-04: qty 2, 28d supply, fill #0

## 2023-11-28 ENCOUNTER — Telehealth: Payer: Self-pay

## 2023-11-28 DIAGNOSIS — Z7901 Long term (current) use of anticoagulants: Secondary | ICD-10-CM | POA: Diagnosis not present

## 2023-11-28 DIAGNOSIS — I4891 Unspecified atrial fibrillation: Secondary | ICD-10-CM | POA: Diagnosis not present

## 2023-11-28 NOTE — Telephone Encounter (Signed)
   Pre-operative Risk Assessment    Patient Name: Linda Olson  DOB: 10-Nov-1960 MRN: [id number]   Date of last office visit: 09/19/23 OZELL PASSEY, PA-C Date of next office visit: NONE   Request for Surgical Clearance    Procedure:  COLONOSCOPY. ENDOSCOPY  Date of Surgery:  Clearance TBD                                Surgeon:  DR ESTEFANA KEAS Surgeon's Group or Practice Name:  [facility] Phone number:  [phone] Fax number:  [phone]   Type of Clearance Requested:   - Medical  - Pharmacy:  Hold Rivaroxaban  (Xarelto )     Type of Anesthesia:  PROPOFOL    Additional requests/questions:    Signed, Lucie DELENA Ku   11/28/2023, 12:41 PM

## 2023-12-03 ENCOUNTER — Other Ambulatory Visit (HOSPITAL_COMMUNITY): Payer: Self-pay

## 2023-12-04 ENCOUNTER — Other Ambulatory Visit (HOSPITAL_COMMUNITY): Payer: Self-pay

## 2023-12-09 NOTE — Telephone Encounter (Signed)
   Name: Linda Olson  DOB: 1960/03/17  MRN: [id number]   Primary Cardiologist: None  Chart reviewed as part of pre-operative protocol coverage. Linda Olson was last seen on 09/19/2023 by Jodie Passey, PA-C.  Per Jodie, No concerns from a cardiac perspective. The patient is at low risk to proceed without further work up. If the patient has new chest pain or SOB prior to surgery, they should be revaluated.  Therefore, based on ACC/AHA guidelines, the patient would be an acceptable risk for the planned procedure without further cardiovascular testing.   Per Pharm D, patient has not had an Afib/aflutter ablation within the last 3 months, DCCV within the last 4 weeks, or Watchman in the last 45 days. Patient may hold Xarelto  for 2 days prior to procedure.    I will route this recommendation to the requesting party via Epic fax function and remove from pre-op pool. Please call with questions.  Barnie Hila, NP 12/09/2023, 5:04 PM

## 2023-12-09 NOTE — Telephone Encounter (Signed)
 Jodie,   You saw this patient on 7/17. Per office protocol, will you please comment on medical clearance for colonoscopy?  Please route your response to [facility] Preop. I will communicate with requesting office once you have given recommendations.   Thank you!  Barnie Hila, NP

## 2023-12-09 NOTE — Telephone Encounter (Signed)
 Patient with diagnosis of afib on Xarelto  for anticoagulation.    Procedure: COLONOSCOPY. ENDOSCOPY  Date of procedure: TBD   CHA2DS2-VASc Score = 4   This indicates a 4.8% annual risk of stroke. The patient's score is based upon: CHF History: 0 HTN History: 1 Diabetes History: 1 Stroke History: 0 Vascular Disease History: 1 Age Score: 0 Gender Score: 1      CrCl 70 ml/min Platelet count 298  Patient has not had an Afib/aflutter ablation or Watchman within the last 3 months or DCCV within the last 30 days   Per office protocol, patient can hold Xarelto  for 2 days prior to procedure.    **This guidance is not considered finalized until pre-operative APP has relayed final recommendations.**

## 2023-12-22 NOTE — Progress Notes (Unsigned)
 Office: [phone]  /  Fax: [phone]  Weight Summary And Biometrics  Vitals Temp: 97.9 F (36.6 C) BP: 107/71 Pulse Rate: 68 SpO2: 100 %   Anthropometric Measurements Height: 5' (1.524 m) Weight: 206 lb (93.4 kg) BMI (Calculated): 40.23 Weight at Last Visit: 208 lb Weight Lost Since Last Visit: 2 lb Weight Gained Since Last Visit: 0 Starting Weight: 237 lb Total Weight Loss (lbs): 31 lb (14.1 kg) Peak Weight: 270 lb   Body Composition  Body Fat %: 45.9 % Fat Mass (lbs): 95 lbs Muscle Mass (lbs): 106.2 lbs Total Body Water (lbs): 75.8 lbs Visceral Fat Rating : 15    No data recorded Today's Visit #: 41  Starting Date: 04/28/20  Fasting labs next OV  Subjective   Chief Complaint: Obesity  Linda Olson is here to discuss her progress with her obesity treatment plan. She is on the the Category 2 Plan and states she is following her eating plan approximately 50 % of the time. She states she is exercising regularly but plans to try to incorporate exercise consistently again soon.   Weight Progress Since Last Visit:  Linda Olson is a 63 year old female with obesity, type 2 diabetes, hyperlipidemia, and hypertension who presents for follow-up of her obesity treatment plan.  She is following a category two obesity treatment plan but has difficulty adhering to it. She has lost two pounds since her last visit and a total of thirty-one pounds overall. She is eating more whole foods but struggles to consume the recommended amount of protein and fluids. She skips meals and is not currently exercising. Stress from caring for her sister and the recent loss of her job has led to stress eating.  She is currently on Mounjaro  15 mg once a week for type 2 diabetes management. She also takes metoprolol  25 mg twice daily, losartan  hydrochlorothiazide  50/12.5 mg daily, and Lipitor 40 mg daily for hyperlipidemia. No side effects from Mounjaro , such as constipation or  diarrhea, and her bowel movements remain regular.  She has been dealing with numerous stressors, including caring for her sister and her mother's upcoming surgery. Her sister is reportedly doing better. She is also preparing for another sister's knee replacement surgery in December. She is trying to manage her stress and maintain her health by planning meals and incorporating more protein into her diet.  She has started making smoothies in the morning and is trying to ensure she has a proper lunch and dinner. She occasionally snacks on beef jerky to increase her protein intake. She has also begun meal prepping to ensure she has meals ready for the week.  She is considering incorporating more physical activity into her routine, such as walking and home workouts, especially as the weather gets colder. She has some light hand weights and a dumbbell at home and is exploring online workout videos to help her stay active.  She acknowledges the need to increase her water intake, as she has been slacking in this area since losing her job. She used to drink more water while working and is trying to re-establish this habit at home.   Since last office visit she has lost 2 pounds. She reports variable adherence to reduced calorie nutritional plan   Engages in occasional physical activity.  Reports fatigue .  She has been working on reading food labels, not skipping meals, increasing protein intake at every meal, eating more fruits, eating more vegetables, drinking more water, avoiding or reducing simple and  processed carbohydrates, making healthier choices, thinking of starting to exercise, getting adequate sleep, working on meal prepping, reducing portion sizes, getting back on track following recent lapse, mindfulness around eating, and incorporating more whole foods   Challenges affecting patient progress: lack of time for self-care, multiple competing priorities, having difficulty with meal prep and  planning, having difficulty focusing on healthy eating, low volume of physical activity at present , moderate to high levels of stress, emotional eating, and recent lapse in weight management plan due to work, travel, illness or family circumstances.   Orexigenic Control: Denies problems with appetite and hunger signals.  Denies problems with satiety and satiation.  Denies problems with eating patterns and portion control.  Denies abnormal cravings. Denies feeling deprived or restricted.   Pharmacotherapy for weight management: She is currently taking Monjauro with diabetes as the primary indication and obesity secondary with adequate clinical response  and without side effects..   Assessment and Plan   Treatment Plan For Obesity:  Recommended Dietary Goals  Linda Olson is currently in the action stage of change. As such, her goal is to continue weight management plan. She has agreed to: follow the Category 2 plan - 1200 kcal per day and continue current plan  Behavioral Health and Counseling  We discussed the following behavioral modification strategies today: increasing lean protein intake to established goals, decreasing simple carbohydrates , increasing fiber rich foods, avoiding skipping meals, work on meal planning and preparation, decreasing eating out or consumption of processed foods, and making healthy choices when eating convenient foods, work on managing stress, creating time for self-care and relaxation, planning for success, continue to work on maintaining a reduced calorie state, getting the recommended amount of protein, incorporating whole foods, making healthy choices, staying well hydrated and practicing mindfulness when eating., and increase protein intake, fibrous foods (25 grams per day for women, 30 grams for men) and water to improve satiety and decrease hunger signals. .  Additional education and resources provided today: None  Recommended Physical Activity  Goals  Linda Olson has been advised to work up to 150 minutes of moderate intensity aerobic activity a week and strengthening exercises 2-3 times per week for cardiovascular health, weight loss maintenance and preservation of muscle mass.   She has agreed to :  Think about enjoyable ways to increase daily physical activity and overcoming barriers to exercise, Increase physical activity in their day and reduce sedentary time (increase NEAT)., Start aerobic activity with a goal of 150 minutes a week at moderate intensity. , and Combine aerobic and strengthening exercises for efficiency and improved cardiometabolic health.  Pharmacotherapy  We discussed various medication options to help Linda Olson with her weight loss efforts and we both agreed to : Continue with current nutritional and behavioral strategies and do not recommend further increases in GLP-1 due to adequate clinical response   Associated Conditions Impacted by Obesity Treatment   Obesity Following a category two plan but reports difficulty adhering to it. Lost two pounds since last visit and a total of thirty-one pounds overall. Eating more whole foods but not getting the recommended amount of protein or drinking enough liquids. Reports skipping meals and not currently exercising. Stressors, including caring for her sister and recent job loss, have contributed to stress eating. - Encourage meal planning and preparation to ensure adequate protein intake. - Encourage resumption of exercise, suggesting walking or home workouts. - Discuss the importance of hydration and encourage increased water intake.  Type 2 diabetes mellitus Managed with Mounjaro   15 mg once a week. No reported side effects such as constipation or diarrhea. Working on improving diet and exercise regimen to better manage diabetes. - Continue Mounjaro  15 mg once a week. - Plan to repeat labs in one month to assess diabetes control. Meds ordered this encounter   Medications   tirzepatide  (MOUNJARO ) 15 MG/0.5ML Pen    Sig: Inject 15 mg into the skin once a week.    Dispense:  2 mL    Refill:  1   losartan -hydrochlorothiazide  (HYZAAR ) 50-12.5 MG tablet    Sig: Take 1 tablet by mouth daily.    Dispense:  90 tablet    Refill:  0    Hypertension Managed with metoprolol  25 mg BID and losartan  hydrochlorothiazide  50/12.5 mg daily. Encouraged to maintain a healthy lifestyle to aid in blood pressure control. BP Readings from Last 3 Encounters:  12/23/23 107/71  11/26/23 120/79  10/30/23 103/70   Lab Results  Component Value Date   NA 141 09/30/2023   CL 105 09/30/2023   K 4.5 09/30/2023   CO2 19 (L) 09/30/2023   BUN 16 09/30/2023   CREATININE 0.86 09/30/2023   EGFR 77 09/30/2023   CALCIUM  9.5 09/30/2023   ALBUMIN 4.0 09/30/2023   GLUCOSE 92 09/30/2023   No hypotension or symptoms of hypotension.  Plan:  Continue to work on nutrition plan to promote weight loss and improve BP control.  - Refill losartan  hydrochlorothiazide  prescription.  Hyperlipidemia Managed with Lipitor 40 mg daily. Encouraged to continue dietary modifications and exercise to aid in lipid control.  Paroxysmal atrial fibrillation On chronic anticoagulation with Xarelto . No reported issues with medication. Scheduled for a colonoscopy at the end of the month.   Adjustment disorder/stress Experiencing stress due to caring for her sister, her mother's upcoming surgery, and recent job loss. Encouraged to maintain self-care practices to manage stress.  General Health Maintenance Scheduled for a colonoscopy at the end of the month, which is her second one, following the first at age 41. Aware of the preparation process and looking forward to the procedure. - Proceed with scheduled colonoscopy at the end of the month.  Follow-Up To return in one month for follow-up and lab work. Prefers Monday appointments due to her schedule. - Schedule follow-up appointment in one  month on a Monday at 10 AM with fasting labs.  Objective   Physical Exam:  Blood pressure 107/71, pulse 68, temperature 97.9 F (36.6 C), height 5' (1.524 m), weight 206 lb (93.4 kg), SpO2 100%. Body mass index is 40.23 kg/m.  General: She is overweight, cooperative, alert, well developed, and in no acute distress. PSYCH: Has normal mood, affect and thought process.   HEENT: EOMI, sclerae are anicteric. Lungs: Normal breathing effort, no conversational dyspnea. Extremities: No edema.  Neurologic: No gross sensory or motor deficits. No tremors or fasciculations noted.    Diagnostic Data Reviewed:  BMET    Component Value Date/Time   NA 141 09/30/2023 0838   K 4.5 09/30/2023 0838   CL 105 09/30/2023 0838   CO2 19 (L) 09/30/2023 0838   GLUCOSE 92 09/30/2023 0838   GLUCOSE 88 01/14/2023 1310   BUN 16 09/30/2023 0838   CREATININE 0.86 09/30/2023 0838   CALCIUM  9.5 09/30/2023 0838   GFRNONAA >60 01/14/2023 1310   GFRAA 101 04/29/2020 0813   Lab Results  Component Value Date   HGBA1C 5.8 (H) 09/30/2023   HGBA1C 6.2 (H) 03/19/2019   Lab Results  Component Value Date  INSULIN  19.3 09/30/2023   INSULIN  22.1 04/29/2020   Lab Results  Component Value Date   TSH 0.944 05/15/2023   CBC    Component Value Date/Time   WBC 5.2 05/06/2023 1605   WBC 5.3 01/14/2023 1040   RBC 4.39 05/06/2023 1605   RBC 4.34 01/14/2023 1040   HGB 13.0 05/06/2023 1605   HCT 39.7 05/06/2023 1605   PLT 298 05/06/2023 1605   MCV 90 05/06/2023 1605   MCH 29.6 05/06/2023 1605   MCH 28.8 01/14/2023 1040   MCHC 32.7 05/06/2023 1605   MCHC 31.8 01/14/2023 1040   RDW 11.9 05/06/2023 1605   Iron  Studies    Component Value Date/Time   IRON  53 03/30/2021 0755   TIBC 298 03/30/2021 0755   FERRITIN 157 (H) 03/30/2021 0755   IRONPCTSAT 18 03/30/2021 0755   Lipid Panel     Component Value Date/Time   CHOL 185 09/30/2023 0838   TRIG 51 09/30/2023 0838   HDL 58 09/30/2023 0838   CHOLHDL 4.3  03/30/2021 0755   CHOLHDL 3.5 03/19/2019 0505   VLDL 13 03/19/2019 0505   LDLCALC 117 (H) 09/30/2023 0838   Hepatic Function Panel     Component Value Date/Time   PROT 7.1 09/30/2023 0838   ALBUMIN 4.0 09/30/2023 0838   AST 30 09/30/2023 0838   ALT 27 09/30/2023 0838   ALKPHOS 119 09/30/2023 0838   BILITOT 0.4 09/30/2023 0838   BILIDIR <0.1 04/21/2022 2246   IBILI NOT CALCULATED 04/21/2022 2246      Component Value Date/Time   TSH 0.944 05/15/2023 1548   Nutritional Lab Results  Component Value Date   VD25OH 45.1 09/30/2023   VD25OH 49.8 05/15/2023   VD25OH 43.6 01/01/2023    Follow-Up   Return in about 4 weeks (around 01/20/2024).Linda Olson She was informed of the importance of frequent follow up visits to maximize her success with intensive lifestyle modifications for her multiple health conditions.  Attestation Statement   Reviewed by clinician on day of visit: allergies, medications, problem list, medical history, surgical history, family history, social history, and previous encounter notes.     Misty Rago,PA-C

## 2023-12-23 ENCOUNTER — Encounter (INDEPENDENT_AMBULATORY_CARE_PROVIDER_SITE_OTHER): Payer: Self-pay | Admitting: Physician Assistant

## 2023-12-23 ENCOUNTER — Other Ambulatory Visit (HOSPITAL_COMMUNITY): Payer: Self-pay

## 2023-12-23 ENCOUNTER — Ambulatory Visit (INDEPENDENT_AMBULATORY_CARE_PROVIDER_SITE_OTHER): Admitting: Physician Assistant

## 2023-12-23 VITALS — BP 107/71 | HR 68 | Temp 97.9°F | Ht 60.0 in | Wt 206.0 lb

## 2023-12-23 DIAGNOSIS — I10 Essential (primary) hypertension: Secondary | ICD-10-CM | POA: Diagnosis not present

## 2023-12-23 DIAGNOSIS — I480 Paroxysmal atrial fibrillation: Secondary | ICD-10-CM

## 2023-12-23 DIAGNOSIS — Z7985 Long-term (current) use of injectable non-insulin antidiabetic drugs: Secondary | ICD-10-CM

## 2023-12-23 DIAGNOSIS — E785 Hyperlipidemia, unspecified: Secondary | ICD-10-CM

## 2023-12-23 DIAGNOSIS — E119 Type 2 diabetes mellitus without complications: Secondary | ICD-10-CM

## 2023-12-23 DIAGNOSIS — Z6841 Body Mass Index (BMI) 40.0 and over, adult: Secondary | ICD-10-CM

## 2023-12-23 DIAGNOSIS — E559 Vitamin D deficiency, unspecified: Secondary | ICD-10-CM

## 2023-12-23 DIAGNOSIS — F4329 Adjustment disorder with other symptoms: Secondary | ICD-10-CM

## 2023-12-23 DIAGNOSIS — I152 Hypertension secondary to endocrine disorders: Secondary | ICD-10-CM

## 2023-12-23 DIAGNOSIS — E669 Obesity, unspecified: Secondary | ICD-10-CM

## 2023-12-23 DIAGNOSIS — E1169 Type 2 diabetes mellitus with other specified complication: Secondary | ICD-10-CM

## 2023-12-23 MED ORDER — LOSARTAN POTASSIUM-HCTZ 50-12.5 MG PO TABS
1.0000 | ORAL_TABLET | Freq: Every day | ORAL | 0 refills | Status: AC
  Filled 2023-12-23: qty 90, 90d supply, fill #0

## 2023-12-23 MED ORDER — TIRZEPATIDE 15 MG/0.5ML ~~LOC~~ SOAJ
15.0000 mg | SUBCUTANEOUS | 1 refills | Status: AC
  Filled 2023-12-23 – 2024-02-25 (×2): qty 2, 28d supply, fill #0

## 2024-01-19 NOTE — Progress Notes (Unsigned)
 SUBJECTIVE: Discussed the use of AI scribe software for clinical note transcription with the patient, who gave verbal consent to proceed.  Chief Complaint: Obesity  Interim History: She is up 3 lbs since her last visit.  Down 28 lbs overall.  TBW loss of 11.8%  Linda Olson is here to discuss her progress with her obesity treatment plan. She is on the Category 2 Plan and states she is following her eating plan approximately 0 % of the time. She states she is not exercising .  She has lost insurance coverage and has not taken her Mounjaro  in nearly 3 weeks.   Linda Olson is a 63 year old female with obesity, type 2 diabetes, and paroxysmal atrial fibrillation who presents for follow-up on her obesity treatment plan.  She is experiencing fatigue. She has been off Mounjaro  for three weeks, which she was taking 15 mg once weekly for type 2 diabetes management. During this period, she has experienced increased bowel movements as no longer having slowed gastric emptying effects off Mounjaro .  She recalls being on metformin in the past, taking 1000 mg twice a day, which caused gastrointestinal issues. Her A1c improved from 8.2 in 2022 to 5.8 while on Mounjaro . She has gained some weight recently, increasing from 95 pounds of adipose mass to 101 pounds of adipose mass while being off Mounjaro . She is trying to be mindful during this period. She is willing to resume metformin at this point until she can secure new insurance in order to resume Mounjaro  as she does not want her A1c and weight to increase in the interim.   Her current medications include losartan -hydrochlorothiazide  50/12.5 mg once daily for hypertension, atorvastatin  40 mg daily for hyperlipidemia, metoprolol  25 mg up to twice daily as needed for heart rate control, cholecalciferol  over the counter once daily for vitamin D  deficiency, and Xarelto  for anticoagulation due to chronic atrial fibrillation.  She is currently facing  financial challenges, including a lack of insurance coverage, which affects her ability to afford medications and medical tests.  She is exploring options for insurance coverage and patient assistance programs.  We discussed exploring Healthcare.gov website for insurance options today.  She is unemployed and receives service pay as her only income. We discussed exploring food resources in her community due to financial constraints. OBJECTIVE: Visit Diagnoses: Problem List Items Addressed This Visit     BMI 40.0-44.9, adult (HCC)   Relevant Medications   metFORMIN (GLUCOPHAGE-XR) 500 MG 24 hr tablet   Hypertension associated with type 2 diabetes mellitus (HCC)   Relevant Medications   metFORMIN (GLUCOPHAGE-XR) 500 MG 24 hr tablet   Vitamin D  deficiency   Obesity, Beginning BMI 46.29   Relevant Medications   metFORMIN (GLUCOPHAGE-XR) 500 MG 24 hr tablet   Diabetes mellitus (HCC) - Primary (Chronic)   Relevant Medications   metFORMIN (GLUCOPHAGE-XR) 500 MG 24 hr tablet   Other Visit Diagnoses       Hyperlipidemia associated with type 2 diabetes mellitus (HCC)       Relevant Medications   metFORMIN (GLUCOPHAGE-XR) 500 MG 24 hr tablet     Obesity Management is ongoing with a focus on weight loss.  She has been off Mounjaro  15 weekly for three weeks due to loss of insurance coverage.  She has lost 28 pounds overall but has gained adipose tissue from 95 to 101 pounds. Visceral adipose tissue has increased, which is concerning for future cardiovascular and renal health and general health. - Focus on portion  control and dietary strategies, especially during Thanksgiving. - Continue efforts to obtain insurance coverage through healthcare dot gov. - Will consider restarting Mounjaro  at a lower dose once insurance is secured.  Type 2 diabetes mellitus Type 2 diabetes management is currently compromised due to being off Mounjaro  for three weeks. A1c was previously reduced from 8.2% to 5.8%  with Mounjaro . There is a risk of hyperglycemia due to the lack of Mounjaro . Metformin was previously used but caused gastrointestinal side effects. - Started metformin extended release 500 mg once daily with food to minimize gastrointestinal side effects. - Monitor for gastrointestinal side effects such as diarrhea and nausea. - Will attempt to restart Mounjaro  once insurance is secured. - Will consider Trulicity under patient assistance program if Mounjaro  is not available for her in the future, but prefer Mounjaro  as she has done very well with this medication.  Hypertension Managed with losartan -hydrochlorothiazide  50/12.5 mg once daily. No SE. Currently has enough medication.  BP Readings from Last 3 Encounters:  01/20/24 122/70  12/23/23 107/71  11/26/23 120/79   Lab Results  Component Value Date   NA 141 09/30/2023   CL 105 09/30/2023   K 4.5 09/30/2023   CO2 19 (L) 09/30/2023   BUN 16 09/30/2023   CREATININE 0.86 09/30/2023   EGFR 77 09/30/2023   CALCIUM  9.5 09/30/2023   ALBUMIN 4.0 09/30/2023   GLUCOSE 92 09/30/2023   Continue to work on nutrition plan to promote weight loss and improve BP control.  Contiunue losartan - hydrochlorothiazide  50/12.5 mg daily  Hyperlipidemia Managed with atorvastatin  40 mg daily. No SE. Has medication currently.  Last lipids Lab Results  Component Value Date   CHOL 185 09/30/2023   HDL 58 09/30/2023   LDLCALC 117 (H) 09/30/2023   TRIG 51 09/30/2023   CHOLHDL 4.3 03/30/2021   Continue to work on nutrition plan -decreasing simple carbohydrates, increasing lean proteins, decreasing saturated fats and cholesterol , avoiding trans fats and exercise as able to promote weight loss, improve lipids and decrease cardiovascular risks. Continue atorvastatin .  Hopefully can resume Mounjaro  in near future.   Paroxysmal atrial fibrillation Managed with Xarelto  for anticoagulation and metoprolol  as needed for heart rate control. She has sufficient  Xarelto  for now.   Vitamin D  deficiency Managed with over-the-counter cholecalciferol  1 tablet daily. Continue OTC vitamin D  as able to plan to recheck levels once has insurance coverage .  Low vitamin D  levels can be associated with adiposity and may result in leptin resistance and weight gain. Also associated with fatigue.  Currently on vitamin D  supplementation without any adverse effects such as nausea, vomiting or muscle weakness.    Vitals Temp: 98.2 F (36.8 C) BP: 122/70 Pulse Rate: 60 SpO2: 98 %   Anthropometric Measurements Height: 5' (1.524 m) Weight: 209 lb (94.8 kg) BMI (Calculated): 40.82 Weight at Last Visit: 206 lb Weight Lost Since Last Visit: 0 Weight Gained Since Last Visit: 3 lb Starting Weight: 237 lb Total Weight Loss (lbs): 28 lb (12.7 kg) Peak Weight: 270 lb   Body Composition  Body Fat %: 48.2 % Fat Mass (lbs): 101 lbs Muscle Mass (lbs): 103 lbs Total Body Water (lbs): 80 lbs Visceral Fat Rating : 16   Other Clinical Data Fasting: Yes Labs: Yes Today's Visit #: 42 Starting Date: 04/28/20     ASSESSMENT AND PLAN:  Diet: Linda Olson is currently in the action stage of change. As such, her goal is to continue with weight loss efforts. She has agreed to Category  2 Plan.  Exercise: Linda Olson has been instructed to try a geriatric exercise plan and that some exercise is better than none for weight loss and overall health benefits.   Behavior Modification:  We discussed the following Behavioral Modification Strategies today: increasing lean protein intake, decreasing simple carbohydrates, increasing vegetables, increase H2O intake, increase high fiber foods, meal planning and cooking strategies, holiday eating strategies, avoiding temptations, and planning for success. We discussed various medication options to help Linda Olson with her weight loss efforts and we both agreed to restart metformin XR 500 mg daily for T2DM until she is able to resume  Mounjaro  in future.  Return in about 4 weeks (around 02/17/2024).Linda Olson She was informed of the importance of frequent follow up visits to maximize her success with intensive lifestyle modifications for her multiple health conditions.  Attestation Statements:   Reviewed by clinician on day of visit: allergies, medications, problem list, medical history, surgical history, family history, social history, and previous encounter notes.   Time spent on visit including pre-visit chart review and post-visit care and charting was 33 minutes.    Linda Isabell, PA-C

## 2024-01-20 ENCOUNTER — Other Ambulatory Visit (HOSPITAL_COMMUNITY): Payer: Self-pay

## 2024-01-20 ENCOUNTER — Encounter (INDEPENDENT_AMBULATORY_CARE_PROVIDER_SITE_OTHER): Payer: Self-pay | Admitting: Physician Assistant

## 2024-01-20 ENCOUNTER — Ambulatory Visit (INDEPENDENT_AMBULATORY_CARE_PROVIDER_SITE_OTHER): Payer: Self-pay | Admitting: Physician Assistant

## 2024-01-20 ENCOUNTER — Encounter (HOSPITAL_COMMUNITY): Payer: Self-pay

## 2024-01-20 VITALS — BP 122/70 | HR 60 | Temp 98.2°F | Ht 60.0 in | Wt 209.0 lb

## 2024-01-20 DIAGNOSIS — Z7984 Long term (current) use of oral hypoglycemic drugs: Secondary | ICD-10-CM

## 2024-01-20 DIAGNOSIS — Z7985 Long-term (current) use of injectable non-insulin antidiabetic drugs: Secondary | ICD-10-CM

## 2024-01-20 DIAGNOSIS — E669 Obesity, unspecified: Secondary | ICD-10-CM

## 2024-01-20 DIAGNOSIS — E1159 Type 2 diabetes mellitus with other circulatory complications: Secondary | ICD-10-CM

## 2024-01-20 DIAGNOSIS — E1169 Type 2 diabetes mellitus with other specified complication: Secondary | ICD-10-CM

## 2024-01-20 DIAGNOSIS — Z6841 Body Mass Index (BMI) 40.0 and over, adult: Secondary | ICD-10-CM

## 2024-01-20 DIAGNOSIS — I152 Hypertension secondary to endocrine disorders: Secondary | ICD-10-CM

## 2024-01-20 DIAGNOSIS — E785 Hyperlipidemia, unspecified: Secondary | ICD-10-CM

## 2024-01-20 DIAGNOSIS — R5383 Other fatigue: Secondary | ICD-10-CM

## 2024-01-20 DIAGNOSIS — I480 Paroxysmal atrial fibrillation: Secondary | ICD-10-CM

## 2024-01-20 DIAGNOSIS — E559 Vitamin D deficiency, unspecified: Secondary | ICD-10-CM

## 2024-01-20 MED ORDER — METFORMIN HCL ER 500 MG PO TB24
500.0000 mg | ORAL_TABLET | Freq: Every day | ORAL | 1 refills | Status: AC
  Filled 2024-01-20: qty 30, 30d supply, fill #0
  Filled 2024-02-25: qty 30, 30d supply, fill #1

## 2024-02-25 ENCOUNTER — Other Ambulatory Visit (HOSPITAL_COMMUNITY): Payer: Self-pay

## 2024-02-25 ENCOUNTER — Ambulatory Visit (INDEPENDENT_AMBULATORY_CARE_PROVIDER_SITE_OTHER): Payer: Self-pay | Admitting: Physician Assistant

## 2024-03-11 ENCOUNTER — Telehealth: Payer: Self-pay | Admitting: Cardiovascular Disease

## 2024-03-11 ENCOUNTER — Telehealth: Payer: Self-pay | Admitting: Pharmacy Technician

## 2024-03-11 DIAGNOSIS — I480 Paroxysmal atrial fibrillation: Secondary | ICD-10-CM

## 2024-03-11 NOTE — Telephone Encounter (Signed)
 Mailed xaralto application 03/11/24

## 2024-03-11 NOTE — Telephone Encounter (Signed)
 Pt c/o medication issue:  1. Name of Medication:   rivaroxaban  (XARELTO ) 20 MG TABS tablet    2. How are you currently taking this medication (dosage and times per day)? Take 1 tablet (20 mg total) by mouth daily with supper.   3. Are you having a reaction (difficulty breathing--STAT)? no  4. What is your medication issue? Pt has lost her job and insurance and wants to know if there is an assistance that she can get

## 2024-03-13 ENCOUNTER — Other Ambulatory Visit (HOSPITAL_COMMUNITY): Payer: Self-pay

## 2024-03-13 ENCOUNTER — Telehealth: Payer: Self-pay | Admitting: Licensed Clinical Social Worker

## 2024-03-13 MED ORDER — RIVAROXABAN 20 MG PO TABS
20.0000 mg | ORAL_TABLET | Freq: Every day | ORAL | 0 refills | Status: DC
  Filled 2024-03-13: qty 30, 30d supply, fill #0

## 2024-03-13 NOTE — Telephone Encounter (Signed)
 H&V Care Navigation CSW Progress Note  Clinical Social Worker contacted pt by MyChart to ensure she receives information about Medicaid satellite office and encouraging her to apply. Also inquired about any additional resources she may be interested in. Will f/u next week if no response to further check in.   Patient is participating in a Managed Medicaid Plan:  no, self pay only  SDOH Screenings   Social Connections: Unknown (08/04/2022)   Received from [facility]  Tobacco Use: Low Risk (01/20/2024)    Marit Lark, MSW, LCSW Clinical Social Worker II [facility] Health Heart/Vascular Care Navigation  [phone]- work cell phone (preferred)

## 2024-03-13 NOTE — Telephone Encounter (Addendum)
 The patient took her last xarelto  on 03/11/24. She does not have insurance. We have sent her the assistance application to try to get assistance through [employer] -she should be approved. She is wondering if she can get samples to hold her until this is approved   Update -patient getting free 30 days on card at [facility]

## 2024-03-13 NOTE — Telephone Encounter (Addendum)
 Hi, this patient is uninsured and is currently unemployed. I am sending to you to see if there is any assistance for her in general to get her through this time. She is available on the phone or mychart. Thank you

## 2024-03-31 ENCOUNTER — Telehealth: Payer: Self-pay | Admitting: Licensed Clinical Social Worker

## 2024-03-31 ENCOUNTER — Other Ambulatory Visit (HOSPITAL_COMMUNITY): Payer: Self-pay

## 2024-03-31 NOTE — Telephone Encounter (Signed)
 Xarelto  is going through insurance     Sent message to see where she wants prescription sent

## 2024-03-31 NOTE — Telephone Encounter (Signed)
 H&V Care Navigation CSW Progress Note  Clinical Social Worker contacted patient by phone to f/u on concerns related to insurance/finances from pharmacy assistance team. No answer to MyChart and no answer today at [phone]. I do note that pharmacy benefits still populating at this time.  Patient is participating in a Managed Medicaid Plan:  No, commercial insurance only  SDOH Screenings   Social Connections: Unknown (08/04/2022)   Received from [facility]  Tobacco Use: Low Risk (01/20/2024)    Marit Lark, MSW, LCSW Clinical Social Worker II [facility] Health Heart/Vascular Care Navigation  [phone]- work cell phone (preferred)

## 2024-04-01 ENCOUNTER — Telehealth: Payer: Self-pay | Admitting: Licensed Clinical Social Worker

## 2024-04-01 NOTE — Telephone Encounter (Signed)
 H&V Care Navigation CSW Progress Note  Clinical Social Worker contacted patient by phone to f/u on concerns related to insurance/finances from pharmacy assistance team. No answer to MyChart and no answer again today at [phone]. I do note that pharmacy benefits still populating at this time. I have left two voice mails and sent a MyChart without response. Remain available as needed should pt return my calls.  Patient is participating in a Managed Medicaid Plan:  No, BCBS commercial plan  SDOH Screenings   Social Connections: Unknown (08/04/2022)   Received from [facility]  Tobacco Use: Low Risk (01/20/2024)    Marit Lark, MSW, LCSW Clinical Social Worker II [facility] Heart/Vascular Care Navigation  [phone]- work cell phone (preferred)

## 2024-04-02 ENCOUNTER — Other Ambulatory Visit: Payer: Self-pay

## 2024-04-02 ENCOUNTER — Other Ambulatory Visit (HOSPITAL_COMMUNITY): Payer: Self-pay

## 2024-04-02 ENCOUNTER — Telehealth (HOSPITAL_BASED_OUTPATIENT_CLINIC_OR_DEPARTMENT_OTHER): Payer: Self-pay | Admitting: Licensed Clinical Social Worker

## 2024-04-02 ENCOUNTER — Other Ambulatory Visit: Payer: Self-pay | Admitting: Cardiology

## 2024-04-02 DIAGNOSIS — I480 Paroxysmal atrial fibrillation: Secondary | ICD-10-CM

## 2024-04-02 MED ORDER — RIVAROXABAN 20 MG PO TABS
20.0000 mg | ORAL_TABLET | Freq: Every day | ORAL | 5 refills | Status: AC
  Filled 2024-04-02: qty 30, 30d supply, fill #0

## 2024-04-02 NOTE — Progress Notes (Signed)
 " Heart and Vascular Care Navigation  04/02/2024  Linda Olson [birth year] [id number]  Reason for Referral: self pay/medication costs Patient is participating in a Managed Medicaid Plan:Yes- shares [employer] called her  Engaged with patient by telephone for initial visit for Heart and Vascular Care Coordination.                                                                                                   Assessment:                                     LCSW received a call back from pt. Introduced self, role, reason for call. Confirmed full name/DOB and home address. Pt shares she recently lost her employment which had insurance benefits with it. She was able to prepay for most of her bills so is not currently behind on anything but obvious concern about moving forward with limited/no income. She did apply for Medicaid and received a call from [employer] but hasn't received anything in the mail yet. Encouraged her to monitor that but given weather if she needs enrollment information/medicaid ID she may need to call DSS caseworker to provide to check in staff at appts/the pharmacy. We were able to run a test benefit and see that she has $4 copay for her Xarelto , she should contact pharmacy for refill request. Pt also interested in information about assistance with housing costs and food- agreeable to me sending these via mail.   No additional questions at this time but encouraged her to let us  know if needed.    HRT/VAS Care Coordination     Patients Home Cardiology Office [facility]   Outpatient Care Team Social Worker   Social Worker Name: Marit Lark, [place], [phone]   Living arrangements for the past 2 months Single Family Home   Lives with: Self   Patient Current Insurance Coverage Medicaid   Patient Has Concern With Paying Medical Bills No   Does Patient Have Prescription Coverage? Yes       Social History:                                                                              SDOH Screenings   Food Insecurity: Food Insecurity Present (04/02/2024)  Housing: Low Risk (04/02/2024)  Transportation Needs: No Transportation Needs (04/02/2024)  Utilities: Not At Risk (04/02/2024)  Financial Resource Strain: Medium Risk (04/02/2024)  Social Connections: Unknown (08/04/2022)   Received from [facility]  Tobacco Use: Low Risk (01/20/2024)  Health Literacy: Adequate Health Literacy (04/02/2024)    SDOH Interventions: Financial Resources:  Surveyor, Quantity Strain Interventions: Programmer, Applications Provided DSS for financial assistance  Food Insecurity:  Food Insecurity Interventions: [employer] Provided  Housing Insecurity:  Housing Interventions: Scientist, Physiological:   Transportation Interventions: Intervention Not Indicated     Other Care Navigation Interventions:     Provided Pharmacy assistance resources  Discussed pharmacy benefits pulling for pt- likely Medicaid- $4 copay, pt aware   Follow-up plan:   LCSW sent pt my card, information about food resources, rent/utility assistance and information about [employer] since she had shared that she had received a call from them stating they were sending her a member packet. Encouraged her to call us  as needed moving forward and if she doesn't receive information about Medicaid to reach out to DSS caseworker regarding enrollment information.      "

## 2024-04-03 ENCOUNTER — Other Ambulatory Visit (HOSPITAL_COMMUNITY): Payer: Self-pay

## 2024-04-15 ENCOUNTER — Ambulatory Visit (INDEPENDENT_AMBULATORY_CARE_PROVIDER_SITE_OTHER): Payer: Self-pay | Admitting: Physician Assistant
